# Patient Record
Sex: Male | Born: 1935 | Race: White | Hispanic: No | Smoking: Never smoker
Health system: Southern US, Community
[De-identification: ages and names within clinical notes are randomized; demographics above are authoritative.]

## PROBLEM LIST (undated history)

## (undated) DIAGNOSIS — D649 Anemia, unspecified: Secondary | ICD-10-CM

## (undated) DIAGNOSIS — T7840XA Allergy, unspecified, initial encounter: Secondary | ICD-10-CM

## (undated) DIAGNOSIS — Z87442 Personal history of urinary calculi: Secondary | ICD-10-CM

## (undated) DIAGNOSIS — H3530 Unspecified macular degeneration: Secondary | ICD-10-CM

## (undated) DIAGNOSIS — N419 Inflammatory disease of prostate, unspecified: Secondary | ICD-10-CM

## (undated) DIAGNOSIS — M1990 Unspecified osteoarthritis, unspecified site: Secondary | ICD-10-CM

## (undated) DIAGNOSIS — I493 Ventricular premature depolarization: Secondary | ICD-10-CM

## (undated) DIAGNOSIS — K5732 Diverticulitis of large intestine without perforation or abscess without bleeding: Secondary | ICD-10-CM

## (undated) DIAGNOSIS — I119 Hypertensive heart disease without heart failure: Secondary | ICD-10-CM

## (undated) DIAGNOSIS — Z8619 Personal history of other infectious and parasitic diseases: Secondary | ICD-10-CM

## (undated) DIAGNOSIS — I10 Essential (primary) hypertension: Secondary | ICD-10-CM

## (undated) DIAGNOSIS — C801 Malignant (primary) neoplasm, unspecified: Secondary | ICD-10-CM

## (undated) DIAGNOSIS — R011 Cardiac murmur, unspecified: Secondary | ICD-10-CM

## (undated) DIAGNOSIS — Z8719 Personal history of other diseases of the digestive system: Secondary | ICD-10-CM

## (undated) DIAGNOSIS — K56609 Unspecified intestinal obstruction, unspecified as to partial versus complete obstruction: Secondary | ICD-10-CM

## (undated) DIAGNOSIS — I951 Orthostatic hypotension: Secondary | ICD-10-CM

## (undated) DIAGNOSIS — N189 Chronic kidney disease, unspecified: Secondary | ICD-10-CM

## (undated) DIAGNOSIS — Z85828 Personal history of other malignant neoplasm of skin: Secondary | ICD-10-CM

## (undated) DIAGNOSIS — K5730 Diverticulosis of large intestine without perforation or abscess without bleeding: Secondary | ICD-10-CM

## (undated) DIAGNOSIS — B029 Zoster without complications: Secondary | ICD-10-CM

## (undated) DIAGNOSIS — R079 Chest pain, unspecified: Secondary | ICD-10-CM

## (undated) DIAGNOSIS — R1032 Left lower quadrant pain: Secondary | ICD-10-CM

## (undated) DIAGNOSIS — R002 Palpitations: Secondary | ICD-10-CM

## (undated) DIAGNOSIS — A0471 Enterocolitis due to Clostridium difficile, recurrent: Secondary | ICD-10-CM

## (undated) DIAGNOSIS — I351 Nonrheumatic aortic (valve) insufficiency: Secondary | ICD-10-CM

## (undated) DIAGNOSIS — H3560 Retinal hemorrhage, unspecified eye: Secondary | ICD-10-CM

## (undated) DIAGNOSIS — K219 Gastro-esophageal reflux disease without esophagitis: Secondary | ICD-10-CM

## (undated) DIAGNOSIS — F32A Depression, unspecified: Secondary | ICD-10-CM

## (undated) DIAGNOSIS — D126 Benign neoplasm of colon, unspecified: Secondary | ICD-10-CM

## (undated) DIAGNOSIS — I491 Atrial premature depolarization: Secondary | ICD-10-CM

## (undated) DIAGNOSIS — E785 Hyperlipidemia, unspecified: Secondary | ICD-10-CM

## (undated) DIAGNOSIS — R739 Hyperglycemia, unspecified: Secondary | ICD-10-CM

## (undated) DIAGNOSIS — R091 Pleurisy: Secondary | ICD-10-CM

## (undated) HISTORY — DX: Benign neoplasm of colon, unspecified: D12.6

## (undated) HISTORY — DX: Diverticulitis of large intestine without perforation or abscess without bleeding: K57.32

## (undated) HISTORY — DX: Depression, unspecified: F32.A

## (undated) HISTORY — DX: Zoster without complications: B02.9

## (undated) HISTORY — PX: OTHER SURGICAL HISTORY: SHX169

## (undated) HISTORY — DX: Hyperlipidemia, unspecified: E78.5

## (undated) HISTORY — DX: Personal history of other diseases of the digestive system: Z87.19

## (undated) HISTORY — DX: Allergy, unspecified, initial encounter: T78.40XA

## (undated) HISTORY — DX: Chronic kidney disease, unspecified: N18.9

## (undated) HISTORY — DX: Inflammatory disease of prostate, unspecified: N41.9

## (undated) HISTORY — PX: COLON SURGERY: SHX602

## (undated) HISTORY — DX: Enterocolitis due to Clostridium difficile, recurrent: A04.71

## (undated) HISTORY — DX: Pleurisy: R09.1

## (undated) HISTORY — DX: Atrial premature depolarization: I49.1

## (undated) HISTORY — DX: Palpitations: R00.2

## (undated) HISTORY — PX: LITHOTRIPSY: SUR834

## (undated) HISTORY — DX: Retinal hemorrhage, unspecified eye: H35.60

## (undated) HISTORY — PX: TONSILLECTOMY: SUR1361

## (undated) HISTORY — DX: Nonrheumatic aortic (valve) insufficiency: I35.1

## (undated) HISTORY — DX: Hyperglycemia, unspecified: R73.9

## (undated) HISTORY — DX: Essential (primary) hypertension: I10

## (undated) HISTORY — DX: Hypertensive heart disease without heart failure: I11.9

## (undated) HISTORY — DX: Unspecified intestinal obstruction, unspecified as to partial versus complete obstruction: K56.609

## (undated) HISTORY — PX: EYE SURGERY: SHX253

## (undated) HISTORY — DX: Ventricular premature depolarization: I49.3

## (undated) HISTORY — DX: Diverticulosis of large intestine without perforation or abscess without bleeding: K57.30

## (undated) HISTORY — DX: Unspecified osteoarthritis, unspecified site: M19.90

## (undated) HISTORY — PX: COLONOSCOPY: SHX174

## (undated) HISTORY — DX: Left lower quadrant pain: R10.32

## (undated) HISTORY — DX: Orthostatic hypotension: I95.1

## (undated) HISTORY — DX: Chest pain, unspecified: R07.9

---

## 1998-10-08 ENCOUNTER — Other Ambulatory Visit: Admission: RE | Admit: 1998-10-08 | Discharge: 1998-10-08 | Payer: Self-pay | Admitting: Gastroenterology

## 1998-10-08 ENCOUNTER — Encounter (INDEPENDENT_AMBULATORY_CARE_PROVIDER_SITE_OTHER): Payer: Self-pay | Admitting: Specialist

## 1999-03-21 DIAGNOSIS — D126 Benign neoplasm of colon, unspecified: Secondary | ICD-10-CM

## 1999-03-21 HISTORY — DX: Benign neoplasm of colon, unspecified: D12.6

## 1999-11-30 ENCOUNTER — Encounter (INDEPENDENT_AMBULATORY_CARE_PROVIDER_SITE_OTHER): Payer: Self-pay | Admitting: Specialist

## 1999-11-30 ENCOUNTER — Other Ambulatory Visit: Admission: RE | Admit: 1999-11-30 | Discharge: 1999-11-30 | Payer: Self-pay | Admitting: Gastroenterology

## 2001-03-29 ENCOUNTER — Emergency Department (HOSPITAL_COMMUNITY): Admission: EM | Admit: 2001-03-29 | Discharge: 2001-03-30 | Payer: Self-pay | Admitting: Emergency Medicine

## 2002-01-22 ENCOUNTER — Encounter (INDEPENDENT_AMBULATORY_CARE_PROVIDER_SITE_OTHER): Payer: Self-pay | Admitting: *Deleted

## 2002-01-22 ENCOUNTER — Ambulatory Visit (HOSPITAL_BASED_OUTPATIENT_CLINIC_OR_DEPARTMENT_OTHER): Admission: RE | Admit: 2002-01-22 | Discharge: 2002-01-22 | Payer: Self-pay | Admitting: *Deleted

## 2002-04-07 ENCOUNTER — Emergency Department (HOSPITAL_COMMUNITY): Admission: EM | Admit: 2002-04-07 | Discharge: 2002-04-07 | Payer: Self-pay | Admitting: Emergency Medicine

## 2003-03-21 DIAGNOSIS — K5730 Diverticulosis of large intestine without perforation or abscess without bleeding: Secondary | ICD-10-CM

## 2003-03-21 HISTORY — DX: Diverticulosis of large intestine without perforation or abscess without bleeding: K57.30

## 2005-05-08 ENCOUNTER — Ambulatory Visit: Payer: Self-pay | Admitting: Gastroenterology

## 2008-09-12 ENCOUNTER — Emergency Department (HOSPITAL_COMMUNITY): Admission: EM | Admit: 2008-09-12 | Discharge: 2008-09-12 | Payer: Self-pay | Admitting: Emergency Medicine

## 2008-09-13 ENCOUNTER — Emergency Department (HOSPITAL_COMMUNITY): Admission: EM | Admit: 2008-09-13 | Discharge: 2008-09-13 | Payer: Self-pay | Admitting: Emergency Medicine

## 2008-09-17 ENCOUNTER — Inpatient Hospital Stay (HOSPITAL_COMMUNITY): Admission: EM | Admit: 2008-09-17 | Discharge: 2008-09-18 | Payer: Self-pay | Admitting: Emergency Medicine

## 2008-09-18 ENCOUNTER — Encounter: Payer: Self-pay | Admitting: Urology

## 2010-02-07 ENCOUNTER — Emergency Department (HOSPITAL_COMMUNITY): Admission: EM | Admit: 2010-02-07 | Discharge: 2010-02-07 | Payer: Self-pay | Admitting: Emergency Medicine

## 2010-04-06 ENCOUNTER — Encounter: Payer: Self-pay | Admitting: Gastroenterology

## 2010-04-21 NOTE — Letter (Signed)
Summary: Colonoscopy Letter  [facility]  [address] [place], [place] [address]   Phone: [phone]  Fax: [phone]      April 06, 2010 MRN: [id number]   [facility] [address] [place], [place]  [address]   Dear Mr. NOVOSEL,   According to your medical record, it is time for you to schedule a Colonoscopy. The American Cancer Society recommends this procedure as a method to detect early colon cancer. Patients with a family history of colon cancer, or a personal history of colon polyps or inflammatory bowel disease are at increased risk.  This letter has been generated based on the recommendations made at the time of your procedure. If you feel that in your particular situation this may no longer apply, please contact our office.  Please call our office at [phone] to schedule this appointment or to update your records at your earliest convenience.  Thank you for cooperating with [place] to provide you with the very best care possible.   Sincerely,  Barbette Hair. Arlyce Dice, M.D.  [facility] [phone]

## 2010-05-31 LAB — BASIC METABOLIC PANEL
Calcium: 9 mg/dL (ref 8.4–10.5)
GFR calc Af Amer: >60 mL/min (ref >60)
GFR calc non Af Amer: >60 mL/min (ref >60)
Glucose, Bld: 91 mg/dL (ref 70–99)
Potassium: 3.9 mEq/L (ref 3.5–5.1)
Sodium: 138 mEq/L (ref 135–145)

## 2010-05-31 LAB — DIFFERENTIAL
Basophils Absolute: 0 10*3/uL (ref 0.0–0.1)
Basophils Relative: 1 % (ref 0–1)
Eosinophils Absolute: 0.1 10*3/uL (ref 0.0–0.7)
Monocytes Relative: 6 % (ref 3–12)
Neutro Abs: 5.5 10*3/uL (ref 1.7–7.7)
Neutrophils Relative %: 70 % (ref 43–77)

## 2010-05-31 LAB — CBC
HCT: 42 % (ref 39.0–52.0)
Hemoglobin: 14.5 g/dL (ref 13.0–17.0)
RBC: 4.76 MIL/uL (ref 4.22–5.81)

## 2010-06-26 LAB — BASIC METABOLIC PANEL
BUN: 17 mg/dL (ref 6–23)
CO2: 27 mEq/L (ref 19–32)
Calcium: 8.3 mg/dL — ABNORMAL LOW (ref 8.4–10.5)
Chloride: 102 mEq/L (ref 96–112)
Creatinine, Ser: 1.87 mg/dL — ABNORMAL HIGH (ref 0.4–1.5)
GFR calc Af Amer: 43 mL/min — ABNORMAL LOW (ref >60)
GFR calc non Af Amer: 36 mL/min — ABNORMAL LOW (ref >60)
Glucose, Bld: 114 mg/dL — ABNORMAL HIGH (ref 70–99)
Potassium: 3.8 mEq/L (ref 3.5–5.1)
Sodium: 137 mEq/L (ref 135–145)

## 2010-06-26 LAB — CULTURE, BLOOD (ROUTINE X 2)

## 2010-06-26 LAB — HEMOGLOBIN AND HEMATOCRIT, BLOOD
HCT: 35.9 % — ABNORMAL LOW (ref 39.0–52.0)
Hemoglobin: 12.6 g/dL — ABNORMAL LOW (ref 13.0–17.0)

## 2010-06-27 LAB — URINE MICROSCOPIC-ADD ON

## 2010-06-27 LAB — DIFFERENTIAL
Basophils Absolute: 0.3 10*3/uL — ABNORMAL HIGH (ref 0.0–0.1)
Eosinophils Absolute: 0.2 10*3/uL (ref 0.0–0.7)
Lymphocytes Relative: 7 % — ABNORMAL LOW (ref 12–46)
Lymphs Abs: 0.6 10*3/uL — ABNORMAL LOW (ref 0.7–4.0)
Lymphs Abs: 2.8 10*3/uL (ref 0.7–4.0)
Monocytes Relative: 8 % (ref 3–12)
Neutro Abs: 8.1 10*3/uL — ABNORMAL HIGH (ref 1.7–7.7)
Neutro Abs: 3.8 10*3/uL (ref 1.7–7.7)
Neutrophils Relative %: 51 % (ref 43–77)

## 2010-06-27 LAB — CBC
Hemoglobin: 14.3 g/dL (ref 13.0–17.0)
MCV: 91.2 fL (ref 78.0–100.0)
Platelets: 139 10*3/uL — ABNORMAL LOW (ref 150–400)
RBC: 5.11 MIL/uL (ref 4.22–5.81)
RDW: 13.4 % (ref 11.5–15.5)
WBC: 9.6 10*3/uL (ref 4.0–10.5)
WBC: 7.4 10*3/uL (ref 4.0–10.5)

## 2010-06-27 LAB — PROTIME-INR
INR: 1 (ref 0.00–1.49)
Prothrombin Time: 13.5 seconds (ref 11.6–15.2)

## 2010-06-27 LAB — URINALYSIS, ROUTINE W REFLEX MICROSCOPIC
Glucose, UA: NEGATIVE mg/dL
Leukocytes, UA: NEGATIVE
Nitrite: NEGATIVE
Protein, ur: NEGATIVE mg/dL
Specific Gravity, Urine: 1.024 (ref 1.005–1.030)
Specific Gravity, Urine: 1.021 (ref 1.005–1.030)
Urobilinogen, UA: 1 mg/dL (ref 0.0–1.0)

## 2010-06-27 LAB — POCT I-STAT, CHEM 8
Calcium, Ion: 1.09 mmol/L — ABNORMAL LOW (ref 1.12–1.32)
Chloride: 102 mEq/L (ref 96–112)
HCT: 41 % (ref 39.0–52.0)
Potassium: 3.9 mEq/L (ref 3.5–5.1)
Sodium: 140 mEq/L (ref 135–145)

## 2010-06-27 LAB — URINE CULTURE

## 2010-06-27 LAB — BASIC METABOLIC PANEL
Chloride: 100 mEq/L (ref 96–112)
Creatinine, Ser: 1.07 mg/dL (ref 0.4–1.5)
GFR calc Af Amer: >60 mL/min (ref >60)
Sodium: 142 mEq/L (ref 135–145)

## 2010-08-02 NOTE — Op Note (Signed)
NAMEALLIN, FRIX               ACCOUNT NO.:  [id number]   MEDICAL RECORD NO.:  [id number]          PATIENT TYPE:  INP   LOCATION:  1401                         FACILITY:  [facility]   PHYSICIAN:  Mark C. Vernie Ammons, M.D.  DATE OF BIRTH:  October 31, 1933   DATE OF PROCEDURE:  09/18/2008  DATE OF DISCHARGE:  09/18/2008                               OPERATIVE REPORT   PREOPERATIVE DIAGNOSIS:  Right ureteral calculus.   POSTOPERATIVE DIAGNOSIS:  Right ureteral calculus.   PROCEDURE:  1. Right ureteroscopy.  2. Right ureteral stone extraction.  3. Right double-J stent placement.   SURGEON:  Mark C. Vernie Ammons, M.D.   Threasa HeadsPeggye Pitt.   ANESTHESIA:  General.   BLOOD LOSS:  Less than 5 mL.   DRAINS/:  A 6-French, 24-cm Polaris stent with string placed in the  right ureter.   SPECIMENS:  Stone given to the patient.   COMPLICATIONS:  None.   INDICATIONS:  The patient is a 77 year old male who had a stone located  at the level of the L3 vertebral body for 2 weeks without significant  progression.  He was seen in my office and scheduled for ureteroscopy,  but the day that he was scheduled for that he developed acute renal  colic and required admission to the hospital for pain control.  He  remained in the hospital overnight and was discharged so that he could  come and have his stone removed.  During his hospitalization he did have  a temperature 102 however, his urine was completely clear of any white  cells on admission.  He did receive broad spectrum antibiotics and I  have discussed the procedure with him, as well as its risks,  complications and alternatives.  He understands and has elected to  proceed.   DESCRIPTION OF OPERATION:  After informed consent, the patient was  brought to the major OR, placed on the table, administered general  anesthesia then moved to the dorsal lithotomy position.  His genitalia  were sterilely prepped and draped.  An official time-out was then  performed.   A 22-French cystoscope was then passed with a 12 degrees lens per  urethra which was noted to be entirely normal down to the sphincter  which appears intact.  The prostatic urethra revealed elongation of the  prostatic urethra and hypertrophy of the lateral lobes, as well as a  large median lobe component.  There were no prostatic urethral lesions  identified.  Upon entering the bladder, it was fully and systematically  inspected and no tumor, stones or inflammatory lesions were identified.  The left orifice was identified and appeared normal.  The right orifice  appeared mounded up somewhat and slightly edematous.   The right ureteral orifice was identified and a 6-French open-ended  ureteral catheter was passed through the cystoscope and into the  orifice, but I could not get it passed what appeared to be an  obstructing stone in the intramural ureter.  I tried to pass the  Glidewire and was unable to get this to go passed the stone.   The cystoscope was removed  and the 6-French rigid ureteroscope was then  passed into the bladder and was placed at the right ureteral orifice  opening.  I then passed the guidewire through the ureteroscope and was  easily able to pass this on up into the area of the renal pelvis under  direct fluoroscopic control.   The ureteroscope was removed and the inner portion of the ureteral  access sheath was passed over the guidewire to dilate the intramural  ureter.  I then removed that and left the guidewire in place and next to  the guidewire I passed the 6-French rigid ureteroscope and was able to  identify the stone in the distal ureter.  It was grasped with a nitinol  basket and extracted.   The cystoscope was then back loaded over the guidewire and the stent was  then passed over the guidewire with the guidewire being removed with  good curl being noted in the renal pelvis.  The string was left affixed  to the stent and affixed to the  dorsum of the penis.  The patient  received a B and O suppository and was awakened and taken to the  recovery room in stable and satisfactory condition.  He tolerated the  procedure well and there were no intraoperative complications.   He will be given a prescription for Tylox #36 and Cipro 500 mg b.i.d.  #14.  He will return to my office on October 05, 2008, for stent removal  and written instructions were given upon discharge.      Mark C. Vernie Ammons, M.D.  Electronically Signed     MCO/MEDQ  D:  09/18/2008  T:  09/18/2008  Job:  [id number]

## 2010-08-05 NOTE — Op Note (Signed)
NAME:  Joel Bauer, Joel Bauer                         ACCOUNT NO.:  [id number]   MEDICAL RECORD NO.:  [id number]                   PATIENT TYPE:  AMB   LOCATION:  DSC                                  FACILITY:  [facility]   PHYSICIAN:  Lowell Bouton, M.D.      DATE OF BIRTH:  1933-11-01   DATE OF PROCEDURE:  01/22/2002  DATE OF DISCHARGE:                                 OPERATIVE REPORT   PREOPERATIVE DIAGNOSIS:  Dupuytren's contracture, right ring finger.   POSTOPERATIVE DIAGNOSIS:  Dupuytren's contracture, right ring finger.   OPERATION PERFORMED:  Release of Dupuytren's contracture, right ring finger.   SURGEON:  Lowell Bouton, M.D.   ANESTHESIA:  General.   OPERATIVE FINDINGS:  The patient had a midline longitudinal cord with a  large nodule over the volar aspect of the MP joint.  The cord extended out  to just beyond the PIP joint.  The contracture at the MP was about 45  degrees and at the PIP was approximately 15 degrees.  There were some  retrovascular cords out distally.   DESCRIPTION OF PROCEDURE:  Under general anesthesia with a tourniquet on the  right arm, the right hand was prepped and draped in the usual fashion.  After exsanguinating the limb, the tourniquet was inflated to 250 mmHg.  A  Brunner zigzag incision was made from DIP crease back to the proximal palm  in line with the ring finger.  The dissection was begun proximally and the  flaps were elevated from the cord sharply.  4-0 silk retention sutures were  inserted for retraction.  The midline cord was then transected at the  proximal palm and then grasped with an Allis clamp.  It was then traced  distally out to the MP joint.  The neurovascular bundles were identified  radially and ulnarly and protected.  The flaps were then elevated more  distally after dissecting the nerves completely away from the cord.  The  cord extended over toward the middle finger but did not involve the middle  finger.  It went to the web between the third and fourth digit.  After  completely dissecting the cord out, it was removed and then the  retrovascular cords were removed both radially and ulnarly at the PIP level.  The wound was then irrigated copiously with saline.  The finger appeared to  be straight.  The skin was closed over a vessel loop drain using 4-0  nylon sutures.  Sterile dressings were applied followed by a volar splint  with the fingers in full extension. The patient tolerated the procedure  well.  0.5% Marcaine local anesthesia was inserted for pain control  postoperatively.  He went to the recovery room awake and stable in good  condition.  Lowell Bouton, M.D.    EMM/MEDQ  D:  01/22/2002  T:  01/23/2002  Job:  [id number]   cc:   Elvina Sidle, M.D.  [address] [place]  [place] [address]  Fax: [phone]

## 2011-05-03 ENCOUNTER — Other Ambulatory Visit: Payer: Self-pay | Admitting: Dermatology

## 2011-12-04 ENCOUNTER — Encounter: Payer: Self-pay | Admitting: Gastroenterology

## 2012-06-24 ENCOUNTER — Telehealth: Payer: Self-pay | Admitting: Family Medicine

## 2012-06-24 MED ORDER — LOSARTAN POTASSIUM 100 MG PO TABS: 100.0000 mg | ORAL_TABLET | Freq: Every day | ORAL | Status: DC

## 2012-06-24 NOTE — Telephone Encounter (Signed)
rx refilled.

## 2012-09-10 ENCOUNTER — Ambulatory Visit (INDEPENDENT_AMBULATORY_CARE_PROVIDER_SITE_OTHER): Payer: Medicare Other | Admitting: Family Medicine

## 2012-09-10 ENCOUNTER — Encounter: Payer: Self-pay | Admitting: Family Medicine

## 2012-09-10 VITALS — BP 138/80 | HR 58 | Temp 97.3°F | Resp 16 | Ht 71.0 in | Wt 173.0 lb

## 2012-09-10 DIAGNOSIS — K5732 Diverticulitis of large intestine without perforation or abscess without bleeding: Secondary | ICD-10-CM

## 2012-09-10 DIAGNOSIS — R109 Unspecified abdominal pain: Secondary | ICD-10-CM

## 2012-09-10 DIAGNOSIS — K5792 Diverticulitis of intestine, part unspecified, without perforation or abscess without bleeding: Secondary | ICD-10-CM | POA: Insufficient documentation

## 2012-09-10 LAB — CBC W/MCH & 3 PART DIFF
Hemoglobin: 15.5 g/dL (ref 13.0–17.0)
Lymphs Abs: 1.5 10*3/uL (ref 0.7–4.0)
MCH: 31.5 pg (ref 26.0–34.0)
MCV: 89.2 fL (ref 78.0–100.0)
Neutrophils Relative %: 74 % (ref 43–77)
RBC: 4.92 MIL/uL (ref 4.22–5.81)
WBC mixed population %: 8 % (ref 3–18)

## 2012-09-10 LAB — URINALYSIS, ROUTINE W REFLEX MICROSCOPIC
Bilirubin Urine: NEGATIVE
Glucose, UA: NEGATIVE mg/dL
Ketones, ur: NEGATIVE mg/dL
Specific Gravity, Urine: >1.03 — ABNORMAL HIGH (ref 1.005–1.030)
Urobilinogen, UA: 0.2 mg/dL (ref 0.0–1.0)

## 2012-09-10 LAB — COMPREHENSIVE METABOLIC PANEL
AST: 20 U/L (ref 0–37)
Albumin: 4.2 g/dL (ref 3.5–5.2)
Alkaline Phosphatase: 72 U/L (ref 39–117)
BUN: 17 mg/dL (ref 6–23)
Potassium: 4.5 mEq/L (ref 3.5–5.3)
Sodium: 141 mEq/L (ref 135–145)

## 2012-09-10 MED ORDER — METRONIDAZOLE 500 MG PO TABS: 500.0000 mg | ORAL_TABLET | Freq: Three times a day (TID) | ORAL | Status: DC

## 2012-09-10 MED ORDER — CIPROFLOXACIN HCL 500 MG PO TABS: 500.0000 mg | ORAL_TABLET | Freq: Two times a day (BID) | ORAL | Status: DC

## 2012-09-10 NOTE — Assessment & Plan Note (Signed)
We'll treat for flare of diverticulitis based on worsening abdominal pain and patient's history and symptoms. Urinalysis was negative CBC was done in-house was unremarkable metabolic panel is still pending. I will start him on Cipro Flagyl and given handout. Liquid diet for next 2448 hours we'll see if he improves consider doing CT scan if worsens

## 2012-09-10 NOTE — Progress Notes (Signed)
  Subjective:    Patient ID: Joel Bauer, male    DOB: 07/01/1933, 79 y.o.   MRN: [id number]  HPI  Progressively worsening Abdominal pain for the past week, mostly in lower quadrants, no radiation, no relieving factors, no OTC medication tried.  denies dysuria, Nausea/vomiting, change in stools. Denies blood in stools.  History of diverticulitis years ago, states it feels similar.   Review of Systems  - per above  GEN- denies fatigue, fever, weight loss,weakness, recent illness HEENT- denies eye drainage, change in vision, nasal discharge, CVS- denies chest pain, palpitations RESP- denies SOB, cough, wheeze ABD- denies N/V, change in stools,+ abd pain GU- denies dysuria, hematuria, dribbling, incontinence        Objective:   Physical Exam  GEN- NAD, alert and oriented x3 HEENT- PERRL, EOMI, non injected sclera, pink conjunctiva, MMM, oropharynx clear CVS- RRR, no murmur RESP-CTAB ABD-NABS,soft,Mild TTP lower quandrants, TTP suprapubic region, no rebound, no guarding, no CVA tenderness EXT- No edema Pulses- Radial, DP- 2+       Assessment & Plan:

## 2012-09-10 NOTE — Patient Instructions (Addendum)
Drink fluids We will call with results of labs Antibiotics given Diverticulitis A diverticulum is a small pouch or sac on the colon. Diverticulosis is the presence of these diverticula on the colon. Diverticulitis is the irritation (inflammation) or infection of diverticula. CAUSES  The colon and its diverticula contain bacteria. If food particles block the tiny opening to a diverticulum, the bacteria inside can grow and cause an increase in pressure. This leads to infection and inflammation and is called diverticulitis. SYMPTOMS   Abdominal pain and tenderness. Usually, the pain is located on the left side of your abdomen. However, it could be located elsewhere.  Fever.  Bloating.  Feeling sick to your stomach (nausea).  Throwing up (vomiting).  Abnormal stools. DIAGNOSIS  Your caregiver will take a history and perform a physical exam. Since many things can cause abdominal pain, other tests may be necessary. Tests may include:  Blood tests.  Urine tests.  X-ray of the abdomen.  CT scan of the abdomen. Sometimes, surgery is needed to determine if diverticulitis or other conditions are causing your symptoms. TREATMENT  Most of the time, you can be treated without surgery. Treatment includes:  Resting the bowels by only having liquids for a few days. As you improve, you will need to eat a low-fiber diet.  Intravenous (IV) fluids if you are losing body fluids (dehydrated).  Antibiotic medicines that treat infections may be given.  Pain and nausea medicine, if needed.  Surgery if the inflamed diverticulum has burst. HOME CARE INSTRUCTIONS   Try a clear liquid diet (broth, tea, or water for as long as directed by your caregiver). You may then gradually begin a low-fiber diet as tolerated.  A low-fiber diet is a diet with less than 10 grams of fiber. Choose the foods below to reduce fiber in the diet:  White breads, cereals, rice, and pasta.  Cooked fruits and vegetables  or soft fresh fruits and vegetables without the skin.  Ground or well-cooked tender beef, ham, veal, lamb, pork, or poultry.  Eggs and seafood.  After your diverticulitis symptoms have improved, your caregiver may put you on a high-fiber diet. A high-fiber diet includes 14 grams of fiber for every 1000 calories consumed. For a standard 2000 calorie diet, you would need 28 grams of fiber. Follow these diet guidelines to help you increase the fiber in your diet. It is important to slowly increase the amount fiber in your diet to avoid gas, constipation, and bloating.  Choose whole-grain breads, cereals, pasta, and brown rice.  Choose fresh fruits and vegetables with the skin on. Do not overcook vegetables because the more vegetables are cooked, the more fiber is lost.  Choose more nuts, seeds, legumes, dried peas, beans, and lentils.  Look for food products that have greater than 3 grams of fiber per serving on the Nutrition Facts label.  Take all medicine as directed by your caregiver.  If your caregiver has given you a follow-up appointment, it is very important that you go. Not going could result in lasting (chronic) or permanent injury, pain, and disability. If there is any problem keeping the appointment, call to reschedule. SEEK MEDICAL CARE IF:   Your pain does not improve.  You have a hard time advancing your diet beyond clear liquids.  Your bowel movements do not return to normal. SEEK IMMEDIATE MEDICAL CARE IF:   Your pain becomes worse.  You have an oral temperature above 102 F (38.9 C), not controlled by medicine.  You have  repeated vomiting.  You have bloody or black, tarry stools.  Symptoms that brought you to your caregiver become worse or are not getting better. MAKE SURE YOU:   Understand these instructions.  Will watch your condition.  Will get help right away if you are not doing well or get worse. Document Released: 12/14/2004 Document Revised:  05/29/2011 Document Reviewed: 04/11/2010 [facility] Patient Information 2014 [place], [place].

## 2012-09-16 ENCOUNTER — Encounter: Payer: Self-pay | Admitting: Family Medicine

## 2012-09-16 ENCOUNTER — Ambulatory Visit (INDEPENDENT_AMBULATORY_CARE_PROVIDER_SITE_OTHER): Payer: Medicare Other | Admitting: Family Medicine

## 2012-09-16 VITALS — BP 128/70 | HR 60 | Temp 97.1°F | Resp 16 | Ht 71.0 in | Wt 168.0 lb

## 2012-09-16 DIAGNOSIS — K5792 Diverticulitis of intestine, part unspecified, without perforation or abscess without bleeding: Secondary | ICD-10-CM

## 2012-09-16 DIAGNOSIS — R1032 Left lower quadrant pain: Secondary | ICD-10-CM

## 2012-09-16 DIAGNOSIS — K5732 Diverticulitis of large intestine without perforation or abscess without bleeding: Secondary | ICD-10-CM

## 2012-09-16 NOTE — Patient Instructions (Addendum)
CT scan to be done on Wed  We will call with results

## 2012-09-16 NOTE — Assessment & Plan Note (Signed)
Obtain CT scan, no improvement with antibiotics and change in diet Evaluate for abscess, mass, inflammation etc

## 2012-09-16 NOTE — Progress Notes (Signed)
  Subjective:    Patient ID: Joel Bauer, male    DOB: 03-27-1933, 79 y.o.   MRN: [id number]  HPI  Pt here with continued abdominal pain. He was seen last week secondary to left lower quadrant pain similar to his diverticulitis flare he was started on Cipro and Flagyl he did a liquid diet for 72 hours and slowly added back regular foods however he saw no change with the pain. He had one day where he had no pain however it returned worse last night. Noticed his stools were darker than usual, no BRBPR. Denies vomiting, mild nausea  Review of Systems   GEN- denies fatigue, fever, weight loss,weakness, recent illness HEENT- denies eye drainage, change in vision, nasal discharge, CVS- denies chest pain, palpitations RESP- denies SOB, cough, wheeze ABD- denies N/V, change in stools+, abd pain       Objective:   Physical Exam  GEN- NAD, alert and oriented x3 CVS- RRR, no murmur RESP-CTAB ABD-NABS,soft,TTP LLQ, no rebound, no guarding, no mass felt, no CVA tenderness EXT- No edema Rectum- external skin tags, FOBT neg, prostate smooth, no nodules Pulses- Radial2+       Assessment & Plan:

## 2012-09-16 NOTE — Assessment & Plan Note (Signed)
Continue LLQ abd pain, labs unremarkable but pt not improving, FOBT neg, obtain CT abd/pelvis  Evaluate bowels, oral contrast, complete antibiotics

## 2012-09-18 ENCOUNTER — Ambulatory Visit
Admission: RE | Admit: 2012-09-18 | Discharge: 2012-09-18 | Disposition: A | Discharge status: Home / Self Care | Payer: Medicare Other | Source: Ambulatory Visit | Attending: Family Medicine | Admitting: Family Medicine

## 2012-09-18 DIAGNOSIS — R1032 Left lower quadrant pain: Secondary | ICD-10-CM

## 2012-09-18 MED ORDER — IOHEXOL 300 MG/ML  SOLN
100.0000 mL | Freq: Once | INTRAMUSCULAR | Status: AC | PRN
  Administered 2012-09-18: 100 mL via INTRAVENOUS

## 2012-10-03 ENCOUNTER — Encounter: Payer: Self-pay | Admitting: Nurse Practitioner

## 2012-10-03 ENCOUNTER — Emergency Department (HOSPITAL_COMMUNITY): Payer: PRIVATE HEALTH INSURANCE

## 2012-10-03 ENCOUNTER — Emergency Department (HOSPITAL_COMMUNITY)
Admission: EM | Admit: 2012-10-03 | Discharge: 2012-10-03 | Disposition: A | Discharge status: Home / Self Care | Payer: PRIVATE HEALTH INSURANCE | Attending: Emergency Medicine | Admitting: Emergency Medicine

## 2012-10-03 ENCOUNTER — Encounter (HOSPITAL_COMMUNITY): Payer: Self-pay | Admitting: Cardiology

## 2012-10-03 DIAGNOSIS — Z860100 Personal history of colon polyps, unspecified: Secondary | ICD-10-CM | POA: Insufficient documentation

## 2012-10-03 DIAGNOSIS — E785 Hyperlipidemia, unspecified: Secondary | ICD-10-CM | POA: Insufficient documentation

## 2012-10-03 DIAGNOSIS — K5792 Diverticulitis of intestine, part unspecified, without perforation or abscess without bleeding: Secondary | ICD-10-CM

## 2012-10-03 DIAGNOSIS — K5732 Diverticulitis of large intestine without perforation or abscess without bleeding: Secondary | ICD-10-CM | POA: Insufficient documentation

## 2012-10-03 DIAGNOSIS — I10 Essential (primary) hypertension: Secondary | ICD-10-CM | POA: Insufficient documentation

## 2012-10-03 DIAGNOSIS — Z8719 Personal history of other diseases of the digestive system: Secondary | ICD-10-CM | POA: Insufficient documentation

## 2012-10-03 DIAGNOSIS — Z79899 Other long term (current) drug therapy: Secondary | ICD-10-CM | POA: Insufficient documentation

## 2012-10-03 LAB — CBC WITH DIFFERENTIAL/PLATELET
Hemoglobin: 15.4 g/dL (ref 13.0–17.0)
Lymphs Abs: 0.8 10*3/uL (ref 0.7–4.0)
Monocytes Relative: 7 % (ref 3–12)
Neutro Abs: 7.9 10*3/uL — ABNORMAL HIGH (ref 1.7–7.7)
Neutrophils Relative %: 83 % — ABNORMAL HIGH (ref 43–77)
RBC: 4.88 MIL/uL (ref 4.22–5.81)

## 2012-10-03 LAB — COMPREHENSIVE METABOLIC PANEL
Alkaline Phosphatase: 74 U/L (ref 39–117)
BUN: 17 mg/dL (ref 6–23)
Chloride: 102 mEq/L (ref 96–112)
GFR calc Af Amer: >90 mL/min (ref >90)
Glucose, Bld: 95 mg/dL (ref 70–99)
Potassium: 3.9 mEq/L (ref 3.5–5.1)
Total Bilirubin: 0.6 mg/dL (ref 0.3–1.2)

## 2012-10-03 LAB — URINALYSIS, ROUTINE W REFLEX MICROSCOPIC
Bilirubin Urine: NEGATIVE
Ketones, ur: NEGATIVE mg/dL
Nitrite: NEGATIVE
Urobilinogen, UA: 0.2 mg/dL (ref 0.0–1.0)
pH: 7 (ref 5.0–8.0)

## 2012-10-03 LAB — URINE MICROSCOPIC-ADD ON

## 2012-10-03 LAB — LIPASE, BLOOD: Lipase: 26 U/L (ref 11–59)

## 2012-10-03 MED ORDER — OXYCODONE-ACETAMINOPHEN 5-325 MG PO TABS
1.0000 | ORAL_TABLET | Freq: Once | ORAL | Status: AC
  Administered 2012-10-03: 1 via ORAL
  Filled 2012-10-03: qty 1

## 2012-10-03 MED ORDER — SODIUM CHLORIDE 0.9 % IV BOLUS (SEPSIS)
1000.0000 mL | Freq: Once | INTRAVENOUS | Status: AC
  Administered 2012-10-03: 1000 mL via INTRAVENOUS

## 2012-10-03 MED ORDER — IBUPROFEN 200 MG PO TABS
600.0000 mg | ORAL_TABLET | Freq: Once | ORAL | Status: AC
  Administered 2012-10-03: 600 mg via ORAL
  Filled 2012-10-03: qty 1

## 2012-10-03 MED ORDER — IOHEXOL 300 MG/ML  SOLN
100.0000 mL | Freq: Once | INTRAMUSCULAR | Status: AC | PRN
  Administered 2012-10-03: 100 mL via INTRAVENOUS

## 2012-10-03 MED ORDER — AMOXICILLIN-POT CLAVULANATE 875-125 MG PO TABS: 1.0000 | ORAL_TABLET | Freq: Two times a day (BID) | ORAL | Status: DC

## 2012-10-03 MED ORDER — OXYCODONE-ACETAMINOPHEN 5-325 MG PO TABS: 1.0000 | ORAL_TABLET | Freq: Once | ORAL | Status: DC

## 2012-10-03 MED ORDER — IOHEXOL 300 MG/ML  SOLN
25.0000 mL | INTRAMUSCULAR | Status: AC
  Administered 2012-10-03: 25 mL via ORAL

## 2012-10-03 MED ORDER — METRONIDAZOLE 500 MG PO TABS: 500.0000 mg | ORAL_TABLET | Freq: Two times a day (BID) | ORAL | Status: DC

## 2012-10-03 NOTE — ED Notes (Signed)
Pt reports that he was dx with diverticulitis about 2 weeks ago and placed on a 2 week course of antibiotics. States that he is still experiencing llq pain and radiates into the center of his abd. Denies any urinary symptoms. States he called PCP and told to come here.

## 2012-10-03 NOTE — ED Notes (Signed)
Pt informed, waiting GI consult.

## 2012-10-03 NOTE — ED Notes (Signed)
Ct called to inform that pt is done with contrast 

## 2012-10-03 NOTE — ED Notes (Signed)
Lactic acid results shown to Dr. Yelverton 

## 2012-10-03 NOTE — ED Provider Notes (Signed)
History    CSN: [id number] Arrival date & time 10/03/12  4098  First MD Initiated Contact with Patient 10/03/12 [phone]     Chief Complaint  Patient presents with  . Abdominal Pain   (Consider location/radiation/quality/duration/timing/severity/associated sxs/prior Treatment) Patient is a 79 y.o. male presenting with abdominal pain. The history is provided by the patient.  Abdominal Pain Associated symptoms include abdominal pain. Associated symptoms comments: Joel Bauer is a 79 y.o. male h/o diverticulosis here with persistent abd pain, despite proper abx treatment x 2 weeks.  CT one month ago did not reveal diverticulitis.  Patient is currently denying N/V/D and states he will be getting GI follow up.  His pain was persistently a 6 last night without any pain medication.  He was advised to come to the ED by his PCP for further evaluation.  Patient also denies fever, chills, night sweats, GU complaints or any systemic symptoms.  He has been tolerating PO well.   Past Medical History  Diagnosis Date  . Colon polyp   . Diverticulitis   . Hypertension   . Hyperlipidemia    History reviewed. No pertinent past surgical history. History reviewed. No pertinent family history. History  Substance Use Topics  . Smoking status: Never Smoker   . Smokeless tobacco: Not on file  . Alcohol Use: Not on file    Review of Systems  Gastrointestinal: Positive for abdominal pain.  10 Systems reviewed and are negative for acute change except as noted in the HPI.   Allergies  Review of patient's allergies indicates no known allergies.  Home Medications   Current Outpatient Rx  Name  Route  Sig  Dispense  Refill  . ibuprofen (ADVIL,MOTRIN) 200 MG tablet   Oral   Take 400 mg by mouth every 6 (six) hours as needed for pain.         Marland Kitchen losartan (COZAAR) 100 MG tablet   Oral   Take 1 tablet (100 mg total) by mouth daily.   30 tablet   5    BP 149/78  Pulse 73  Temp(Src) 97.7 F  (36.5 C) (Oral)  Resp 16  SpO2 99% Physical Exam  Nursing note and vitals reviewed. Constitutional: He is oriented to person, place, and time. Vital signs are normal. He appears well-developed and well-nourished.  Non-toxic appearance. He does not appear ill. No distress.  HENT:  Head: Normocephalic and atraumatic.  Nose: Nose normal.  Mouth/Throat: Oropharynx is clear and moist. No oropharyngeal exudate.  Eyes: Conjunctivae and EOM are normal. Pupils are equal, round, and reactive to light. No scleral icterus.  Neck: Normal range of motion. Neck supple. No tracheal deviation, no edema, no erythema and normal range of motion present. No mass and no thyromegaly present.  Cardiovascular: Normal rate, regular rhythm, S1 normal, S2 normal, normal heart sounds, intact distal pulses and normal pulses.  Exam reveals no gallop and no friction rub.   No murmur heard. Pulses:      Radial pulses are 2+ on the right side, and 2+ on the left side.       Dorsalis pedis pulses are 2+ on the right side, and 2+ on the left side.  Pulmonary/Chest: Effort normal and breath sounds normal. No respiratory distress. He has no wheezes. He has no rhonchi. He has no rales.  Abdominal: Soft. Normal appearance and bowel sounds are normal. He exhibits no distension, no ascites and no mass. There is no hepatosplenomegaly. There is no tenderness. There is  no rebound, no guarding and no CVA tenderness.  Genitourinary: Penis normal. No penile tenderness.  No evidence of hernia  Musculoskeletal: Normal range of motion. He exhibits no edema and no tenderness.  Lymphadenopathy:    He has no cervical adenopathy.  Neurological: He is alert and oriented to person, place, and time. He has normal strength. No cranial nerve deficit or sensory deficit. GCS eye subscore is 4. GCS verbal subscore is 5. GCS motor subscore is 6.  Skin: Skin is warm, dry and intact. No petechiae and no rash noted. He is not diaphoretic. No erythema. No  pallor.  Psychiatric: He has a normal mood and affect. His behavior is normal. Judgment normal.    ED Course  Procedures (including critical care time) Labs Reviewed  CBC WITH DIFFERENTIAL - Abnormal; Notable for the following:    Platelets 143 (*)    Neutrophils Relative % 83 (*)    Neutro Abs 7.9 (*)    Lymphocytes Relative 9 (*)    All other components within normal limits  COMPREHENSIVE METABOLIC PANEL - Abnormal; Notable for the following:    GFR calc non Af Amer 85 (*)    All other components within normal limits  URINALYSIS, ROUTINE W REFLEX MICROSCOPIC - Abnormal; Notable for the following:    Hgb urine dipstick TRACE (*)    All other components within normal limits  LIPASE, BLOOD  URINE MICROSCOPIC-ADD ON  CG4 I-STAT (LACTIC ACID)   Ct Abdomen Pelvis W Contrast  10/03/2012   *RADIOLOGY REPORT*  Clinical Data: Abdominal pain.  CT ABDOMEN AND PELVIS WITH CONTRAST  Technique:  Multidetector CT imaging of the abdomen and pelvis was performed following the standard protocol during bolus administration of intravenous contrast.  Contrast: OMNIPAQUE IOHEXOL 300 MG/ML  SOLN  Comparison: 09/18/2012  Findings: No pleural effusion identified.  Dependent changes are noted in both lung bases.  Calcifications within the RCA and LAD coronary artery noted.  Stable enhancing structure within the inferior right hepatic lobe measuring 1.1 cm, image 39/series 2.  Presumed hemangioma is identified along the dome of the liver measuring approximately 1.7 cm, image 20/series 2.  The gallbladder is within normal limits. There is no biliary dilatation.  Normal appearance of the pancreas. The spleen is normal.  The adrenal glands are both within normal limits.  Stone is identified within the lower pole of the right kidney measuring 5 mm, image 46/series 2.  There is a small cyst arising from the upper pole of the right kidney which measures 1.1 cm, image 37/series 2.  The left kidney is normal.  The  urinary bladder appears normal.  There is prostate gland enlargement and this has mass effect upon the bladder base.  Normal caliber of the abdominal aorta.  There is calcified atherosclerotic disease.  No aneurysm.  No upper abdominal adenopathy identified.  There is no pelvic or inguinal adenopathy identified.  The stomach is normal.  The small bowel loops have a normal course and caliber.  The appendix is visualized and appears normal. Normal appearance of the proximal colon.  Extensive diverticulosis involves the sigmoid colon.  There is new wall thickening and inflammatory changes involving the sigmoid colon compatible with acute diverticulitis.  No evidence for bowel perforation or abscess formation.  No significant free fluid or abnormal fluid collections.  Review of the visualized osseous structures is significant for mild multilevel degenerative disc disease.  IMPRESSION:  1.  Exam is positive for acute diverticulitis involving the sigmoid colon.  2.  No complicating features identified.  No evidence for bowel obstruction or abscess formation. 3.  There are two enhancing lesions within the liver parenchyma which are favored to represent hemangiomas. 4.  The nonobstructing renal calculi.   Original Report Authenticated By: Signa Kell, M.D.   No diagnosis found.  Date: 10/03/2012  Rate: 69  Rhythm: normal sinus rhythm  QRS Axis: left  Intervals: normal  ST/T Wave abnormalities: normal  Conduction Disutrbances:none  Narrative Interpretation:   Old EKG Reviewed: none available    MDM  I can not find a CT confirming diverticulitis on this patient, only diverticulosis.  He otherwise appears very well in the room and tolerates PO without any problems.  We will check basic labs, treat his pain with NSAID and oxycodone.  Repeat Ct is warranted as his last one was a month ago to ensure there has not been any progression.  1040 - reeval: patient states his pain remains the same with acute flares  despite pain medication.  He is not requesting more meds at this time when offered.  Currently awaiting CT scan.  1250 - reeval: CT reveals diverticulitis.  Patient just finished 2 week course of cipro flagyl.  Will consult GI for further recs and possible quick outpt eval.  1530n - reeval: I spoke with Gunnar Fusi from GI.  States her attg is still in the OR.  We discussed the pt and agreed to DC home with augmentin/flagyl treatment and follow up this Monday or Tuesday. Patient is amendable with this plan.  Tomasita Crumble, MD 10/03/12 1534

## 2012-10-03 NOTE — ED Provider Notes (Signed)
Flow manager informed me that the patient was unable to fill his prescription, because the pharmacy did not have the resident's DEA number. I discussed this with Dr. Freida Busman, who tells me to write a new prescription and leg at the front desk for patient. A prescription was written for 10 tablets of Percocet 5-325,  Take one tab Q4 hours PRN for pain.  Roxy Horseman, PA-C 10/03/12 1826

## 2012-10-03 NOTE — ED Provider Notes (Signed)
Medical screening examination/treatment/procedure(s) were performed by non-physician practitioner and as supervising physician I was immediately available for consultation/collaboration.  Toy Baker, MD 10/03/12 [phone]

## 2012-10-04 NOTE — ED Provider Notes (Signed)
I saw and evaluated the patient, reviewed the resident's note and I agree with the findings and plan. Pt is well appearing. Normal VS and WBC. +LLQ tenderness. No peritoneal signs. Pt has been on oral abx. Case discussed with GI and will start on augmentin and pt will f/u up in their office early next week to assure resolution. Pt in agreement with plan. Return precautions given.   Loren Racer, MD 10/04/12 [phone]

## 2012-10-11 ENCOUNTER — Encounter: Payer: Self-pay | Admitting: Nurse Practitioner

## 2012-10-11 ENCOUNTER — Ambulatory Visit (INDEPENDENT_AMBULATORY_CARE_PROVIDER_SITE_OTHER): Payer: PRIVATE HEALTH INSURANCE | Admitting: Nurse Practitioner

## 2012-10-11 VITALS — BP 120/64 | HR 76 | Ht 71.0 in | Wt 164.0 lb

## 2012-10-11 DIAGNOSIS — K5732 Diverticulitis of large intestine without perforation or abscess without bleeding: Secondary | ICD-10-CM

## 2012-10-11 DIAGNOSIS — K5792 Diverticulitis of intestine, part unspecified, without perforation or abscess without bleeding: Secondary | ICD-10-CM

## 2012-10-11 MED ORDER — AMOXICILLIN-POT CLAVULANATE 875-125 MG PO TABS: 1.0000 | ORAL_TABLET | Freq: Two times a day (BID) | ORAL | Status: DC

## 2012-10-11 NOTE — Progress Notes (Signed)
HPI :   Patient is a 79 year old male known to Dr. Arlyce Dice. He has a history of adenomatous colon polyps 2001. Last colonoscopy 2005 showed only diverticulosis. Patient saw PCP 09/10/12 for a one week history of lower abdominal pain. Urinalysis and CBC were unremarkable. He was treated with Cipro and Flagyl. His abdominal pain persisted, a CT scan with contrast was done 09/18/12 and revealed diverticulosis without diverticulitis. Patient completed his antibiotics but because of worsening pain presented to the emergency department on 10/03/12. WBC was normal. CT scan of the abdomen and pelvis with contrast was repeated and surprisingly showed acute sigmoid diverticulitis. Patient was given a seven-day course of Augmentin. He is actually feeling better. Last night he had some lower abdominal pain but attributes that to gas no fever or chills. His weight is down a few pounds but he's been on a liquid diet because of recent diverticulitis. Bowel movements are okay, no blood in stool.   Past Medical History  Diagnosis Date  . Colon polyp   . Diverticulitis   . Hypertension   . Hyperlipidemia   . Arthritis   . Glaucoma   . Kidney stones   . Shingles   . Pleurisy   . Prostatitis    Family History  Problem Relation Age of Onset  . Colon cancer Father   . Colon polyps Father   . Breast cancer Mother   . Heart disease Mother   . Kidney disease Sister    History  Substance Use Topics  . Smoking status: Never Smoker   . Smokeless tobacco: Never Used  . Alcohol Use: No   Current Outpatient Prescriptions  Medication Sig Dispense Refill  . ibuprofen (ADVIL,MOTRIN) 200 MG tablet Take 400 mg by mouth every 6 (six) hours as needed for pain.      Marland Kitchen losartan (COZAAR) 100 MG tablet Take 1 tablet (100 mg total) by mouth daily.  30 tablet  5   No current facility-administered medications for this visit.   No Known Allergies  Review of Systems: Positive for hearing problems, vision changes. All other  systems reviewed and negative except where noted in HPI.    Ct Abdomen Pelvis W Contrast  10/03/2012   *RADIOLOGY REPORT*  Clinical Data: Abdominal pain.  CT ABDOMEN AND PELVIS WITH CONTRAST  Technique:  Multidetector CT imaging of the abdomen and pelvis was performed following the standard protocol during bolus administration of intravenous contrast.  Contrast: OMNIPAQUE IOHEXOL 300 MG/ML  SOLN  Comparison: 09/18/2012  Findings: No pleural effusion identified.  Dependent changes are noted in both lung bases.  Calcifications within the RCA and LAD coronary artery noted.  Stable enhancing structure within the inferior right hepatic lobe measuring 1.1 cm, image 39/series 2.  Presumed hemangioma is identified along the dome of the liver measuring approximately 1.7 cm, image 20/series 2.  The gallbladder is within normal limits. There is no biliary dilatation.  Normal appearance of the pancreas. The spleen is normal.  The adrenal glands are both within normal limits.  Stone is identified within the lower pole of the right kidney measuring 5 mm, image 46/series 2.  There is a small cyst arising from the upper pole of the right kidney which measures 1.1 cm, image 37/series 2.  The left kidney is normal.  The urinary bladder appears normal.  There is prostate gland enlargement and this has mass effect upon the bladder base.  Normal caliber of the abdominal aorta.  There is calcified atherosclerotic disease.  No aneurysm.  No upper abdominal adenopathy identified.  There is no pelvic or inguinal adenopathy identified.  The stomach is normal.  The small bowel loops have a normal course and caliber.  The appendix is visualized and appears normal. Normal appearance of the proximal colon.  Extensive diverticulosis involves the sigmoid colon.  There is new wall thickening and inflammatory changes involving the sigmoid colon compatible with acute diverticulitis.  No evidence for bowel perforation or abscess formation.   No significant free fluid or abnormal fluid collections.  Review of the visualized osseous structures is significant for mild multilevel degenerative disc disease.  IMPRESSION:  1.  Exam is positive for acute diverticulitis involving the sigmoid colon. 2.  No complicating features identified.  No evidence for bowel obstruction or abscess formation. 3.  There are two enhancing lesions within the liver parenchyma which are favored to represent hemangiomas. 4.  The nonobstructing renal calculi.   Original Report Authenticated By: Signa Kell, M.D.   Physical Exam: BP 120/64  Pulse 76  Ht 5\' 11"  (1.803 m)  Wt 164 lb (74.39 kg)  BMI 22.88 kg/m2 Constitutional: Pleasant,well-developed, white male in no acute distress. HEENT: Normocephalic and atraumatic. Conjunctivae are normal. No scleral icterus. Neck supple.  Cardiovascular: Normal rate, regular rhythm.  Pulmonary/chest: Effort normal and breath sounds normal. No wheezing, rales or rhonchi. Abdominal: Soft, nondistended, mild to moderate left lower quadrant tenderness. Bowel sounds active throughout. There are no masses palpable. No hepatomegaly. Extremities: no edema Lymphadenopathy: No cervical adenopathy noted. Neurological: Alert and oriented to person place and time. Skin: Skin is warm and dry. No rashes noted. Psychiatric: Normal mood and affect. Behavior is normal.   ASSESSMENT AND PLAN: 61. 78 year old male with documented uncomplicated sigmoid diverticulitis on CT scan. He is just finished a week of Augmentin. Though abdominal pain has resolved, patient is still tender in his left lower quadrant. Will extend Augmentin treatment for another 10 days. Patient will return for followup in a couple of weeks. He knows to call in the interim for recurrent abdominal pain and/or fever. For now he should continue a low fiber diet .to clinic  2. history of adenomatous colon polyps in 2001. Last colonoscopy 2005 negative for polyps. Patient prefers  not to pursue surveillance colonoscopy see #3.   3. family history of colon cancer in father (in his sixties). Again, patient is not interested in pursuing another colonoscopy. He has had friends who have suffered complications from a colonoscopy. He asked about options /alternatives. We did talk about virtual colonoscopies and he understands that no polypectomies or biopsies could be done during that procedure. Patient will think about his options and discuss further at followup visit. Colonoscopy is not an option right now anyway given diverticulitis.

## 2012-10-11 NOTE — Patient Instructions (Addendum)
A followup appointment with Willette Cluster, NP in 2 weeks.  Schedule is not out that far, please call next week to schedule.   Call us in the interim if you have recurrent abdominal pain and/or fevers. Low fiber diet until next followup appointment. Augmentin 875 mg twice daily for 10 days will be sent to your pharmacy. CC:  Lynnea Ferrier MD  Low-Fiber Diet Fiber is found in fruits, vegetables, and grains. A low-fiber diet restricts fibrous foods that are not digested in the small intestine. A diet containing about 10 grams of fiber is considered low fiber.  PURPOSE  To prevent blockage of a partially obstructed or narrowed gastrointestinal tract.  To reduce fecal weight and volume.  To slow the movement of feces. WHEN IS THIS DIET USED?  It may be used during the acute phase of Crohn disease, ulcerative colitis, regional enteritis, or diverticulitis.  It may be used if your intestinal or esophageal tubes are narrowing (stenosis).  It may be used as a transitional diet following surgery, injury (trauma), or illness. CHOOSING FOODS Check labels, especially on foods from the starch list. Often times, dietary fiber content is listed on the nutrition facts panel. Please ask your Registered Dietitian if you have questions about specific foods that are related to your condition, especially if the food is not listed on this handout. Breads and Starches  Allowed: White, [place], and pita breads, plain rolls, buns, or sweet rolls, doughnuts, waffles, pancakes, bagels. Plain muffins, biscuits, matzoth. Soda, saltine, graham crackers. Pretzels, rusks, melba toast, zwieback. Cooked cereals: cornmeal, farina, or cream cereals. Dry cereals: refined corn, wheat, rice, and oat cereals (check label). Potatoes prepared any way without skins, refined macaroni, spaghetti, noodles, refined rice.  Avoid: Whole-wheat bread, rolls, and crackers. Multigrains, rye, bran seeds, nuts, or coconut. Cereals containing  whole grains, multigrains, bran, coconut, nuts, raisins. Cooked or dry oatmeal. Coarse wheat cereals, granola. Cereals advertised as "high fiber." Potato skins. Whole-grain pasta, wild or brown rice. Popcorn. Vegetables  Allowed: Strained tomato and vegetable juices. Fresh lettuce, cucumber, spinach. Well-cooked or canned: asparagus, bean sprouts, broccoli, cut green beans, cauliflower, pumpkin, beets, mushrooms, olives, yellow squash, tomato, tomato sauce, zucchini, turnips.Keep servings limited to  cup.  Avoid: Fresh, cooked, or canned: artichokes, baked beans, beet greens, Brussels sprouts, corn, kale, legumes, peas, sweet potatoes. Avoid large servings of any vegetables. Fruit  Allowed: All fruit juices except prune juice. Cooked or canned fruits without skin and seeds: apricots, applesauce, cantaloupe, cherries, grapefruit, grapes, kiwi, mandarin oranges, peaches, pears, fruit cocktail, pineapple, plums, watermelon. Fresh without skin: banana, grapes, cantaloupe, avocado, cherries, pineapple, kiwi, nectarines, peaches, blueberries. Keep servings limited to  cup or 1 piece.  Avoid: Fresh: apples with or without skin, apricots, mangoes, pears, raspberries, strawberries. Prune juice and juices with pulp, stewed or dried prunes. Dried fruits, raisins, dates. Avoid large servings of all fresh fruits. Meat and Protein Substitutes  Allowed: Ground or well-cooked tender beef, ham, veal, lamb, pork, poultry. Eggs, plain cheese. Fish, oysters, shrimp, lobster, other seafood. Liver, organ meats. Smooth nut butters.  Avoid: Tough, fibrous meats with gristle. Chunky nut butter.Cheese with seeds, nuts, or other foods not allowed. Nuts, seeds, legumes, dried peas, beans, lentils. Dairy  Allowed: All milk products except those not allowed.  Avoid: Yogurt or cheese that contains nuts, seeds, or added fruit. Soups and Combination Foods  Allowed: Bouillon, broth, or cream soups made from allowed foods.  Any strained soup. Casseroles or mixed dishes made with allowed foods.  Avoid: Soups made from vegetables that are not allowed or that contain other foods not allowed. Desserts and Sweets  Allowed:Plain cakes and cookies, pie made with allowed fruit, pudding, custard, cream pie. Gelatin, fruit, ice, sherbet, frozen ice pops. Ice cream, ice milk without nuts. Plain hard candy, honey, jelly, molasses, syrup, sugar, chocolate syrup, gumdrops, marshmallows.  Avoid: Desserts, cookies, or candies that contain nuts, peanut butter, dried fruits. Jams, preserves with seeds, marmalade. Fats and Oils  Allowed:Margarine, butter, cream, mayonnaise, salad oils, plain salad dressings made from allowed foods.  Avoid: Seeds, nuts, olives. Beverages  Allowed: All, except those listed to avoid.  Avoid: Fruit juices with high pulp, prune juice. Condiments  Allowed:Ketchup, mustard, horseradish, vinegar, cream sauce, cheese sauce, cocoa powder. Spices in moderation: allspice, basil, bay leaves, celery powder or leaves, cinnamon, cumin powder, curry powder, ginger, mace, marjoram, onion or garlic powder, oregano, paprika, parsley flakes, ground pepper, rosemary, sage, savory, tarragon, thyme, turmeric.  Avoid: Coconut, pickles. SAMPLE MENU Breakfast   cup orange juice.  1 boiled egg.  1 slice white toast.  Margarine.   cup cornflakes.  1 cup milk.  Beverage. Lunch   cup chicken noodle soup.  2 to 3 oz sliced roast beef.  2 slices white bread.  Mayonnaise.   cup tomato juice.  1 small banana.  Beverage. Dinner  3 oz baked chicken.   cup scalloped potatoes.   cup cooked beets.  White dinner roll.  Margarine.   cup canned peaches.  Beverage. Document Released: 08/26/2001 Document Revised: 05/29/2011 Document Reviewed: 03/23/2011 [facility] Patient Information 2014 [place], [place].

## 2012-10-14 NOTE — Progress Notes (Signed)
Reviewed and agree with management. Joydan Gretzinger D. Evana Runnels, M.D., FACG  

## 2012-10-16 ENCOUNTER — Telehealth: Payer: Self-pay | Admitting: Gastroenterology

## 2012-10-16 ENCOUNTER — Encounter: Payer: Self-pay | Admitting: Gastroenterology

## 2012-10-16 ENCOUNTER — Ambulatory Visit (INDEPENDENT_AMBULATORY_CARE_PROVIDER_SITE_OTHER): Payer: PRIVATE HEALTH INSURANCE | Admitting: Gastroenterology

## 2012-10-16 VITALS — BP 140/68 | HR 72 | Ht 71.0 in | Wt 162.2 lb

## 2012-10-16 DIAGNOSIS — R1032 Left lower quadrant pain: Secondary | ICD-10-CM

## 2012-10-16 DIAGNOSIS — R109 Unspecified abdominal pain: Secondary | ICD-10-CM

## 2012-10-16 DIAGNOSIS — R197 Diarrhea, unspecified: Secondary | ICD-10-CM

## 2012-10-16 MED ORDER — HYOSCYAMINE SULFATE ER 0.375 MG PO TBCR: EXTENDED_RELEASE_TABLET | ORAL | Status: DC

## 2012-10-16 NOTE — Patient Instructions (Addendum)
Go to the basment today for lab study Use Flora Q For 14 days Follow up in one month

## 2012-10-16 NOTE — Assessment & Plan Note (Signed)
New onset diarrhea could be do to resolving diverticulitis. Pseudomembranous colitis should be ruled out versus antibiotic associated diarrhea not related to infection.  Recommendations #1 check stool for C. Difficile toxin #2 fluora q. one tab daily for 2 weeks

## 2012-10-16 NOTE — Telephone Encounter (Signed)
Pt states he has been being treated for diverticulitis for several weeks. States he is currently taking Augmentin but is continuing to have left sided abdominal pain and diarrhea. Pt scheduled to see Dr. Arlyce Dice today at 11am.

## 2012-10-16 NOTE — Progress Notes (Signed)
History of Present Illness:  Joel Bauer has returned for followup of acute diverticulitis.  Although somewhat improved, he continues to have discomfort in the left lower quadrant. Over the past day he developed severe diarrhea and has had 7-8 watery bowel movements. He denies bleeding or fever. He is completing a course of Augmentin. Last CT demonstrated changes consistent with acute sigmoid diverticulitis.    Review of Systems: Pertinent positive and negative review of systems were noted in the above HPI section. All other review of systems were otherwise negative.    Current Medications, Allergies, Past Medical History, Past Surgical History, Family History and Social History were reviewed in [facility] electronic medical record  Vital signs were reviewed in today's medical record. Physical Exam: General: he is a thin male in no acute distress  Abdominal exam there is very mild tenderness to palpation in the left or quadrant without guarding or rebound. Abdomen is soft

## 2012-10-16 NOTE — Assessment & Plan Note (Signed)
Left lower quadrant pain at this point is probably due to residual inflammation.Marland Kitchen Absence of fever and point tenderness or rebound mitigates against a diverticular abscess.  Medications #1 complete course of Augmentin #2 hyomax as needed

## 2012-10-17 ENCOUNTER — Other Ambulatory Visit: Payer: PRIVATE HEALTH INSURANCE

## 2012-10-17 DIAGNOSIS — R109 Unspecified abdominal pain: Secondary | ICD-10-CM

## 2012-10-18 ENCOUNTER — Other Ambulatory Visit: Payer: Self-pay | Admitting: Gastroenterology

## 2012-10-18 MED ORDER — METRONIDAZOLE 250 MG PO TABS: 250.0000 mg | ORAL_TABLET | Freq: Three times a day (TID) | ORAL | Status: DC

## 2012-10-18 NOTE — Progress Notes (Signed)
Quick Note:  He is C. dificile toxin positive. Begin flagyl 250mg  tid x 10 days ______

## 2012-11-22 ENCOUNTER — Ambulatory Visit: Payer: PRIVATE HEALTH INSURANCE | Admitting: Gastroenterology

## 2012-12-30 ENCOUNTER — Other Ambulatory Visit: Payer: Self-pay | Admitting: Family Medicine

## 2012-12-31 ENCOUNTER — Ambulatory Visit (INDEPENDENT_AMBULATORY_CARE_PROVIDER_SITE_OTHER): Payer: Medicare Other | Admitting: Family Medicine

## 2012-12-31 ENCOUNTER — Encounter: Payer: Self-pay | Admitting: Family Medicine

## 2012-12-31 VITALS — BP 140/70 | HR 80 | Temp 97.5°F | Resp 14 | Ht 71.0 in | Wt 171.0 lb

## 2012-12-31 DIAGNOSIS — H409 Unspecified glaucoma: Secondary | ICD-10-CM | POA: Insufficient documentation

## 2012-12-31 DIAGNOSIS — I10 Essential (primary) hypertension: Secondary | ICD-10-CM

## 2012-12-31 LAB — CBC WITH DIFFERENTIAL/PLATELET
Basophils Relative: 1 % (ref 0–1)
Eosinophils Absolute: 0.2 10*3/uL (ref 0.0–0.7)
HCT: 41.9 % (ref 39.0–52.0)
Hemoglobin: 14.3 g/dL (ref 13.0–17.0)
Lymphocytes Relative: 32 % (ref 12–46)
Lymphs Abs: 1.6 10*3/uL (ref 0.7–4.0)
MCH: 30.9 pg (ref 26.0–34.0)
MCV: 90.5 fL (ref 78.0–100.0)
Monocytes Absolute: 0.3 10*3/uL (ref 0.1–1.0)
Monocytes Relative: 7 % (ref 3–12)
Neutrophils Relative %: 56 % (ref 43–77)
Platelets: 150 10*3/uL (ref 150–400)
RBC: 4.63 MIL/uL (ref 4.22–5.81)
WBC: 4.9 10*3/uL (ref 4.0–10.5)

## 2012-12-31 NOTE — Progress Notes (Signed)
  Subjective:    Patient ID: Joel Bauer, male    DOB: December 13, 1933, 79 y.o.   MRN: [id number]  HPI  Patient was recently seen by his ophthalmologist and was diagnosed with a retinal bleed. His ophthalmologist referred him here to have his calcium levels checked as was his creatinine levels as the eye doctor has seen these associated with such a bleed. His most recent CMP was checked in November of 2013 was normal. The patient is currently taking losartan 100 mg by mouth daily for hypertension. He denies any chest pain, shortness of breath, dyspnea on exertion. He is not taking any aspirin. He is overdue for a CMP and a CBC. Past Medical History  Diagnosis Date  . Colon polyp   . Diverticulitis   . Hypertension   . Hyperlipidemia   . Arthritis   . Kidney stones   . Shingles   . Pleurisy   . Prostatitis   . Glaucoma     questionable   Current Outpatient Prescriptions on File Prior to Visit  Medication Sig Dispense Refill  . ibuprofen (ADVIL,MOTRIN) 200 MG tablet Take 400 mg by mouth every 6 (six) hours as needed for pain.      Marland Kitchen losartan (COZAAR) 100 MG tablet TAKE 1 TABLET (100 MG TOTAL) BY MOUTH DAILY.  30 tablet  5   No current facility-administered medications on file prior to visit.   No Known Allergies History   Social History  . Marital Status: Married    Spouse Name: N/A    Number of Children: 1  . Years of Education: N/A   Occupational History  . retired    Social History Main Topics  . Smoking status: Never Smoker   . Smokeless tobacco: Never Used  . Alcohol Use: No  . Drug Use: No  . Sexual Activity: Not on file   Other Topics Concern  . Not on file   Social History Narrative  . No narrative on file     Review of Systems  All other systems reviewed and are negative.       Objective:   Physical Exam  Vitals reviewed. Neck: No thyromegaly present.  Cardiovascular: Normal rate and regular rhythm.   No murmur heard. Pulmonary/Chest: Effort  normal and breath sounds normal. No respiratory distress. He has no wheezes. He has no rales.  Abdominal: Soft. Bowel sounds are normal.          Assessment & Plan:  1. HTN (hypertension) Blood pressure is borderline. I will check a CBC and a CMP as requested by the ophthalmologist. I am not sure the condition ophthalmologist is concerned about that the calcium level is elevated I will check an intact PTH next. - COMPLETE METABOLIC PANEL WITH GFR - CBC with Differential

## 2013-01-01 LAB — COMPLETE METABOLIC PANEL WITH GFR
AST: 20 U/L (ref 0–37)
Albumin: 3.9 g/dL (ref 3.5–5.2)
BUN: 20 mg/dL (ref 6–23)
Calcium: 8.8 mg/dL (ref 8.4–10.5)
Chloride: 105 mEq/L (ref 96–112)
Potassium: 3.7 mEq/L (ref 3.5–5.3)
Sodium: 139 mEq/L (ref 135–145)
Total Protein: 5.9 g/dL — ABNORMAL LOW (ref 6.0–8.3)

## 2013-04-11 ENCOUNTER — Ambulatory Visit (INDEPENDENT_AMBULATORY_CARE_PROVIDER_SITE_OTHER): Payer: Medicare Other | Admitting: Family Medicine

## 2013-04-11 ENCOUNTER — Encounter: Payer: Self-pay | Admitting: Family Medicine

## 2013-04-11 VITALS — BP 110/70 | HR 70 | Temp 97.8°F | Resp 18 | Ht 71.0 in | Wt 177.0 lb

## 2013-04-11 DIAGNOSIS — R109 Unspecified abdominal pain: Secondary | ICD-10-CM

## 2013-04-11 DIAGNOSIS — R197 Diarrhea, unspecified: Secondary | ICD-10-CM

## 2013-04-11 LAB — CBC W/MCH & 3 PART DIFF
HCT: 42.7 % (ref 39.0–52.0)
Hemoglobin: 14.2 g/dL (ref 13.0–17.0)
Lymphocytes Relative: 24 % (ref 12–46)
Lymphs Abs: 1.3 10*3/uL (ref 0.7–4.0)
MCH: 30.8 pg (ref 26.0–34.0)
MCHC: 33.3 g/dL (ref 30.0–36.0)
MCV: 92.6 fL (ref 78.0–100.0)
NEUTROS PCT: 65 % (ref 43–77)
Neutro Abs: 3.4 10*3/uL (ref 1.7–7.7)
Platelets: 137 10*3/uL — ABNORMAL LOW (ref 150–400)
RBC: 4.61 MIL/uL (ref 4.22–5.81)
RDW: 16 % — AB (ref 11.5–15.5)
WBC mixed population %: 11 % (ref 3–18)
WBC mixed population: 0.6 10*3/uL (ref 0.1–1.8)
WBC: 5.3 10*3/uL (ref 4.0–10.5)

## 2013-04-11 MED ORDER — METRONIDAZOLE 250 MG PO TABS: 250.0000 mg | ORAL_TABLET | Freq: Three times a day (TID) | ORAL | Status: DC

## 2013-04-11 NOTE — Patient Instructions (Signed)
Start the flagyl Bring the stool studies back on Monday We will call with lab results Call if not improved

## 2013-04-12 LAB — BASIC METABOLIC PANEL
BUN: 20 mg/dL (ref 6–23)
CHLORIDE: 103 meq/L (ref 96–112)
CO2: 28 meq/L (ref 19–32)
Calcium: 9 mg/dL (ref 8.4–10.5)
Creat: 0.94 mg/dL (ref 0.50–1.35)
GLUCOSE: 88 mg/dL (ref 70–99)
POTASSIUM: 3.9 meq/L (ref 3.5–5.3)
SODIUM: 142 meq/L (ref 135–145)

## 2013-04-13 DIAGNOSIS — R1032 Left lower quadrant pain: Secondary | ICD-10-CM | POA: Insufficient documentation

## 2013-04-13 NOTE — Progress Notes (Signed)
   Subjective:    Patient ID: Joel Bauer, male    DOB: 1933/10/23, 80 y.o.   MRN: [id number]  HPI Patient presents with recurrent abdominal pain. He has pain mostly in the right lower quadrant and towards the middle. This is the same pain that he had when he had diverticulitis about 6 months ago. He did have initial CT scan which did not show any inflammation but a second scan about 2 weeks later showed sigmoid diverticulitis. He was treated with Cipro Flagyl and then switched to Augmentin by gastroenterology he subsequently had positive C. difficile and he began having more diarrhea stools and was treated with Flagyl again. He states over the past couple weeks since his abdominal pain started he has had increased diarrhea multiple times a day similar to previous. He has not had any fever. His pain actually comes and goes and he still able to eat   Review of Systems - per above  GEN- denies fatigue, fever, weight loss,weakness, recent illness HEENT- denies eye drainage, change in vision, nasal discharge, CVS- denies chest pain, palpitations RESP- denies SOB, cough, wheeze ABD- denies N/V,+ change in stools,+ abd pain GU- denies dysuria, hematuria, dribbling, incontinence       Objective:   Physical Exam  GEN- NAD, alert and oriented x3, non toxic appearing CVS- RRR, no murmur RESP-CTAB ABD-NABS,soft,TTP RLQ and midline beneath umbilicus,  no rebound, no guarding, no mass felt, no CVA tenderness  EXT- No edema        Assessment & Plan:

## 2013-04-13 NOTE — Assessment & Plan Note (Signed)
I will go ahead and start the Flagyl he will return on Monday with stool samples and we'll check for C. difficile as well as cultures.

## 2013-04-13 NOTE — Assessment & Plan Note (Signed)
He has recurrence of the same abdominal pain he had when he had the diverticulitis flare. Based on exam today we do not need any imaging his CBC with differential was also unremarkable. I will start with medications per above.

## 2013-04-14 ENCOUNTER — Other Ambulatory Visit: Payer: Medicare Other

## 2013-04-14 DIAGNOSIS — R197 Diarrhea, unspecified: Secondary | ICD-10-CM

## 2013-04-15 ENCOUNTER — Ambulatory Visit (INDEPENDENT_AMBULATORY_CARE_PROVIDER_SITE_OTHER): Payer: Medicare Other | Admitting: Family Medicine

## 2013-04-15 ENCOUNTER — Encounter: Payer: Self-pay | Admitting: Family Medicine

## 2013-04-15 VITALS — BP 140/80 | HR 82 | Temp 97.5°F | Resp 18 | Ht 71.0 in | Wt 174.0 lb

## 2013-04-15 DIAGNOSIS — R109 Unspecified abdominal pain: Secondary | ICD-10-CM

## 2013-04-15 DIAGNOSIS — R197 Diarrhea, unspecified: Secondary | ICD-10-CM

## 2013-04-15 LAB — URINALYSIS, ROUTINE W REFLEX MICROSCOPIC
BILIRUBIN URINE: NEGATIVE
Glucose, UA: NEGATIVE mg/dL
Hgb urine dipstick: NEGATIVE
Ketones, ur: NEGATIVE mg/dL
LEUKOCYTES UA: NEGATIVE
Nitrite: NEGATIVE
Protein, ur: NEGATIVE mg/dL
SPECIFIC GRAVITY, URINE: 1.01 (ref 1.005–1.030)
UROBILINOGEN UA: 0.2 mg/dL (ref 0.0–1.0)
pH: 6.5 (ref 5.0–8.0)

## 2013-04-15 LAB — CLOSTRIDIUM DIFFICILE BY PCR: Toxigenic C. Difficile by PCR: NOT DETECTED

## 2013-04-15 MED ORDER — HYOSCYAMINE SULFATE ER 0.375 MG PO TB12: 0.3750 mg | ORAL_TABLET | Freq: Two times a day (BID) | ORAL | Status: DC

## 2013-04-15 NOTE — Progress Notes (Signed)
   Subjective:    Patient ID: Joel Bauer, male    DOB: 02-29-1934, 80 y.o.   MRN: [id number]  HPI  Patient here to followup abdominal pain. He was seen on Friday and was started on Flagyl secondary to increased watery stools and history of diverticulitis as well as C. difficile colitis. He states that he did well with abdominal pain through the weekend but then Monday began to have constant abdominal pain in the lower quadrants mostly in the suprapubic region. He denies any dysuria. He still has 2-3 weeks bowel movements a day and has cramping with them. He denies any fever nausea vomiting blood in the stool. His C. difficile was repeated although he had been on the antibiotics for about 4 days before the sample cane through this was negative   Review of Systems  GEN- denies fatigue, fever, weight loss,weakness, recent illness HEENT- denies eye drainage, change in vision, nasal discharge, CVS- denies chest pain, palpitations RESP- denies SOB, cough, wheeze ABD- denies N/V, +change in stools, +abd pain GU- denies dysuria, hematuria, dribbling, incontinence       Objective:   Physical Exam  GEN- NAD, alert and oriented x3, well appearing HEENT- MMM, oropharynx clear, non icteric ABD-NABS,soft,mild TTP midline beneath umbilicus, mild suprapubic tenderness, no rebound, no guarding, no mass felt, EXT- No edema       Assessment & Plan:

## 2013-04-15 NOTE — Assessment & Plan Note (Signed)
His at this point is waxing and waning although not severe. His abdominal exam is actually fairly benign. I did repeat a urinalysis this was completely normal he had a CBC and metabolic panel which was normal. I'm on the fence as his C. difficile was done 4 days and antibiotics as if she still has some loose stools or have him go ahead and complete the course. I'll also add on Hycosin mean which he has taken before. I've schedule followup with gastroenterology for Thursday to see if maybe he needs colonoscopy with his continued abdominal pain and bowel changes. I do not see much benefit from a CT scan today and patient agrees to

## 2013-04-15 NOTE — Patient Instructions (Addendum)
Continue antibiotics Start the lesvin F/U [name] Physician Assistant --  Thursday 04/17/13  9:45am

## 2013-04-15 NOTE — Assessment & Plan Note (Signed)
Continue meds. 

## 2013-04-17 ENCOUNTER — Ambulatory Visit (INDEPENDENT_AMBULATORY_CARE_PROVIDER_SITE_OTHER): Payer: PRIVATE HEALTH INSURANCE | Admitting: Physician Assistant

## 2013-04-17 ENCOUNTER — Ambulatory Visit: Payer: PRIVATE HEALTH INSURANCE | Admitting: Physician Assistant

## 2013-04-17 ENCOUNTER — Encounter: Payer: Self-pay | Admitting: Physician Assistant

## 2013-04-17 VITALS — BP 140/74 | HR 60 | Ht 71.0 in | Wt 176.2 lb

## 2013-04-17 DIAGNOSIS — Z8719 Personal history of other diseases of the digestive system: Secondary | ICD-10-CM

## 2013-04-17 DIAGNOSIS — Z8619 Personal history of other infectious and parasitic diseases: Secondary | ICD-10-CM

## 2013-04-17 DIAGNOSIS — K5732 Diverticulitis of large intestine without perforation or abscess without bleeding: Secondary | ICD-10-CM

## 2013-04-17 DIAGNOSIS — R1032 Left lower quadrant pain: Secondary | ICD-10-CM

## 2013-04-17 MED ORDER — CIPROFLOXACIN HCL 500 MG PO TABS: 500.0000 mg | ORAL_TABLET | Freq: Two times a day (BID) | ORAL | Status: AC

## 2013-04-17 MED ORDER — METRONIDAZOLE 250 MG PO TABS: 250.0000 mg | ORAL_TABLET | Freq: Two times a day (BID) | ORAL | Status: DC

## 2013-04-17 MED ORDER — SACCHAROMYCES BOULARDII 250 MG PO CAPS: 250.0000 mg | ORAL_CAPSULE | Freq: Two times a day (BID) | ORAL | Status: DC

## 2013-04-17 NOTE — Patient Instructions (Signed)
We sent prescriptions to [facility]. 1. Cipro 500 mg 2. Flagyl ( Metronidazole ) 250 mg sent 4 more days worth to make sure you have enough.  Call if your symptoms worsen at any point.

## 2013-04-17 NOTE — Progress Notes (Signed)
Reviewed and agree with management. Robert D. Kaplan, M.D., FACG  

## 2013-04-17 NOTE — Progress Notes (Signed)
Subjective:    Patient ID: Joel Bauer, male    DOB: 1933-08-15, 80 y.o.   MRN: [id number]  HPI  Joel Bauer is a 80 year old white male known to [name]. He has prior history of diverticulitis which was last treated in July of 2014. Initially he was treated with Cipro and Flagyl and then switched to Augmentin. After that he developed diarrhea and was C. differential positive and resolved with a course of Flagyl. Last colonoscopy was done in 2005 showed left-sided diverticulosis. Patient comes back in today stating that he's had some vague recurrence of lower Donald pain over the past 4-5 weeks which had worsened over the past one week. He describes it as a crampy discomfort that was coming and going. He is now had some more persistent soreness. Within the past week he had some alteration of his bowel habits with softer stools but no diarrhea and some urgency. He was seen by primary care on 04/11/2013 and stool for C. difficile by PCR was repeated and was negative on 04/14/2013. He also had a CBC done which was unremarkable. He was started on a course of Flagyl 250 mg 3 times daily empirically and referred back here. He had also been given some sublingual Levsin. Patient feels that the Levsin has been helping. He says he had a couple of days of more persistent lower abdominal  Discomfort- no fever ,or sweats and is currently having soft stools. He continues to have some mild discomfort in the mid lower abdomen and suprapubic area. He denies any urinary  urgency or frequency.    Review of Systems  Constitutional: Negative.   HENT: Negative.   Eyes: Negative.   Respiratory: Negative.   Cardiovascular: Negative.   Gastrointestinal: Positive for abdominal pain and diarrhea.  Endocrine: Negative.   Genitourinary: Negative.   Musculoskeletal: Negative.   Allergic/Immunologic: Negative.   Neurological: Negative.   Hematological: Negative.   Psychiatric/Behavioral: Negative.    Outpatient  Prescriptions Prior to Visit  Medication Sig Dispense Refill  . hyoscyamine (LEVBID) 0.375 MG 12 hr tablet Take 1 tablet (0.375 mg total) by mouth 2 (two) times daily.  30 tablet  1  . ibuprofen (ADVIL,MOTRIN) 200 MG tablet Take 400 mg by mouth every 6 (six) hours as needed for pain.      Marland Kitchen losartan (COZAAR) 100 MG tablet TAKE 1 TABLET (100 MG TOTAL) BY MOUTH DAILY.  30 tablet  5  . metroNIDAZOLE (FLAGYL) 250 MG tablet Take 1 tablet (250 mg total) by mouth 3 (three) times daily.  30 tablet  0   No facility-administered medications prior to visit.   No Known Allergies Patient Active Problem List   Diagnosis Date Noted  . Hx of pseudomembranous enterocolitis 04/17/2013  . Abdominal pain, unspecified site 04/13/2013  . Glaucoma   . Diarrhea 10/16/2012  . LLQ abdominal pain 09/16/2012  . Diverticulitis 09/10/2012   History  Substance Use Topics  . Smoking status: Never Smoker   . Smokeless tobacco: Never Used  . Alcohol Use: No   family history includes Breast cancer in his mother; Colon cancer in his father; Colon polyps in his father; Heart disease in his mother; Kidney disease in his sister.     Objective:   Physical Exam  well-developed older white male in no acute distress, pleasant blood pressure 140/74 pulse 60 height 5 foot 11 weight 176. HEENT; nontraumatic normocephalic EOMI PERRLA sclera anicteric, Supple ;no JVD, Cardiovascular; regular rate and rhythm with S1-S2 no murmur or  gallop, Pulmonary; clear bilaterally, Abdomen; soft is mildly tender in left lower quadrant and suprapubic area there is no guarding or rebound no palpable mass or hepatosplenomegaly bowel sounds are present, Rectal ;exam not done, Ext; no clubbing cyanosis or edema skin warm and dry, Psych ;mood and affect appropriate        Assessment & Plan:   #43  80 year old male with history of recurrent diverticulitis and one episode of C. difficile colitis following antibiotic summer 2014 now with 3-4 week  history of recurrent lower abdominal discomfort. I suspect he does have a smoldering diverticulitis. Recent C. difficile PCR negative  Plan; continue Flagyl but switched to 250 mg by mouth twice daily x10 more days Add Cipro 500 mg by mouth twice daily with food x10 days Add Florastor  twice daily x2-3 weeks Patient is asked to call back if his symptoms worsen at any point or if his symptoms fail to resolve on the above antibiotic regimen at which point he would likely need repeat CT.

## 2013-04-18 LAB — STOOL CULTURE

## 2013-04-23 ENCOUNTER — Telehealth: Payer: Self-pay | Admitting: Physician Assistant

## 2013-04-24 ENCOUNTER — Other Ambulatory Visit: Payer: Self-pay | Admitting: *Deleted

## 2013-04-24 NOTE — Telephone Encounter (Signed)
I called and asked a pharmacist if the patient got his Flagyl, did they get it in?  She said they did get a shipment and they filled it on Wed 04-23-2013. The patient picked it up.

## 2013-05-09 ENCOUNTER — Encounter: Payer: Self-pay | Admitting: *Deleted

## 2013-05-13 ENCOUNTER — Ambulatory Visit: Payer: Medicare Other | Admitting: Nurse Practitioner

## 2013-05-14 ENCOUNTER — Ambulatory Visit (INDEPENDENT_AMBULATORY_CARE_PROVIDER_SITE_OTHER): Payer: PRIVATE HEALTH INSURANCE | Admitting: Nurse Practitioner

## 2013-05-14 ENCOUNTER — Other Ambulatory Visit: Payer: Medicare Other

## 2013-05-14 ENCOUNTER — Encounter: Payer: Self-pay | Admitting: Nurse Practitioner

## 2013-05-14 VITALS — BP 132/62 | HR 80 | Ht 71.0 in | Wt 174.4 lb

## 2013-05-14 DIAGNOSIS — R197 Diarrhea, unspecified: Secondary | ICD-10-CM

## 2013-05-14 DIAGNOSIS — R109 Unspecified abdominal pain: Secondary | ICD-10-CM

## 2013-05-14 DIAGNOSIS — K5732 Diverticulitis of large intestine without perforation or abscess without bleeding: Secondary | ICD-10-CM

## 2013-05-14 DIAGNOSIS — K5792 Diverticulitis of intestine, part unspecified, without perforation or abscess without bleeding: Secondary | ICD-10-CM

## 2013-05-14 MED ORDER — HYOSCYAMINE SULFATE 0.125 MG SL SUBL: 0.1250 mg | SUBLINGUAL_TABLET | SUBLINGUAL | Status: DC | PRN

## 2013-05-14 NOTE — Progress Notes (Signed)
     History of Present Illness:  Patient is a 80 year old male known to [name]. He was treated for divertculitis in July of 2014 . Following that patient developed c-diff .   In January of this year patient developed recurrent lower abdominal pain which was associated with minor bowel changes.  C-diff was negative, PCP treated him empirically for diverticulitis with flagyl. We saw the patient late January for ongong symptoms,  felt he could have smoldering diverticulitis and added Cipro to regimen. Patient comes back in today with a vague recurrence of lower abdominal pain, worse over the last week. He completed antibiotics 7 to 8 days ago.  No urinary symptoms.  No fevers. Appeitte okay, no fevers.   Current Medications, Allergies, Past Medical History, Past Surgical History, Family History and Social History were reviewed in [employer] record.  Physical Exam: General: Pleasant, well developed , white male in no acute distress Head: Normocephalic and atraumatic Eyes:  sclerae anicteric, conjunctiva pink  Ears: Normal auditory acuity Lungs: Clear throughout to auscultation Heart: Regular rate and rhythm Abdomen: Soft, non distended, mild to moderate mid lower tenderness. No masses, no hepatomegaly. Normal bowel sounds Musculoskeletal: Symmetrical with no gross deformities  Extremities: No edema  Neurological: Alert oriented x 4, grossly nonfocal Psychological:  Alert and cooperative. Normal mood and affect  Assessment and Recommendations:  23. 80 year old male with documented history of sigmoid diverticulitis in July 2014. In January he was treated empirically with Flagyl for recurrent symptoms.  Symptoms partially resolved, we extended course of Flagyl and added in Cipro which he completed 7-8 days ago. Now with recurrent mid lower abdominal pain reminiscent of diverticulitis. He is having loose stools as well. Will check stool for C-diff, if negative will treat  with a course of Augmentin. Trial of Levsin SL for discomfort (he doesn't definitively have glaucoma). Patient may need surgical evaluation depending on clinical course. Will call with C-diff results and further recommendations. Return to clinic for reassessment in 3-4 weeks. In the interim patient will call us, or go to ED for worsening pain and /or fevers.   2. History of adenomatous colon polyps 2001. He is overdue for surveillance though did get recall letter in 2012. We cannot proceed with colonoscopy until resolution of #1.

## 2013-05-14 NOTE — Patient Instructions (Signed)
Please go to the basement level to our lab for a CDIFF stool study. We sent a prescription to [facility].    Call our number, [phone] and ask for the MD on call.  Ask about stool results.  May need treated for either Cdiff or DIverticulitis.  Depending on results of CDIFF by PCR test.

## 2013-05-15 ENCOUNTER — Encounter: Payer: Self-pay | Admitting: Nurse Practitioner

## 2013-05-16 ENCOUNTER — Telehealth: Payer: Self-pay

## 2013-05-16 ENCOUNTER — Telehealth: Payer: Self-pay | Admitting: Nurse Practitioner

## 2013-05-16 LAB — CLOSTRIDIUM DIFFICILE BY PCR: Toxigenic C. Difficile by PCR: DETECTED — CR

## 2013-05-16 MED ORDER — METRONIDAZOLE 500 MG PO TABS: 500.0000 mg | ORAL_TABLET | Freq: Three times a day (TID) | ORAL | Status: DC

## 2013-05-16 NOTE — Telephone Encounter (Signed)
Left message for patient to call me back regarding his recent stool test results.  We got a verbal call from [facility] lab with his C-diff results.  He had positive C-Diff.  Tye Savoy NP informed and told me to rx Flagyl 500mg  TID x 14 days, pt needs to purchase Florastor and take as directed.  He needs follow up appointment in a month.  We will tell him when he calls about C-diff precautions :  Hand washing, use his own bathroom, use clorox wipes.

## 2013-05-16 NOTE — Telephone Encounter (Signed)
Line busy - will try again later

## 2013-05-16 NOTE — Telephone Encounter (Signed)
Spoke with patient and informed him of his  C-diff results /plans as directed per Tye Savoy NP.  Mailed patient C-Diff handout, confirmed address.  Schedule not out yet for next month for Tye Savoy NP, patient aware and will call back and set up appointment.  Flagyl rx'ed and patient said he is taking a [facility] probiotic.

## 2013-05-16 NOTE — Telephone Encounter (Signed)
Notified patient that results are not back yet.

## 2013-05-19 NOTE — Progress Notes (Signed)
If no clinical improvement within for 5 days of antibiotic therapy would repeat CT scan

## 2013-05-29 ENCOUNTER — Encounter: Payer: Self-pay | Admitting: *Deleted

## 2013-06-04 ENCOUNTER — Telehealth: Payer: Self-pay | Admitting: Nurse Practitioner

## 2013-06-04 NOTE — Telephone Encounter (Signed)
Patient completed 14 days of Flagyl for C.diff on Friday. His symptoms of pain and diarrhea have returned. States he has been to bathroom with diarrhea 9 times since 4 AM. Please, advise.

## 2013-06-05 MED ORDER — METRONIDAZOLE 500 MG PO TABS: 500.0000 mg | ORAL_TABLET | Freq: Three times a day (TID) | ORAL | Status: DC

## 2013-06-05 MED ORDER — SACCHAROMYCES BOULARDII 250 MG PO CAPS: 250.0000 mg | ORAL_CAPSULE | Freq: Two times a day (BID) | ORAL | Status: DC

## 2013-06-05 NOTE — Telephone Encounter (Signed)
Per Tye Savoy, NP ,  Restart Flagyl x 7 days, Florastor 1 po BID x 7 days, OV with Tye Savoy, NP after completes Flagyl. Rx sent. Left a message for patient to call me.

## 2013-06-06 ENCOUNTER — Ambulatory Visit (INDEPENDENT_AMBULATORY_CARE_PROVIDER_SITE_OTHER): Payer: Medicare Other | Admitting: Nurse Practitioner

## 2013-06-06 ENCOUNTER — Telehealth: Payer: Self-pay | Admitting: *Deleted

## 2013-06-06 VITALS — Wt 173.4 lb

## 2013-06-06 DIAGNOSIS — A0472 Enterocolitis due to Clostridium difficile, not specified as recurrent: Secondary | ICD-10-CM

## 2013-06-06 NOTE — Progress Notes (Signed)
     Did not see patient today. This appointment was made as a followup to his previous appointment with me. We tried to reach him yesterday to give him a future appointment. Recently treated patient for C. difficile, he has been having recurrent diarrhea and will restart Flagyl today. I will to see him after completion of Flagyl. Patient looked fine. We returned his copayment. I will see him in 10-14 days in the future

## 2013-06-06 NOTE — Telephone Encounter (Signed)
Spoke with patient and his pharmacy will not have the Flagyl until 4:00 PM. He will call me if they do not have it at that time. He understands to take the [facility] and he will schedule with Tye Savoy, NP when he completes the Flagyl.

## 2013-06-06 NOTE — Telephone Encounter (Signed)
Per Tye Savoy, NP call later today and be sure patient got Flagyl, Starts Florastor BID and gets OV scheduled after completing antibiotic.

## 2013-06-10 ENCOUNTER — Telehealth: Payer: Self-pay | Admitting: Nurse Practitioner

## 2013-06-10 NOTE — Telephone Encounter (Signed)
Spoke with patient and he will finish his Flagyl on Thursday. He was told to have f/u OV several days later with Tye Savoy, NP. He has been told she will be at the hospital next week.  Scheduled patient with Nicoletta Ba, PA on 06/16/13 at 11:00 AM. Tye Savoy, NP aware.

## 2013-06-16 ENCOUNTER — Ambulatory Visit (INDEPENDENT_AMBULATORY_CARE_PROVIDER_SITE_OTHER): Payer: PRIVATE HEALTH INSURANCE | Admitting: Physician Assistant

## 2013-06-16 ENCOUNTER — Encounter: Payer: Self-pay | Admitting: Physician Assistant

## 2013-06-16 ENCOUNTER — Other Ambulatory Visit: Payer: Medicare Other

## 2013-06-16 VITALS — BP 140/70 | HR 72 | Ht 71.0 in | Wt 173.0 lb

## 2013-06-16 DIAGNOSIS — Z8719 Personal history of other diseases of the digestive system: Secondary | ICD-10-CM

## 2013-06-16 DIAGNOSIS — R197 Diarrhea, unspecified: Secondary | ICD-10-CM

## 2013-06-16 DIAGNOSIS — A0472 Enterocolitis due to Clostridium difficile, not specified as recurrent: Secondary | ICD-10-CM

## 2013-06-16 DIAGNOSIS — Z8619 Personal history of other infectious and parasitic diseases: Secondary | ICD-10-CM

## 2013-06-16 NOTE — Patient Instructions (Signed)
Please go to the basement level to our lab for the stool test.  Take the Florastor twice daily.

## 2013-06-16 NOTE — Progress Notes (Signed)
Subjective:    Patient ID: Joel Bauer, male    DOB: Dec 31, 1933, 80 y.o.   MRN: [id number]  HPI  Joel Bauer is a pleasant 80 year old white male known to [name] With History of Diverticular Disease and Colon Polyps. He has had 2 episodes of diverticulitis in the past year. He was to you with a course of antibiotics summer of 2014 and then developed C. difficile colitis thereafter. This was treated with metronidazole. Patient did well until January 2015 when he developed recurrent left lower quadrant pain consistent with diverticulitis. He was initially started on metronidazole by primary care and then sent here. Due to persistence of pain Cipro was added. He results of diverticulitis and then in late February developed abrupt onset of recurrent diarrhea and was C. difficile positive on 05/16/2013. He was seen by polyp done third and started on a 14 day course of metronidazole 503 times daily and Florastor. When he completed that course he felt better but did not feel that his symptoms had completely resolved and was called in another 7 days of Flagyl.  Patient states that  At his worst she was having 20 bowel movements per day and now over the past week he is having 3-4 bowel movements per day. He finished the metronidazole on 06/12/2013. He says he still has some mild abdominal cramping and urgency and stools are semi-formed. He has not had any fever chills nausea or vomiting.    Review of Systems  Constitutional: Negative.   HENT: Negative.   Eyes: Negative.   Respiratory: Negative.   Cardiovascular: Negative.   Gastrointestinal: Positive for abdominal pain and diarrhea.  Endocrine: Negative.   Genitourinary: Negative.   Musculoskeletal: Negative.   Allergic/Immunologic: Negative.   Neurological: Negative.   Hematological: Negative.   Psychiatric/Behavioral: Negative.    Outpatient Prescriptions Prior to Visit  Medication Sig Dispense Refill  . bismuth subsalicylate (PEPTO  BISMOL) 262 MG chewable tablet Chew 524 mg by mouth as needed.      . hyoscyamine (LEVSIN/SL) 0.125 MG SL tablet Place 1 tablet (0.125 mg total) under the tongue every 4 (four) hours as needed.  30 tablet  0  . ibuprofen (ADVIL,MOTRIN) 200 MG tablet Take 400 mg by mouth every 6 (six) hours as needed for pain.      Marland Kitchen losartan (COZAAR) 100 MG tablet TAKE 1 TABLET (100 MG TOTAL) BY MOUTH DAILY.  30 tablet  5  . Probiotic Product (ALIGN PO) Take 1 capsule by mouth daily.      Marland Kitchen saccharomyces boulardii (FLORASTOR) 250 MG capsule Take 1 capsule (250 mg total) by mouth 2 (two) times daily.  14 capsule  0   No facility-administered medications prior to visit.   No Known Allergies Patient Active Problem List   Diagnosis Date Noted  . Hx of pseudomembranous enterocolitis 04/17/2013  . Abdominal pain, unspecified site 04/13/2013  . Glaucoma   . LLQ abdominal pain 09/16/2012  . Diverticulitis 09/10/2012   History  Substance Use Topics  . Smoking status: Never Smoker   . Smokeless tobacco: Never Used  . Alcohol Use: No   family history includes Breast cancer in his mother; Colon cancer in his father; Colon polyps in his father; Heart disease in his mother; Kidney disease in his sister.     Objective:   Physical Exam  well-developed elderly white male in no acute distress, pleasant blood pressure 140/70 pulse 72 height 5 foot 11 weight 173. HEENT; nontraumatic normocephalic EOMI PERRLA sclera anicteric,  Supple; no JVD, Cardiovascular; regular rate and rhythm with S1-S2 no murmur or gallop, Pulmonary; clear bilaterally, Abdomen; soft basically nontender there is no palpable mass or hepatosplenomegaly bowel sounds are present, Rectal; exam not done, Extremities ;no clubbing cyanosis or edema skin warm and dry, Psych; mood and affect appropriate        Assessment & Plan:  #13  80 year old male with history of recurrent C. difficile colitis. He is improved but perhaps not completely resolved after 21  days of metronidazole. He is currently having 3-4 bowel movements per day and some mild abdominal cramping Rule out persistent C. differential refractory to metronidazole #2 diverticular disease with history of recurrent diverticulitis #3 history of colon polyps last colonoscopy 2012 #4 family history of colon cancer in patient's father  Plan; repeat stool for C. difficile by PCR today Continue Florastor twice daily If C. difficile is positive he will require vancomycin and would lean towards a long tapered course to assure clearance.

## 2013-06-17 ENCOUNTER — Telehealth: Payer: Self-pay | Admitting: Physician Assistant

## 2013-06-17 LAB — CLOSTRIDIUM DIFFICILE BY PCR: Toxigenic C. Difficile by PCR: NOT DETECTED

## 2013-06-17 NOTE — Progress Notes (Signed)
Reviewed and agree with management. Mckinsley Koelzer D. Kaniah Rizzolo, M.D., FACG  

## 2013-06-17 NOTE — Telephone Encounter (Signed)
Left a message for patient to call me. 

## 2013-06-17 NOTE — Telephone Encounter (Signed)
Spoke with patient and told him his C. Diff results were negative. He states he is not having diarrhea but "trot's to bathroom a lot."

## 2013-06-17 NOTE — Telephone Encounter (Signed)
I would  just monitor for now- continue probiotic, it can take along time for bowel to return to normal after cdiff. He should call back if any significant change in stools

## 2013-06-18 ENCOUNTER — Telehealth: Payer: Self-pay | Admitting: *Deleted

## 2013-06-18 ENCOUNTER — Other Ambulatory Visit: Payer: Self-pay | Admitting: *Deleted

## 2013-06-18 MED ORDER — AMBULATORY NON FORMULARY MEDICATION: 250.0000 mg | Freq: Four times a day (QID) | Status: DC

## 2013-06-18 NOTE — Telephone Encounter (Signed)
Spoke with patient and he states since 2 AM, he has been to the bathroom 4 times. One stool was watery. Also reports abdominal pain has started back. Please, advise.

## 2013-06-18 NOTE — Telephone Encounter (Signed)
Vancomycin rx called to [facility]. They will call patient when it is ready. Spoke with patient and gave him Nicoletta Ba, [place] recommendations. He will call back when completes Vancomycin.

## 2013-06-18 NOTE — Telephone Encounter (Signed)
He is puzzling- I know he is frustrated because he has had cdiff more than once- next step would be a course of Vancomycin for suspected cdiff... Can go ahead an call in 250 mg liquid or capsule  4  X daily x 14 days ,continue florastor- ask him to call when he completes  the vancomycin with a progress report

## 2013-06-18 NOTE — Telephone Encounter (Signed)
Called [employer], did a Prior authorization for Vancocin HCL 250 mg # 56, Dose- take 1 cap 4 times daily x 14 days. Approved with # [id number]. Copay for patient is $10.00. [name] will call the patient to advise his prescription is ready for pick up and will let him know the copay amount.

## 2013-07-03 ENCOUNTER — Other Ambulatory Visit: Payer: Self-pay | Admitting: Family Medicine

## 2013-10-17 ENCOUNTER — Telehealth: Payer: Self-pay | Admitting: Gastroenterology

## 2013-10-17 ENCOUNTER — Encounter (HOSPITAL_COMMUNITY)
Admission: RE | Admit: 2013-10-17 | Discharge: 2013-10-17 | Disposition: A | Discharge status: Home / Self Care | Payer: Medicare Other | Source: Ambulatory Visit | Attending: Urology | Admitting: Urology

## 2013-10-17 ENCOUNTER — Encounter (HOSPITAL_COMMUNITY): Payer: Self-pay

## 2013-10-17 ENCOUNTER — Encounter (HOSPITAL_COMMUNITY): Payer: Self-pay | Admitting: Anesthesiology

## 2013-10-17 ENCOUNTER — Other Ambulatory Visit: Payer: Self-pay | Admitting: Urology

## 2013-10-17 ENCOUNTER — Ambulatory Visit (HOSPITAL_COMMUNITY)
Admission: RE | Admit: 2013-10-17 | Discharge: 2013-10-17 | Disposition: A | Discharge status: Home / Self Care | Payer: Medicare Other | Source: Ambulatory Visit | Attending: Anesthesiology | Admitting: Anesthesiology

## 2013-10-17 ENCOUNTER — Encounter (HOSPITAL_COMMUNITY): Payer: Self-pay | Admitting: Pharmacy Technician

## 2013-10-17 DIAGNOSIS — M129 Arthropathy, unspecified: Secondary | ICD-10-CM | POA: Diagnosis not present

## 2013-10-17 DIAGNOSIS — N400 Enlarged prostate without lower urinary tract symptoms: Secondary | ICD-10-CM | POA: Diagnosis not present

## 2013-10-17 DIAGNOSIS — R011 Cardiac murmur, unspecified: Secondary | ICD-10-CM | POA: Diagnosis not present

## 2013-10-17 DIAGNOSIS — Z85828 Personal history of other malignant neoplasm of skin: Secondary | ICD-10-CM | POA: Diagnosis not present

## 2013-10-17 DIAGNOSIS — K219 Gastro-esophageal reflux disease without esophagitis: Secondary | ICD-10-CM | POA: Diagnosis not present

## 2013-10-17 DIAGNOSIS — Z8719 Personal history of other diseases of the digestive system: Secondary | ICD-10-CM | POA: Diagnosis not present

## 2013-10-17 DIAGNOSIS — I10 Essential (primary) hypertension: Secondary | ICD-10-CM | POA: Diagnosis not present

## 2013-10-17 DIAGNOSIS — Z86010 Personal history of colonic polyps: Secondary | ICD-10-CM | POA: Diagnosis not present

## 2013-10-17 DIAGNOSIS — E785 Hyperlipidemia, unspecified: Secondary | ICD-10-CM | POA: Diagnosis not present

## 2013-10-17 DIAGNOSIS — N201 Calculus of ureter: Secondary | ICD-10-CM | POA: Diagnosis present

## 2013-10-17 DIAGNOSIS — N1330 Unspecified hydronephrosis: Secondary | ICD-10-CM | POA: Diagnosis not present

## 2013-10-17 HISTORY — DX: Personal history of other infectious and parasitic diseases: Z86.19

## 2013-10-17 HISTORY — DX: Personal history of other malignant neoplasm of skin: Z85.828

## 2013-10-17 HISTORY — DX: Personal history of urinary calculi: Z87.442

## 2013-10-17 HISTORY — DX: Gastro-esophageal reflux disease without esophagitis: K21.9

## 2013-10-17 HISTORY — DX: Cardiac murmur, unspecified: R01.1

## 2013-10-17 LAB — CBC
HCT: 43 % (ref 39.0–52.0)
Hemoglobin: 14.8 g/dL (ref 13.0–17.0)
MCH: 30.6 pg (ref 26.0–34.0)
MCHC: 34.4 g/dL (ref 30.0–36.0)
MCV: 88.8 fL (ref 78.0–100.0)
PLATELETS: 138 10*3/uL — AB (ref 150–400)
RBC: 4.84 MIL/uL (ref 4.22–5.81)
RDW: 13.7 % (ref 11.5–15.5)
WBC: 7.2 10*3/uL (ref 4.0–10.5)

## 2013-10-17 LAB — BASIC METABOLIC PANEL
ANION GAP: 11 (ref 5–15)
BUN: 20 mg/dL (ref 6–23)
CALCIUM: 9.3 mg/dL (ref 8.4–10.5)
CO2: 26 meq/L (ref 19–32)
Chloride: 104 mEq/L (ref 96–112)
Creatinine, Ser: 1 mg/dL (ref 0.50–1.35)
GFR calc non Af Amer: 70 mL/min — ABNORMAL LOW (ref >90)
GFR, EST AFRICAN AMERICAN: 82 mL/min — AB (ref >90)
Glucose, Bld: 91 mg/dL (ref 70–99)
Potassium: 4.2 mEq/L (ref 3.7–5.3)
SODIUM: 141 meq/L (ref 137–147)

## 2013-10-17 NOTE — Patient Instructions (Addendum)
Joel Bauer  10/17/2013                           YOUR PROCEDURE IS SCHEDULED ON: 10/18/13 at 7:30 am               [place] WAIT IN MAIN LOBBY - NURSE WILL              GET YOU AT 5:30 AM                             CALL THIS NUMBER IF ANY PROBLEMS THE DAY OF SURGERY :               [phone]                                REMEMBER:   Do not eat food or drink liquids AFTER MIDNIGHT                  Take these medicines the morning of surgery with               A SIPS OF WATER :   OXYCODONE IF NEEDED FOR PAIN      Do not wear jewelry, make-up   Do not wear lotions, powders, or perfumes.   Do not shave legs or underarms 12 hrs. before surgery (men may shave face)  Do not bring valuables to the hospital.  Contacts, dentures or bridgework may not be worn into surgery.  Leave suitcase in the car. After surgery it may be brought to your room.  For patients admitted to the hospital more than one night, checkout time is            11:00 AM                                                       The day of discharge.   Patients discharged the day of surgery will not be allowed to drive home.            If going home same day of surgery, must have someone stay with you              FIRST 24 hrs at home and arrange for some one to drive you              home from hospital.   ________________________________________________________________________                                                                        [place]  Before surgery, you can play an important role.  Because skin is not sterile, your skin needs to be as free of germs as possible.  You can reduce the number of germs on your skin by washing with CHG (chlorahexidine gluconate) soap before surgery.  CHG is an  antiseptic cleaner which kills germs and bonds with the skin to continue killing germs even after washing. Please DO NOT use  if you have an allergy to CHG or antibacterial soaps.  If your skin becomes reddened/irritated stop using the CHG and inform your nurse when you arrive at Short Stay. Do not shave (including legs and underarms) for at least 48 hours prior to the first CHG shower.  You may shave your face. Please follow these instructions carefully:   1.  Shower with CHG Soap the night before surgery and the  morning of Surgery.   2.  If you choose to wash your hair, wash your hair first as usual with your  normal  Shampoo.   3.  After you shampoo, rinse your hair and body thoroughly to remove the  shampoo.                                         4.  Use CHG as you would any other liquid soap.  You can apply chg directly  to the skin and wash . Gently wash with scrungie or clean wascloth    5.  Apply the CHG Soap to your body ONLY FROM THE NECK DOWN.   Do not use on open                           Wound or open sores. Avoid contact with eyes, ears mouth and genitals (private parts).                        Genitals (private parts) with your normal soap.              6.  Wash thoroughly, paying special attention to the area where your surgery  will be performed.   7.  Thoroughly rinse your body with warm water from the neck down.   8.  DO NOT shower/wash with your normal soap after using and rinsing off  the CHG Soap .                9.  Pat yourself dry with a clean towel.             10.  Wear clean pajamas.             11.  Place clean sheets on your bed the night of your first shower and do not  sleep with pets.  Day of Surgery : Do not apply any lotions/deodorants the morning of surgery.  Please wear clean clothes to the hospital/surgery center.  FAILURE TO FOLLOW THESE INSTRUCTIONS MAY RESULT IN THE CANCELLATION OF YOUR SURGERY    PATIENT SIGNATURE_________________________________  ______________________________________________________________________

## 2013-10-17 NOTE — Telephone Encounter (Signed)
Pt states he is having diarrhea and problems like he was having when he had cdiff. Pt would like to be seen sooner than 1st available. Pt scheduled to see Tye Savoy NP 10/20/13@10 :30am. Pt aware of appt.

## 2013-10-17 NOTE — Anesthesia Preprocedure Evaluation (Signed)
Anesthesia Evaluation  Patient identified by MRN, date of birth, ID band Patient awake    Reviewed: Allergy & Precautions, H&P , NPO status , Patient's Chart, lab work & pertinent test results  Airway Mallampati: II TM Distance: >3 FB Neck ROM: Full    Dental no notable dental hx.    Pulmonary neg pulmonary ROS,  breath sounds clear to auscultation  Pulmonary exam normal       Cardiovascular hypertension, Pt. on medications + Valvular Problems/Murmurs Rhythm:Regular Rate:Normal     Neuro/Psych negative neurological ROS  negative psych ROS   GI/Hepatic Neg liver ROS, GERD-  ,  Endo/Other  negative endocrine ROS  Renal/GU negative Renal ROS  negative genitourinary   Musculoskeletal negative musculoskeletal ROS (+)   Abdominal   Peds negative pediatric ROS (+)  Hematology negative hematology ROS (+)   Anesthesia Other Findings   Reproductive/Obstetrics negative OB ROS                           Anesthesia Physical Anesthesia Plan  ASA: II  Anesthesia Plan: General   Post-op Pain Management:    Induction: Intravenous  Airway Management Planned: LMA  Additional Equipment:   Intra-op Plan:   Post-operative Plan: Extubation in OR  Informed Consent: I have reviewed the patients History and Physical, chart, labs and discussed the procedure including the risks, benefits and alternatives for the proposed anesthesia with the patient or authorized representative who has indicated his/her understanding and acceptance.   Dental advisory given  Plan Discussed with: CRNA  Anesthesia Plan Comments:         Anesthesia Quick Evaluation

## 2013-10-18 ENCOUNTER — Encounter (HOSPITAL_COMMUNITY)
Admission: RE | Disposition: A | Discharge status: Home / Self Care | Payer: Self-pay | Source: Ambulatory Visit | Attending: Urology

## 2013-10-18 ENCOUNTER — Encounter (HOSPITAL_COMMUNITY): Payer: Self-pay | Admitting: *Deleted

## 2013-10-18 ENCOUNTER — Ambulatory Visit (HOSPITAL_COMMUNITY): Payer: Medicare Other | Admitting: Anesthesiology

## 2013-10-18 ENCOUNTER — Ambulatory Visit (HOSPITAL_COMMUNITY)
Admission: RE | Admit: 2013-10-18 | Discharge: 2013-10-18 | Disposition: A | Discharge status: Home / Self Care | Payer: Medicare Other | Source: Ambulatory Visit | Attending: Urology | Admitting: Urology

## 2013-10-18 ENCOUNTER — Encounter (HOSPITAL_COMMUNITY): Payer: Medicare Other | Admitting: Anesthesiology

## 2013-10-18 DIAGNOSIS — M129 Arthropathy, unspecified: Secondary | ICD-10-CM | POA: Insufficient documentation

## 2013-10-18 DIAGNOSIS — I10 Essential (primary) hypertension: Secondary | ICD-10-CM | POA: Diagnosis not present

## 2013-10-18 DIAGNOSIS — Z860100 Personal history of colon polyps, unspecified: Secondary | ICD-10-CM | POA: Insufficient documentation

## 2013-10-18 DIAGNOSIS — N201 Calculus of ureter: Secondary | ICD-10-CM | POA: Diagnosis not present

## 2013-10-18 DIAGNOSIS — E785 Hyperlipidemia, unspecified: Secondary | ICD-10-CM | POA: Insufficient documentation

## 2013-10-18 DIAGNOSIS — Z85828 Personal history of other malignant neoplasm of skin: Secondary | ICD-10-CM | POA: Insufficient documentation

## 2013-10-18 DIAGNOSIS — N400 Enlarged prostate without lower urinary tract symptoms: Secondary | ICD-10-CM | POA: Diagnosis not present

## 2013-10-18 DIAGNOSIS — N1330 Unspecified hydronephrosis: Secondary | ICD-10-CM | POA: Insufficient documentation

## 2013-10-18 DIAGNOSIS — K219 Gastro-esophageal reflux disease without esophagitis: Secondary | ICD-10-CM | POA: Insufficient documentation

## 2013-10-18 DIAGNOSIS — R011 Cardiac murmur, unspecified: Secondary | ICD-10-CM | POA: Insufficient documentation

## 2013-10-18 DIAGNOSIS — Z8719 Personal history of other diseases of the digestive system: Secondary | ICD-10-CM | POA: Insufficient documentation

## 2013-10-18 HISTORY — PX: CYSTOSCOPY WITH RETROGRADE PYELOGRAM, URETEROSCOPY AND STENT PLACEMENT: SHX5789

## 2013-10-18 SURGERY — CYSTOURETEROSCOPY, WITH RETROGRADE PYELOGRAM AND STENT INSERTION
Anesthesia: General | Site: Ureter | Laterality: Right

## 2013-10-18 MED ORDER — EPHEDRINE SULFATE 50 MG/ML IJ SOLN
INTRAMUSCULAR | Status: DC | PRN
  Administered 2013-10-18: 5 mg via INTRAVENOUS

## 2013-10-18 MED ORDER — KETOROLAC TROMETHAMINE 30 MG/ML IJ SOLN
INTRAMUSCULAR | Status: AC
  Filled 2013-10-18: qty 1

## 2013-10-18 MED ORDER — PROPOFOL 10 MG/ML IV BOLUS
INTRAVENOUS | Status: AC
  Filled 2013-10-18: qty 20

## 2013-10-18 MED ORDER — ONDANSETRON HCL 4 MG/2ML IJ SOLN
INTRAMUSCULAR | Status: DC | PRN
  Administered 2013-10-18: 4 mg via INTRAVENOUS

## 2013-10-18 MED ORDER — TAMSULOSIN HCL 0.4 MG PO CAPS: 0.4000 mg | ORAL_CAPSULE | Freq: Every day | ORAL | Status: DC

## 2013-10-18 MED ORDER — FENTANYL CITRATE 0.05 MG/ML IJ SOLN
INTRAMUSCULAR | Status: AC
  Filled 2013-10-18: qty 2

## 2013-10-18 MED ORDER — METOCLOPRAMIDE HCL 5 MG/ML IJ SOLN
INTRAMUSCULAR | Status: DC | PRN
  Administered 2013-10-18: 10 mg via INTRAVENOUS

## 2013-10-18 MED ORDER — CEPHALEXIN 500 MG PO CAPS: 500.0000 mg | ORAL_CAPSULE | Freq: Two times a day (BID) | ORAL | Status: DC

## 2013-10-18 MED ORDER — OXYCODONE-ACETAMINOPHEN 5-325 MG PO TABS
1.0000 | ORAL_TABLET | Freq: Once | ORAL | Status: AC | PRN
  Administered 2013-10-18: 2 via ORAL

## 2013-10-18 MED ORDER — FENTANYL CITRATE 0.05 MG/ML IJ SOLN: 25.0000 ug | INTRAMUSCULAR | Status: DC | PRN

## 2013-10-18 MED ORDER — LACTATED RINGERS IV SOLN: INTRAVENOUS | Status: DC

## 2013-10-18 MED ORDER — PROPOFOL 10 MG/ML IV BOLUS
INTRAVENOUS | Status: DC | PRN
  Administered 2013-10-18: 150 mg via INTRAVENOUS

## 2013-10-18 MED ORDER — LACTATED RINGERS IV SOLN
INTRAVENOUS | Status: DC | PRN
  Administered 2013-10-18: 07:00:00 via INTRAVENOUS

## 2013-10-18 MED ORDER — METOCLOPRAMIDE HCL 5 MG/ML IJ SOLN
INTRAMUSCULAR | Status: AC
  Filled 2013-10-18: qty 2

## 2013-10-18 MED ORDER — CEFAZOLIN SODIUM-DEXTROSE 2-3 GM-% IV SOLR
2.0000 g | INTRAVENOUS | Status: AC
  Administered 2013-10-18: 2 g via INTRAVENOUS

## 2013-10-18 MED ORDER — IOHEXOL 300 MG/ML  SOLN
INTRAMUSCULAR | Status: DC | PRN
  Administered 2013-10-18: 10 mL via INTRAVENOUS

## 2013-10-18 MED ORDER — OXYCODONE-ACETAMINOPHEN 5-325 MG PO TABS
ORAL_TABLET | ORAL | Status: AC
  Filled 2013-10-18: qty 2

## 2013-10-18 MED ORDER — CEFAZOLIN SODIUM-DEXTROSE 2-3 GM-% IV SOLR
INTRAVENOUS | Status: AC
  Filled 2013-10-18: qty 50

## 2013-10-18 MED ORDER — FENTANYL CITRATE 0.05 MG/ML IJ SOLN
INTRAMUSCULAR | Status: DC | PRN
Start: 2013-10-18 — End: 2013-10-18
  Administered 2013-10-18 (×3): 25 ug via INTRAVENOUS

## 2013-10-18 MED ORDER — SODIUM CHLORIDE 0.9 % IR SOLN
Status: DC | PRN
  Administered 2013-10-18: 1000 mL
  Administered 2013-10-18: 3000 mL

## 2013-10-18 MED ORDER — KETOROLAC TROMETHAMINE 30 MG/ML IJ SOLN
30.0000 mg | Freq: Once | INTRAMUSCULAR | Status: AC | PRN
  Administered 2013-10-18: 30 mg via INTRAVENOUS

## 2013-10-18 SURGICAL SUPPLY — 24 items
BAG URINE DRAINAGE (UROLOGICAL SUPPLIES) ×3 IMPLANT
BASKET LASER NITINOL 1.9FR (BASKET) ×2 IMPLANT
BASKET STNLS GEMINI 4WIRE 3FR (BASKET) IMPLANT
BASKET ZERO TIP NITINOL 2.4FR (BASKET) IMPLANT
BSKT STON RTRVL 120 1.9FR (BASKET) ×2
BSKT STON RTRVL GEM 120X11 3FR (BASKET)
BSKT STON RTRVL ZERO TP 2.4FR (BASKET)
CATH INTERMIT  6FR 70CM (CATHETERS) ×3 IMPLANT
CLOTH BEACON ORANGE TIMEOUT ST (SAFETY) ×3 IMPLANT
DRAPE CAMERA CLOSED 9X96 (DRAPES) ×3 IMPLANT
ELECT REM PT RETURN 9FT ADLT (ELECTROSURGICAL)
ELECTRODE REM PT RTRN 9FT ADLT (ELECTROSURGICAL) IMPLANT
FIBER LASER FLEXIVA 200 (UROLOGICAL SUPPLIES) IMPLANT
FIBER LASER FLEXIVA 365 (UROLOGICAL SUPPLIES) IMPLANT
GLOVE BIOGEL M STRL SZ7.5 (GLOVE) ×3 IMPLANT
GOWN STRL REUS W/TWL LRG LVL3 (GOWN DISPOSABLE) ×3 IMPLANT
GUIDEWIRE ANG ZIPWIRE 038X150 (WIRE) ×3 IMPLANT
GUIDEWIRE STR DUAL SENSOR (WIRE) ×3 IMPLANT
IV NS IRRIG 3000ML ARTHROMATIC (IV SOLUTION) ×3 IMPLANT
PACK CYSTO (CUSTOM PROCEDURE TRAY) ×3 IMPLANT
STENT POLARIS 5FRX24 (STENTS) ×2 IMPLANT
SYRINGE 12CC LL (MISCELLANEOUS) ×2 IMPLANT
SYRINGE IRR TOOMEY STRL 70CC (SYRINGE) IMPLANT
TUBE FEEDING 8FR 16IN STR KANG (MISCELLANEOUS) ×3 IMPLANT

## 2013-10-18 NOTE — Op Note (Signed)
Joel Bauer, Joel Bauer NO.:  [id number]  MEDICAL RECORD NO.:  [id number]  LOCATION:  [facility]                         FACILITY:  [facility]  PHYSICIAN:  Alexis Frock, MD     DATE OF BIRTH:  August 19, 1933  DATE OF PROCEDURE:  10/18/2013 DATE OF DISCHARGE:                              OPERATIVE REPORT   PREOPERATIVE DIAGNOSIS:  Right ureteral stone with bothersome renal colic.  PROCEDURES: 1. Cystoscopy with right retrograde pyelogram interpretation. 2. Right ureteroscopy with basketing of stone. 3. Insertion of right ureteral stent 5 x 24 Polaris, no tether.  FINDINGS: 1. Impacted right distal ureteral stone, significant J hooking of the     ureter and ureteral edema. 2. Otherwise, unremarkable right retrograde pyelogram. 3. No additional stones on ureteroscopy above the level of the stone. 4. Trilobar prostatic hypertrophy.  ESTIMATED BLOOD LOSS:  Nil.  COMPLICATIONS:  None.  SPECIMEN:  Right ureteral stone for compositional analysis.  INDICATION:  The patient is a very pleasant 80 year old gentleman with history of prior nephrolithiasis.  He presented yesterday to our office with acute right flank pain.  He had a known right renal stone.  His constellation of symptoms was worrisome for acute renal colic.  He underwent CT stone protocol, which did corroborate a right mid ureteral stone.  His symptoms were quite bothersome.  Options were discussed including medical therapy for shockwave lithotripsy versus ureteroscopic stone manipulation.  He wished to proceed with the latter as soon as possible.  Informed consent was obtained and placed in medical record for this procedure today.  PROCEDURE IN DETAIL:  The patient being medically verified, procedure being right ureteroscopic stone manipulation was confirmed.  Procedure was carried out.  Time-out was performed.  Intravenous antibiotics administered.  General LMA anesthesia was introduced.  The patient  was placed into a low lithotomy position.  Sterile field was created by prepping and draping the patient's penis, perineum, and proximal thighs using iodine x3.  Next, cystourethroscopy was performed using a 22- French rigid cystoscope with 12-degree offset lens.  Inspection of the anterior and posterior urethra revealed significant trilobar prostatic hypertrophy.  No diverticula, calcifications, papular lesions.  The right ureteral orifice was very close to his lateral median lobe.  This was cannulated with a 6-French end-hole catheter and right retrograde pyelogram was obtained.  Right retrograde pyelogram demonstrated a single right ureter, single system right kidney.  There was a mild hydroureteronephrosis to the level of filling defect in distal ureter consistent with known stone. There was also significant J hooking of the distal ureter.  A 0.038 zip wire was advanced at the level of the upper pole and set aside as a safety wire.  Next, semi-rigid ureteroscopy was performed of the distal ureter alongside a separate Sensor working wire.  The calcification in question was encountered in the distal ureter.  There was significant edema around this consistent with impaction; however, the stone did not appear to be amenable to simple basketing.  As such, an escape basket was used to grasp the stone on its long axis and was carefully removed, set aside for compositional analysis.  The semi-rigid ureteroscopy was then performed to the entire length  of the right ureter.  No additional calcifications were found.  The semi-rigid ureteroscope was then exchanged for a 6-French ureteroscope, which allowed panendoscopic examination of the right kidney x2.  No additional calcifications were found.  The ureteroscope was removed under continuous vision and no additional calcifications were seen.  There was a previous imaging significant ureteral edema at the site of prior stone impaction in the mid  and distal ureter.  Given these findings, it was felt that ureteral stenting would be warranted.  As such, a new 5 x 24 Polaris-type stent was placed using cystoscopic and fluoroscopic guidance.  Good proximal and distal deployment were noted.  Bladder was emptied per cystoscope. Procedure was then terminated.  Please note that the patient had an 8- [place] feeding tube in the urinary bladder for pressure release during all ureteroscopic portions.  He has been taken to the postanesthesia care unit in stable condition.          ______________________________ Alexis Frock, MD     TM/MEDQ  D:  10/18/2013  T:  10/18/2013  Job:  [id number]

## 2013-10-18 NOTE — Anesthesia Postprocedure Evaluation (Signed)
  Anesthesia Post-op Note  Patient: Joel Bauer  Procedure(s) Performed: Procedure(s) (LRB): CYSTOSCOPY WITH RIGHT RETROGRADE/RIGHT URETEROSCOPY, STONE BASKETRY,  AND STENT PLACEMENT RIGHT (Right)  Patient Location: PACU  Anesthesia Type: General  Level of Consciousness: awake and alert   Airway and Oxygen Therapy: Patient Spontanous Breathing  Post-op Pain: mild  Post-op Assessment: Post-op Vital signs reviewed, Patient's Cardiovascular Status Stable, Respiratory Function Stable, Patent Airway and No signs of Nausea or vomiting  Last Vitals:  Filed Vitals:   10/18/13 0815  BP: 136/62  Pulse: 63  Temp:   Resp: 13    Post-op Vital Signs: stable   Complications: No apparent anesthesia complications

## 2013-10-18 NOTE — Progress Notes (Signed)
pacu notes: pts belongings (bag of clothes, wallet and cell phone) given to patient's son, Lysbeth Galas

## 2013-10-18 NOTE — H&P (Signed)
Joel Bauer is an 80 y.o. male.    Chief Complaint: PRe-OP Rt Ureteroscopic Stone Manipulation  HPI:    1 - Nephrolithiasis -  2010 - Rt URS for small rt ureteral stone by Joel Bauer. Karsten Bauer. 09/2013 - Rt 26m lmid ureteral stone with recurent colic by office CT no additional stones.  2 - Bilateral Renal Cysts - bilateral non-complex cysts by CT 2010, 2015 <2cm.  PMH sig for C Diff infection, TNA.   Today Joel Bauer seen to proceed with right ureteroscopic stone manipulation for his stone. No interval fevers. Most recent UA without infectious parameters.   Past Medical History  Diagnosis Date  . Adenomatous colon polyp 2001  . Diverticulosis of colon (without mention of hemorrhage) 2005  . Hypertension   . Hyperlipidemia   . Arthritis   . Shingles   . Pleurisy   . Prostatitis   . Retinal hemorrhage   . Heart murmur   . History of skin cancer   . GERD (gastroesophageal reflux disease)   . Hx of Clostridium difficile infection     3 MONTHS AGO  . History of kidney stones     Past Surgical History  Procedure Laterality Date  . Lithotripsy    . Tonsillectomy    . Cataracts removed      Family History  Problem Relation Age of Onset  . Colon cancer Father   . Colon polyps Father   . Breast cancer Mother   . Heart disease Mother   . Kidney disease Sister    Social History:  reports that he has never smoked. He has never used smokeless tobacco. He reports that he does not drink alcohol or use illicit drugs.  Allergies: No Known Allergies  Medications Prior to Admission  Medication Sig Dispense Refill  . beta carotene w/minerals (OCUVITE) tablet Take 1 tablet by mouth daily.      .Marland Kitchenlosartan (COZAAR) 100 MG tablet Take 100 mg by mouth every evening.      . AMBULATORY NON FORMULARY MEDICATION Take 250 mg by mouth 4 (four) times daily. Medication Name: Vancomycin 250 mg liquid or capsule QID x 14 days  56 capsule  0  . ibuprofen (ADVIL,MOTRIN) 200 MG tablet Take 200-400 mg  by mouth every 6 (six) hours as needed for moderate pain.       .Marland Kitchenketorolac (TORADOL) 10 MG tablet Take 10 mg by mouth every 8 (eight) hours as needed for moderate pain.      .Marland KitchenLUTEIN PO Take 1 capsule by mouth daily.      .Marland KitchenOVER THE COUNTER MEDICATION Take 1 tablet by mouth daily. pycnogenol Supplement      . saccharomyces boulardii (FLORASTOR) 250 MG capsule Take 250 mg by mouth every other day.      .Marland KitchenVITAMIN D, CHOLECALCIFEROL, PO Take 1 capsule by mouth daily.        Results for orders placed during the hospital encounter of 10/17/13 (from the past 48 hour(s))  CBC     Status: Abnormal   Collection Time    10/17/13  3:25 PM      Result Value Ref Range   WBC 7.2  4.0 - 10.5 K/uL   RBC 4.84  4.22 - 5.81 MIL/uL   Hemoglobin 14.8  13.0 - 17.0 g/dL   HCT 43.0  39.0 - 52.0 %   MCV 88.8  78.0 - 100.0 fL   MCH 30.6  26.0 - 34.0 pg   MCHC 34.4  30.0 - 36.0 g/dL   RDW 13.7  11.5 - 15.5 %   Platelets 138 (*) 150 - 400 K/uL  BASIC METABOLIC PANEL     Status: Abnormal   Collection Time    10/17/13  3:25 PM      Result Value Ref Range   Sodium 141  137 - 147 mEq/L   Potassium 4.2  3.7 - 5.3 mEq/L   Chloride 104  96 - 112 mEq/L   CO2 26  19 - 32 mEq/L   Glucose, Bld 91  70 - 99 mg/dL   BUN 20  6 - 23 mg/dL   Creatinine, Ser 1.00  0.50 - 1.35 mg/dL   Calcium 9.3  8.4 - 10.5 mg/dL   GFR calc non Af Amer 70 (*) >90 mL/min   GFR calc Af Amer 82 (*) >90 mL/min   Comment: (NOTE)     The eGFR has been calculated using the CKD EPI equation.     This calculation has not been validated in all clinical situations.     eGFR's persistently <90 mL/min signify possible Chronic Kidney     Disease.   Anion gap 11  5 - 15   Dg Chest 2 View  10/17/2013   CLINICAL DATA:  Preop for kidney stone removal  EXAM: CHEST  2 VIEW  COMPARISON:  09/18/2008  FINDINGS: Cardiomediastinal silhouette is stable. No acute infiltrate or pleural effusion. No pulmonary edema. Bony thorax is unremarkable.  IMPRESSION: No  active cardiopulmonary disease.   Electronically Signed   By: Joel Bauer M.D.   On: 10/17/2013 15:58    Review of Systems  Constitutional: Negative.  Negative for fever and chills.  HENT: Negative.   Eyes: Negative.   Respiratory: Negative.   Cardiovascular: Negative.   Gastrointestinal: Positive for nausea.  Genitourinary: Positive for flank pain.  Musculoskeletal: Negative.   Skin: Negative.   Neurological: Negative.   Endo/Heme/Allergies: Negative.   Psychiatric/Behavioral: Negative.     Blood pressure 144/73, pulse 61, temperature 97.7 F (36.5 C), temperature source Oral, resp. rate 18, height 5' 11"  (1.803 m), weight 75.751 kg (167 lb), SpO2 99.00%. Physical Exam  Constitutional: He appears well-developed and well-nourished.  HENT:  Head: Normocephalic and atraumatic.  Eyes: EOM are normal. Pupils are equal, round, and reactive to light.  Neck: Normal range of motion. Neck supple.  Cardiovascular: Normal rate and regular rhythm.   Respiratory: Effort normal.  GI: Soft. Bowel sounds are normal.  Genitourinary: Penis normal.  Musculoskeletal: Normal range of motion.  Neurological: He is alert.  Skin: Skin is warm and dry.  Psychiatric: He has a normal mood and affect. His behavior is normal. Judgment and thought content normal.     Assessment/Plan    1 - Nephrolithiasis - 43m ureteral stone with extreme colic. Does not want further trial of medical therapy.  We rediscussed ureteroscopic stone manipulation with basketing and laser-lithotripsy in detail.  We rediscussed risks including bleeding, infection, damage to kidney / ureter  bladder, rarely loss of kidney. We rediscussed anesthetic risks and rare but serious surgical complications including DVT, PE, MI, and mortality. We specifically readdressed that in 5-10% of cases a staged approach is required with stenting followed by re-attempt ureteroscopy if anatomy unfavorable.   The patient voiced understanding and wises  to proceed today as planned.   2 - Bilateral Renal Cysts - non-complex and approximately stable by serial imaging, no indication for dedicate surveillance.  Joel Bauer 10/18/2013, 6:18 AM

## 2013-10-18 NOTE — Brief Op Note (Signed)
10/18/2013  8:02 AM  PATIENT:  Joel Bauer  80 y.o. male  PRE-OPERATIVE DIAGNOSIS:  RIGHT URETERAL STONE  POST-OPERATIVE DIAGNOSIS:  RIGHT URETERAL STONE  PROCEDURE:  Procedure(s): CYSTOSCOPY WITH RIGHT RETROGRADE/RIGHT URETEROSCOPY, STONE BASKETRY,  AND STENT PLACEMENT RIGHT (Right)  SURGEON:  Surgeon(s) and Role:    * Alexis Frock, MD - Primary  PHYSICIAN ASSISTANT:   ASSISTANTS: none   ANESTHESIA:   general  EBL:     BLOOD ADMINISTERED:none  DRAINS: none   LOCAL MEDICATIONS USED:  NONE  SPECIMEN:  Source of Specimen:  right ureteral stone  DISPOSITION OF SPECIMEN:  [facility] for compositional analysis  COUNTS:  YES  TOURNIQUET:  * No tourniquets in log *  DICTATION: .Other Dictation: Dictation Number [id number]  PLAN OF CARE: Discharge to home after PACU  PATIENT DISPOSITION:  PACU - hemodynamically stable.   Delay start of Pharmacological VTE agent (>24hrs) due to surgical blood loss or risk of bleeding: not applicable

## 2013-10-18 NOTE — Transfer of Care (Signed)
Immediate Anesthesia Transfer of Care Note  Patient: Joel Bauer  Procedure(s) Performed: Procedure(s) (LRB): CYSTOSCOPY WITH RIGHT RETROGRADE/RIGHT URETEROSCOPY, STONE BASKETRY,  AND STENT PLACEMENT RIGHT (Right)  Patient Location: PACU  Anesthesia Type: General  Level of Consciousness: sedated, patient cooperative and responds to stimulation  Airway & Oxygen Therapy: Patient Spontanous Breathing and Patient connected to face mask oxgen  Post-op Assessment: Report given to PACU RN and Post -op Vital signs reviewed and stable  Post vital signs: Reviewed and stable  Complications: No apparent anesthesia complications

## 2013-10-18 NOTE — Discharge Instructions (Signed)
1 - You may have urinary urgency (bladder spasms) and bloody urine on / off with stent in place. This is normal. ° °2 - Call MD or go to ER for fever >102, severe pain / nausea / vomiting not relieved by medications, or acute change in medical status ° °

## 2013-10-20 ENCOUNTER — Telehealth: Payer: Self-pay | Admitting: *Deleted

## 2013-10-20 ENCOUNTER — Ambulatory Visit (INDEPENDENT_AMBULATORY_CARE_PROVIDER_SITE_OTHER): Payer: Medicare Other | Admitting: Nurse Practitioner

## 2013-10-20 ENCOUNTER — Encounter: Payer: Self-pay | Admitting: Nurse Practitioner

## 2013-10-20 VITALS — BP 122/62 | HR 80 | Ht 71.0 in | Wt 176.0 lb

## 2013-10-20 DIAGNOSIS — Z8719 Personal history of other diseases of the digestive system: Secondary | ICD-10-CM

## 2013-10-20 DIAGNOSIS — R109 Unspecified abdominal pain: Secondary | ICD-10-CM

## 2013-10-20 DIAGNOSIS — Z8619 Personal history of other infectious and parasitic diseases: Secondary | ICD-10-CM

## 2013-10-20 NOTE — Telephone Encounter (Signed)
I called the patient to advise that his last colonoscopy was 2005.  He had gotten a letter that is in our system in 2012 advising him it was time for him to schedule a colonoscopy.  I told him, per Tye Savoy NP-C that when he is cleared regarding the urinary stent situation, he can call us and we will schedule the colonoscopy for him. He said he would do that.

## 2013-10-20 NOTE — Patient Instructions (Signed)
Call us if you are having problems.

## 2013-10-20 NOTE — Progress Notes (Signed)
     History of Present Illness:  Patient is a 80 year old male known to [name] for history of recurrent C. Difficile and colon polyps. Several days ago patient developed RLQ discomfort and increased frequency of SOFT stools. No true diarrhea. On Monday patient underwent a cystoscopy with removal of a right ureteral stone and insertion of a right ureteral stent. Patient now not sure if the ongoing right lower quadrant discomfort is related to urologic issues or if there is something gastrointestinal going on. Urology told the patient to expect some continued right lower quadrant discomfort, he is also having hematuria..  Patient's bowel movements have been normal for almost a week now (solid, once a day). Patient has not had any recent antibiotics though will be starting Keflex when stent is removed in a couple of weeks.   Current Medications, Allergies, Past Medical History, Past Surgical History, Family History and Social History were reviewed in [employer] record.  Physical Exam: General: Pleasant, well developed , white male in no acute distress Head: Normocephalic and atraumatic Eyes:  sclerae anicteric, conjunctiva pink  Ears: Normal auditory acuity Lungs: Clear throughout to auscultation Heart: Regular rate and rhythm Abdomen: Soft, non distended, mild LLQ tenderness. No masses, no hepatomegaly. Normal bowel sounds Musculoskeletal: Symmetrical with no gross deformities  Extremities: No edema  Neurological: Alert oriented x 4, grossly nonfocal Psychological:  Alert and cooperative. Normal mood and affect  Assessment and Recommendations:  77. 80 year old male with right lower quadrant discomfort, recent increase in frequency of soft stools. Patient  concerned about recurrent C. Difficile but his BMs have been back to normal (one solid BM / day) for a week now. As far as pain, he had ureteral stone removed/stent replaced last Monday, told to expect some ongoing  lower abdominal discomfort.  No reason to believe the patient has recurrent C. Difficile at this time. He will be need antibiotics in the near future (when ureteral stent removed) and does realize this increases risk for recurrent C. Difficile infection. If patient continues to have right lower quadrant pain after resolution of urologic issues and/or he develops diarrhea then he will need further workup to include stool studies and possibly imaging studies.   2. History of diverticulitis. His abdominal exam is not overly concerning. Patient will call if lower abdominal pain persists after resolution of #3.   3. Ureteral stone, status post cystoscopy with stone removal and stent placement. Patient is due for removal of stent in a couple of weeks.  4. History of recurrent C. Difficile.  5. History of adenomatous colon polyps in 2001. Patient's last colonoscopy in 2005 negative for polyps. He received a colonoscopy recall letter in 2012. Patient will call to schedule his colonoscopy once acute urologic issues have resolved.

## 2013-10-22 ENCOUNTER — Encounter: Payer: Self-pay | Admitting: Gastroenterology

## 2013-10-23 NOTE — Progress Notes (Signed)
Reviewed and agree with management. Chai Verdejo D. Ebonique Hallstrom, M.D., FACG  

## 2013-11-17 ENCOUNTER — Ambulatory Visit (INDEPENDENT_AMBULATORY_CARE_PROVIDER_SITE_OTHER): Payer: Medicare Other | Admitting: Family Medicine

## 2013-11-17 ENCOUNTER — Encounter: Payer: Self-pay | Admitting: Family Medicine

## 2013-11-17 VITALS — BP 130/60 | HR 72 | Temp 98.1°F | Resp 16 | Ht 71.0 in | Wt 176.0 lb

## 2013-11-17 DIAGNOSIS — R002 Palpitations: Secondary | ICD-10-CM

## 2013-11-17 NOTE — Progress Notes (Signed)
Subjective:    Patient ID: Joel Bauer, male    DOB: 1933/11/01, 80 y.o.   MRN: [id number]  HPI Patient complains of palpitations in his chest last 10 days. Patient describes a fluttering in his chest that last a brief moment or so and then resolve spontaneously.  He will have a few minutes to have several of these palpitations and a red pen in his heart will resume normal sinus rhythm. He denies any chest pain shortness of breath or dyspnea on exertion. He denies any syncope or presyncope. He denies any increasing calf pain or medication changes. Overall he is feeling well. He has been under increasing stress recently as his son is moving to [place]. That is his only remaining family and he is considering selling his home and moving to [place] as well. However otherwise he is doing well. EKG obtained today in the office shows normal sinus rhythm with normal intervals and normal axis. There is no evidence of ischemia or infarction. Past Medical History  Diagnosis Date  . Adenomatous colon polyp 2001  . Diverticulosis of colon (without mention of hemorrhage) 2005  . Hypertension   . Hyperlipidemia   . Arthritis   . Shingles   . Pleurisy   . Prostatitis   . Retinal hemorrhage   . Heart murmur   . History of skin cancer   . GERD (gastroesophageal reflux disease)   . Hx of Clostridium difficile infection     3 MONTHS AGO  . History of kidney stones    Past Surgical History  Procedure Laterality Date  . Lithotripsy    . Tonsillectomy    . Cataracts removed    . Cystoscopy with retrograde pyelogram, ureteroscopy and stent placement Right 10/18/2013    Procedure: CYSTOSCOPY WITH RIGHT RETROGRADE/RIGHT URETEROSCOPY, STONE BASKETRY,  AND STENT PLACEMENT RIGHT;  Surgeon: Alexis Frock, MD;  Location: [facility];  Service: Urology;  Laterality: Right;   Current Outpatient Prescriptions on File Prior to Visit  Medication Sig Dispense Refill  . beta carotene w/minerals (OCUVITE)  tablet Take 1 tablet by mouth daily.      Marland Kitchen ibuprofen (ADVIL,MOTRIN) 200 MG tablet Take 200-400 mg by mouth every 6 (six) hours as needed for moderate pain.       Marland Kitchen losartan (COZAAR) 100 MG tablet Take 100 mg by mouth every evening.      . LUTEIN PO Take 1 capsule by mouth daily.      Marland Kitchen OVER THE COUNTER MEDICATION Take 1 tablet by mouth daily. pycnogenol Supplement      . saccharomyces boulardii (FLORASTOR) 250 MG capsule Take 250 mg by mouth every other day.      Marland Kitchen VITAMIN D, CHOLECALCIFEROL, PO Take 1 capsule by mouth daily.       No current facility-administered medications on file prior to visit.   No Known Allergies History   Social History  . Marital Status: Married    Spouse Name: N/A    Number of Children: 1  . Years of Education: N/A   Occupational History  . Retired     Social History Main Topics  . Smoking status: Never Smoker   . Smokeless tobacco: Never Used  . Alcohol Use: No  . Drug Use: No  . Sexual Activity: Not on file   Other Topics Concern  . Not on file   Social History Narrative  . No narrative on file      Review of Systems  All other systems reviewed  and are negative.      Objective:   Physical Exam  Vitals reviewed. Constitutional: He appears well-developed and well-nourished.  Neck: Neck supple. No thyromegaly present.  Cardiovascular: Normal rate, regular rhythm and normal heart sounds.   No murmur heard. Pulmonary/Chest: Effort normal and breath sounds normal. No respiratory distress. He has no wheezes. He has no rales.  Musculoskeletal: He exhibits no edema.  Lymphadenopathy:    He has no cervical adenopathy.          Assessment & Plan:  Palpitations - Plan: COMPLETE METABOLIC PANEL WITH GFR, CBC with Differential, TSH  Patient symptoms sound consistent with PVCs/PACs. I believe they have recently been triggered by the increasing stress in his life. Our plan CBC, CMP, and TSH to evaluate for underlying electrolyte  disturbances or endocrinologic causes of PVC's.  I also scheduled the patient for a 24-hour Holter monitor to confirm my suspicions. He lower monitor confirms PVCs and there are no electrolyte or laboratory abnormalities to explain the patient's symptoms, consider switching the patient to a beta blocker to help mitigate the symptoms.

## 2013-11-18 ENCOUNTER — Other Ambulatory Visit: Payer: Medicare Other

## 2013-11-19 LAB — COMPLETE METABOLIC PANEL WITH GFR
ALK PHOS: 61 U/L (ref 39–117)
ALT: 22 U/L (ref 0–53)
AST: 21 U/L (ref 0–37)
Albumin: 4.4 g/dL (ref 3.5–5.2)
BUN: 20 mg/dL (ref 6–23)
CO2: 26 mEq/L (ref 19–32)
CREATININE: 1.04 mg/dL (ref 0.50–1.35)
Calcium: 9.3 mg/dL (ref 8.4–10.5)
Chloride: 105 mEq/L (ref 96–112)
GFR, Est African American: 80 mL/min
GFR, Est Non African American: 69 mL/min
Glucose, Bld: 85 mg/dL (ref 70–99)
Potassium: 4.1 mEq/L (ref 3.5–5.3)
Sodium: 142 mEq/L (ref 135–145)
Total Bilirubin: 0.6 mg/dL (ref 0.2–1.2)
Total Protein: 6.5 g/dL (ref 6.0–8.3)

## 2013-11-19 LAB — CBC WITH DIFFERENTIAL/PLATELET
Basophils Absolute: 0.1 10*3/uL (ref 0.0–0.1)
Basophils Relative: 1 % (ref 0–1)
EOS PCT: 2 % (ref 0–5)
Eosinophils Absolute: 0.1 10*3/uL (ref 0.0–0.7)
HEMATOCRIT: 41.7 % (ref 39.0–52.0)
Hemoglobin: 14.2 g/dL (ref 13.0–17.0)
LYMPHS PCT: 28 % (ref 12–46)
Lymphs Abs: 1.5 10*3/uL (ref 0.7–4.0)
MCH: 30 pg (ref 26.0–34.0)
MCHC: 34.1 g/dL (ref 30.0–36.0)
MCV: 88.2 fL (ref 78.0–100.0)
MONO ABS: 0.4 10*3/uL (ref 0.1–1.0)
Monocytes Relative: 8 % (ref 3–12)
Neutro Abs: 3.3 10*3/uL (ref 1.7–7.7)
Neutrophils Relative %: 61 % (ref 43–77)
Platelets: 165 10*3/uL (ref 150–400)
RBC: 4.73 MIL/uL (ref 4.22–5.81)
RDW: 14.7 % (ref 11.5–15.5)
WBC: 5.4 10*3/uL (ref 4.0–10.5)

## 2013-11-19 LAB — TSH: TSH: 0.559 u[IU]/mL (ref 0.350–4.500)

## 2013-11-20 ENCOUNTER — Encounter: Payer: Self-pay | Admitting: *Deleted

## 2013-11-26 ENCOUNTER — Telehealth: Payer: Self-pay | Admitting: Family Medicine

## 2013-11-26 NOTE — Telephone Encounter (Signed)
Per Dr. Dennard Schaumann - Pt is having PVC's & PAC's as suspected and discussed in [place]. Which are benign. If pt has any further questions he will be glad to discuss in OV.  Patient aware of results and is doing good as of right now but if anything changes he will call for an appt.

## 2013-12-03 ENCOUNTER — Ambulatory Visit: Payer: Medicare Other | Admitting: Nurse Practitioner

## 2013-12-15 ENCOUNTER — Encounter: Payer: Self-pay | Admitting: Family Medicine

## 2013-12-17 ENCOUNTER — Ambulatory Visit (INDEPENDENT_AMBULATORY_CARE_PROVIDER_SITE_OTHER): Payer: Medicare Other | Admitting: Physician Assistant

## 2013-12-17 ENCOUNTER — Encounter: Payer: Self-pay | Admitting: Physician Assistant

## 2013-12-17 VITALS — BP 144/70 | HR 76 | Temp 98.2°F | Resp 18 | Wt 176.0 lb

## 2013-12-17 DIAGNOSIS — Z8619 Personal history of other infectious and parasitic diseases: Secondary | ICD-10-CM | POA: Insufficient documentation

## 2013-12-17 DIAGNOSIS — Z8719 Personal history of other diseases of the digestive system: Secondary | ICD-10-CM | POA: Insufficient documentation

## 2013-12-17 DIAGNOSIS — IMO0001 Reserved for inherently not codable concepts without codable children: Secondary | ICD-10-CM | POA: Insufficient documentation

## 2013-12-17 DIAGNOSIS — R109 Unspecified abdominal pain: Secondary | ICD-10-CM

## 2013-12-17 LAB — CBC W/MCH & 3 PART DIFF
HCT: 41.3 % (ref 39.0–52.0)
Hemoglobin: 14.8 g/dL (ref 13.0–17.0)
LYMPHS ABS: 1.4 10*3/uL (ref 0.7–4.0)
Lymphocytes Relative: 18 % (ref 12–46)
MCH: 31.2 pg (ref 26.0–34.0)
MCHC: 35.8 g/dL (ref 30.0–36.0)
MCV: 86.9 fL (ref 78.0–100.0)
NEUTROS PCT: 74 % (ref 43–77)
Neutro Abs: 5.8 10*3/uL (ref 1.7–7.7)
Platelets: 166 10*3/uL (ref 150–400)
RBC: 4.75 MIL/uL (ref 4.22–5.81)
RDW: 14.5 % (ref 11.5–15.5)
WBC mixed population: 0.7 10*3/uL (ref 0.1–1.8)
WBC: 7.8 10*3/uL (ref 4.0–10.5)
WBCMIXPER: 8 % (ref 3–18)

## 2013-12-17 LAB — URINALYSIS, ROUTINE W REFLEX MICROSCOPIC
BILIRUBIN URINE: NEGATIVE
Glucose, UA: NEGATIVE mg/dL
Hgb urine dipstick: NEGATIVE
KETONES UR: NEGATIVE mg/dL
Leukocytes, UA: NEGATIVE
Nitrite: NEGATIVE
PROTEIN: NEGATIVE mg/dL
Specific Gravity, Urine: 1.015 (ref 1.005–1.030)
UROBILINOGEN UA: 0.2 mg/dL (ref 0.0–1.0)
pH: 5.5 (ref 5.0–8.0)

## 2013-12-17 LAB — COMPLETE METABOLIC PANEL WITH GFR
ALK PHOS: 72 U/L (ref 39–117)
ALT: 23 U/L (ref 0–53)
AST: 18 U/L (ref 0–37)
Albumin: 4 g/dL (ref 3.5–5.2)
BILIRUBIN TOTAL: 0.5 mg/dL (ref 0.2–1.2)
BUN: 19 mg/dL (ref 6–23)
CO2: 27 mEq/L (ref 19–32)
CREATININE: 0.9 mg/dL (ref 0.50–1.35)
Calcium: 9.1 mg/dL (ref 8.4–10.5)
Chloride: 103 mEq/L (ref 96–112)
GFR, EST NON AFRICAN AMERICAN: 82 mL/min
GFR, Est African American: >89 mL/min
GLUCOSE: 78 mg/dL (ref 70–99)
Potassium: 4 mEq/L (ref 3.5–5.3)
Sodium: 141 mEq/L (ref 135–145)
Total Protein: 6.3 g/dL (ref 6.0–8.3)

## 2013-12-17 MED ORDER — CIPROFLOXACIN HCL 500 MG PO TABS: 500.0000 mg | ORAL_TABLET | Freq: Two times a day (BID) | ORAL | Status: DC

## 2013-12-17 MED ORDER — METRONIDAZOLE 500 MG PO TABS: 500.0000 mg | ORAL_TABLET | Freq: Three times a day (TID) | ORAL | Status: DC

## 2013-12-17 NOTE — Progress Notes (Signed)
Patient ID: Joel Bauer MRN: [id number], DOB: 1934/02/28, 80 y.o. Date of Encounter: @DATE @  Chief Complaint:  Chief Complaint  Patient presents with  . continuing abd problems    pain, hx of C-Diff    HPI: 80 y.o. year old white male  presents with complaints of abdominal discomfort.  Reports that he has had GI problems in the past and that his current symptoms feel the same as they have in the past.  He says that this episode started this past Thursday which was 6 days ago and would've been 12/11/13. States that he has been having discomfort in his low abdomen mostly towards the mid lower abdomen. Since the beginning of this episode (since last Thursday) there has been some discomfort there but it has gradually progressed since then. States that last night every time he woke up he felt discomfort there. Says that last night the discomfort was about a 5/10 every time he would wake up and feel the discomfort. Says it is better this morning and currently is only about a 1/10. Says he did call the office of his GI doctor. However was told that the doctor could not see him until December and the PA that he had been seeing there in past could not see him until next Wednesday. He says he then wasn't sure what to do, whether to go to the ER or come here-- so finally decided to come here for [place] to evaluate.  Reports that he has had no diarrhea with this episode. Says that the amount of stool is actually decreased a little compared to usual and it is a little bit loose but otherwise denies other changes with bowels. No diarrhea.   No fever, chills. He is taking probiotic.  I reviewed the GI office visit notes in Epic dated:  04/17/13,   05/14/13,   06/06/13,   06/16/13. Diagnoses included in all of those office visits were--- diverticulitis and C. difficile.    Past Medical History  Diagnosis Date  . Adenomatous colon polyp 2001  . Diverticulosis of colon (without mention of hemorrhage)  2005  . Hypertension   . Hyperlipidemia   . Arthritis   . Shingles   . Pleurisy   . Prostatitis   . Retinal hemorrhage   . Heart murmur   . History of skin cancer   . GERD (gastroesophageal reflux disease)   . Hx of Clostridium difficile infection     3 MONTHS AGO  . History of kidney stones      Home Meds: Outpatient Prescriptions Prior to Visit  Medication Sig Dispense Refill  . beta carotene w/minerals (OCUVITE) tablet Take 1 tablet by mouth daily.      Marland Kitchen ibuprofen (ADVIL,MOTRIN) 200 MG tablet Take 200-400 mg by mouth every 6 (six) hours as needed for moderate pain.       Marland Kitchen losartan (COZAAR) 100 MG tablet Take 100 mg by mouth every evening.      . LUTEIN PO Take 1 capsule by mouth daily.      Marland Kitchen OVER THE COUNTER MEDICATION Take 1 tablet by mouth daily. pycnogenol Supplement      . saccharomyces boulardii (FLORASTOR) 250 MG capsule Take 250 mg by mouth every other day.      Marland Kitchen VITAMIN D, CHOLECALCIFEROL, PO Take 1 capsule by mouth daily.       No facility-administered medications prior to visit.    Allergies: No Known Allergies  History   Social History  .  Marital Status: Married    Spouse Name: N/A    Number of Children: 1  . Years of Education: N/A   Occupational History  . Retired     Social History Main Topics  . Smoking status: Never Smoker   . Smokeless tobacco: Never Used  . Alcohol Use: No  . Drug Use: No  . Sexual Activity: Not on file   Other Topics Concern  . Not on file   Social History Narrative  . No narrative on file    Family History  Problem Relation Age of Onset  . Colon cancer Father   . Colon polyps Father   . Breast cancer Mother   . Heart disease Mother   . Kidney disease Sister      Review of Systems:  See HPI for pertinent ROS. All other ROS negative.    Physical Exam: Blood pressure 144/70, pulse 76, temperature 98.2 F (36.8 C), temperature source Oral, resp. rate 18, weight 176 lb (79.833 kg)., Body mass index is 24.56  kg/(m^2). General: WNWD WM. Appears in no acute distress. Neck: Supple. No thyromegaly. No lymphadenopathy. Lungs: Clear bilaterally to auscultation without wheezes, rales, or rhonchi. Breathing is unlabored. Heart: RRR with S1 S2. No murmurs, rubs, or gallops. Abdomen: Soft, non-distended with normoactive bowel sounds. No hepatomegaly. No rebound/guarding. No obvious abdominal masses. There is tenderness with palpation of the left lower quadrant and lower mid abdomen. No tenderness towards the right lower quadrant and no other areas of tenderness. Musculoskeletal:  Strength and tone normal for age. Extremities/Skin: Warm and dry. Neuro: Alert and oriented X 3. Moves all extremities spontaneously. Gait is normal. CNII-XII grossly in tact. Psych:  Responds to questions appropriately with a normal affect.   Results for orders placed in visit on 12/17/13  CBC [facility] & 3 PART DIFF      Result Value Ref Range   WBC 7.8  4.0 - 10.5 K/uL   RBC 4.75  4.22 - 5.81 MIL/uL   Hemoglobin 14.8  13.0 - 17.0 g/dL   HCT 41.3  39.0 - 52.0 %   MCV 86.9  78.0 - 100.0 fL   MCH 31.2  26.0 - 34.0 pg   MCHC 35.8  30.0 - 36.0 g/dL   RDW 14.5  11.5 - 15.5 %   Platelets 166  150 - 400 K/uL   Neutrophils Relative % 74  43 - 77 %   Neutro Abs 5.8  1.7 - 7.7 K/uL   Lymphocytes Relative 18  12 - 46 %   Lymphs Abs 1.4  0.7 - 4.0 K/uL   WBC mixed population % 8  3 - 18 %   WBC mixed population 0.7  0.1 - 1.8 K/uL  URINALYSIS, ROUTINE W REFLEX MICROSCOPIC      Result Value Ref Range   Color, Urine YELLOW  YELLOW   APPearance CLEAR  CLEAR   Specific Gravity, Urine 1.015  1.005 - 1.030   pH 5.5  5.0 - 8.0   Glucose, UA NEG  NEG mg/dL   Bilirubin Urine NEG  NEG   Ketones, ur NEG  NEG mg/dL   Hgb urine dipstick NEG  NEG   Protein, ur NEG  NEG mg/dL   Urobilinogen, UA 0.2  0.0 - 1.0 mg/dL   Nitrite NEG  NEG   Leukocytes, UA NEG  NEG     ASSESSMENT AND PLAN:  80 y.o. year old male with  1. Abdominal pain,  unspecified site - CBC w/[facility] &  3 Part Diff - Urinalysis, Routine w reflex microscopic - COMPLETE METABOLIC PANEL WITH GFR - Clostridium Difficile by PCR; Future - ciprofloxacin (CIPRO) 500 MG tablet; Take 1 tablet (500 mg total) by mouth 2 (two) times daily.  Dispense: 20 tablet; Refill: 0 - metroNIDAZOLE (FLAGYL) 500 MG tablet; Take 1 tablet (500 mg total) by mouth 3 (three) times daily.  Dispense: 30 tablet; Refill: 0  2. History of Clostridium difficile - CBC w/[facility] & 3 Part Diff - Clostridium Difficile by PCR; Future  3. History of diverticulitis - CBC w/[facility] & 3 Part Diff - ciprofloxacin (CIPRO) 500 MG tablet; Take 1 tablet (500 mg total) by mouth 2 (two) times daily.  Dispense: 20 tablet; Refill: 0 - metroNIDAZOLE (FLAGYL) 500 MG tablet; Take 1 tablet (500 mg total) by mouth 3 (three) times daily.  Dispense: 30 tablet; Refill: 0  Reviewed urinalysis and CBC results with patient while he was here in the office. Reviewed they were normal. I discussed with patient that clinically his symptoms are consistent with diverticulitis. Discussed that we could obtain CT scan for confirmation.-- Especially given that he has had C. difficile in the past secondary to multiple antibiotics and secondary to being on Cipro and Flagyl for diverticulitis in the past. He deferred CT scan. He is to start taking the Cipro and Flagyl and take as directed and complete the course of 10 days. He is to go to a clear liquid diet and then gradually progress to bland diet. He is to follow up immediately if pain worsens or develops fever or if symptoms are not completely resolved at completion of medication. Continue the probiotic.   [address] [place], [place], BSFM 12/17/2013 1:23 PM

## 2013-12-18 ENCOUNTER — Other Ambulatory Visit: Payer: Medicare Other

## 2013-12-18 DIAGNOSIS — R109 Unspecified abdominal pain: Secondary | ICD-10-CM

## 2013-12-18 DIAGNOSIS — IMO0001 Reserved for inherently not codable concepts without codable children: Secondary | ICD-10-CM

## 2013-12-19 LAB — CLOSTRIDIUM DIFFICILE BY PCR: Toxigenic C. Difficile by PCR: DETECTED — CR

## 2013-12-22 ENCOUNTER — Telehealth: Payer: Self-pay | Admitting: *Deleted

## 2013-12-22 DIAGNOSIS — K5732 Diverticulitis of large intestine without perforation or abscess without bleeding: Secondary | ICD-10-CM

## 2013-12-22 DIAGNOSIS — A0472 Enterocolitis due to Clostridium difficile, not specified as recurrent: Secondary | ICD-10-CM

## 2013-12-22 DIAGNOSIS — R1084 Generalized abdominal pain: Secondary | ICD-10-CM

## 2013-12-22 NOTE — Telephone Encounter (Signed)
This has been addressed in other section of [facility]--in Lab Results section. Result note has already been noted and addressed this--to complete Flagyl and continue Probiotic and F/U with GI.

## 2013-12-22 NOTE — Telephone Encounter (Signed)
Message copied by Olena Mater on Mon Dec 22, 2013 11:07 AM ------      Message from: Dena Billet      Created: Sat Dec 20, 2013  8:30 AM       At the patient's recent office visit, we reviewed and discussed the fact that he has had multiple evaluations with GI--- some of those were diagnosed as diverticulitis and some were diagnosed as C. Difficile.      The patient's recent visit with me, he was having no diarrhea but was having left lower quadrant tenderness.      Prescribed Cipro and Flagyl to treat diverticulitis.      Tell him that now the C. difficile test is also positive. C. difficile is usually treated with Flagyl which he is already on.      Tell him to go ahead and complete the medications that I have prescribed.      However, tell him that I do want him to schedule followup visit with [name] at [facility]. Please order a referral for this today--so he will have followup with them in case he continues to have symptoms. ------

## 2013-12-22 NOTE — Telephone Encounter (Signed)
Spoke to patient about results and need to finish antibiotics.  GI referral initiated

## 2013-12-22 NOTE — Telephone Encounter (Signed)
Received call from [facility] lab stating that pt results showed that C-Diff has been detected.

## 2013-12-23 ENCOUNTER — Ambulatory Visit: Payer: Medicare Other | Admitting: Nurse Practitioner

## 2013-12-25 ENCOUNTER — Encounter: Payer: Self-pay | Admitting: Physician Assistant

## 2013-12-25 ENCOUNTER — Telehealth: Payer: Self-pay | Admitting: Gastroenterology

## 2013-12-25 ENCOUNTER — Ambulatory Visit (INDEPENDENT_AMBULATORY_CARE_PROVIDER_SITE_OTHER): Payer: Medicare Other | Admitting: Physician Assistant

## 2013-12-25 VITALS — BP 130/62 | HR 76 | Ht 71.0 in | Wt 174.0 lb

## 2013-12-25 DIAGNOSIS — A0472 Enterocolitis due to Clostridium difficile, not specified as recurrent: Secondary | ICD-10-CM

## 2013-12-25 DIAGNOSIS — A047 Enterocolitis due to Clostridium difficile: Secondary | ICD-10-CM

## 2013-12-25 MED ORDER — METRONIDAZOLE 250 MG PO TABS: 250.0000 mg | ORAL_TABLET | Freq: Four times a day (QID) | ORAL | Status: AC

## 2013-12-25 NOTE — Progress Notes (Signed)
Subjective:    Patient ID: Joel Bauer, male    DOB: [birth year], 80 y.o.   MRN: [id number]  HPI  Rodolphe is a pleasant 80 year old white male known to [name]. He has history of colon polyps and diverticular disease. His last colonoscopy was done in 2006 and showed left-sided diverticulosis, no polyps. Unfortunately he has had problems with recurrent C. difficile colitis. He initially had an episode in the summer of 2014 which was treated with a course of metronidazole. He developed C. difficile again in January of 2015 after he he had been treated with a course of Cipro and Flagyl for diverticulitis. He was treated with Flagyl for the C. difficile which she responded to but did not clear and then given a course of vancomycin for suspected C. difficile in March of 2015. He has not had any problems since March until about 10 days ago. He says he took a very short course of antibiotics or earlier in the summer after he had had a urologic procedure. About 10 days ago he developed some centralized suprapubic abdominal discomfort which she says felt just like his previous C. difficile. He did not have any associated diarrhea no fever chills nausea or vomiting. He was unable to be seen here and went to his primary care physician on 12/17/2013. He was felt to have diverticulitis and was given a course of Cipro and Flagyl. He then did stool for C. difficile PCR which has returned positive. He continues on Cipro and Flagyl at this time and says that he is feeling better. He has minimal residual suprapubic discomfort. He has had an occasional sweats over the past week no documented fevers again appetite is okay and that he feels that the pain has improved but has not completely resolved.     Review of Systems  Constitutional: Positive for fatigue.  HENT: Negative.   Eyes: Negative.   Respiratory: Negative.   Cardiovascular: Negative.   Gastrointestinal: Positive for abdominal pain.  Endocrine:  Negative.   Genitourinary: Negative.   Musculoskeletal: Negative.   Allergic/Immunologic: Negative.   Neurological: Negative.   Hematological: Negative.   Psychiatric/Behavioral: Negative.    Outpatient Prescriptions Prior to Visit  Medication Sig Dispense Refill  . beta carotene w/minerals (OCUVITE) tablet Take 1 tablet by mouth daily.      . ciprofloxacin (CIPRO) 500 MG tablet Take 1 tablet (500 mg total) by mouth 2 (two) times daily.  20 tablet  0  . ibuprofen (ADVIL,MOTRIN) 200 MG tablet Take 200-400 mg by mouth every 6 (six) hours as needed for moderate pain.       Marland Kitchen losartan (COZAAR) 100 MG tablet Take 100 mg by mouth every evening.      . LUTEIN PO Take 1 capsule by mouth daily.      . metroNIDAZOLE (FLAGYL) 500 MG tablet Take 1 tablet (500 mg total) by mouth 3 (three) times daily.  30 tablet  0  . OVER THE COUNTER MEDICATION Take 1 tablet by mouth daily. pycnogenol Supplement      . oxyCODONE-acetaminophen (PERCOCET/ROXICET) 5-325 MG per tablet Take 1 tablet by mouth. q 4-6 hrs as needed      . saccharomyces boulardii (FLORASTOR) 250 MG capsule Take 250 mg by mouth every other day.      Marland Kitchen VITAMIN D, CHOLECALCIFEROL, PO Take 1 capsule by mouth daily.       No facility-administered medications prior to visit.   No Known Allergies Patient Active Problem List  Diagnosis Date Noted  . History of Clostridium difficile 12/17/2013  . History of diverticulitis 12/17/2013  . Hx of pseudomembranous enterocolitis 04/17/2013  . Abdominal pain, unspecified site 04/13/2013  . Glaucoma    History  Substance Use Topics  . Smoking status: Never Smoker   . Smokeless tobacco: Never Used  . Alcohol Use: No   family history includes Breast cancer in his mother; Colon cancer in his father; Colon polyps in his father; Heart disease in his mother; Kidney disease in his sister.      Objective:   Physical Exam well-developed white male in no acute distress, pleasant blood pressure 130/62  pulse 76 height 5 foot 11 weight 174. HEENT; nontraumatic normocephalic EOMI PERRLA sclera anicteric, Supple ;no DD, Cardiovascular; regular rate and rhythm with S1-S2 no murmur or gallop, Pulmonary; clear bilaterally, Abdomen ;soft he is mildly tender in left lower quadrant and suprapubic area there is no guarding or rebound no palpable mass or hepatosplenomegaly bowel sounds are present, Rectal; exam not done, Extremities; no clubbing cyanosis or edema skin warm and dry, Psych; mood and affect appropriate        Assessment & Plan:  #42  80 year old male with recurrent C. difficile colitis. Patient has had 2 previous episodes the last was greater than 6 months ago. He is currently on course of Cipro and Flagyl because of concern for diverticulitis however C. differential PCR is positive and patient says the symptoms fill despite prior C. difficile though he has not had diarrhea.  Plan; Stop Cipro Change metronidazole to 250 mg by mouth 4 times daily and ask  patient completed another 10 day course to assure clearance Start  florastor  one by mouth twice daily x1 month and then have asked him to stay on a probiotic daily thereafter He is asked to call should he have any relapse of symptoms and/or if he does not feel that he has cleared his symptoms when he finishes the current course of metronidazole.

## 2013-12-25 NOTE — Patient Instructions (Addendum)
Stop Cipro.  Finish the current prescription of Metronidazole. We are sending a new prescription of Metronidazole for 250 mg, take 1 tab 4 times daily for 10 days.  Take with food. [facility]. Florastor twice daily x 1 month then take it daily ongoing.    Call us back at any point if symptoms are not improving.

## 2013-12-25 NOTE — Telephone Encounter (Signed)
Labs indicate C-diff positive 12/18/13. Patient had cancelled his appt 12/23/13 stating he was seeing his PCP. Called to patient and got his voicemail. Left message offering an appointment today with A.Esterwood at 3 pm

## 2013-12-26 NOTE — Telephone Encounter (Signed)
Patient came in for the appointment

## 2014-01-05 ENCOUNTER — Telehealth: Payer: Self-pay | Admitting: Physician Assistant

## 2014-01-05 NOTE — Telephone Encounter (Signed)
Patient states he completed his antibiotics yesterday. He reports no discomfort for 5 days until today. Today, he is feeling some discomfort. He is asking if he should be rechecked for c.diff or just wait it out for a few days. Please, advise.

## 2014-01-05 NOTE — Telephone Encounter (Signed)
I think we should wait it out 3-4 days - the test for cdiff will stay positive for a while- ask him to call back Thursday if still feeling uncomfortable or having diarrhea

## 2014-01-05 NOTE — Telephone Encounter (Signed)
Patient advised. Agrees to this plan.

## 2014-01-07 ENCOUNTER — Ambulatory Visit: Payer: Medicare Other | Admitting: Nurse Practitioner

## 2014-01-08 ENCOUNTER — Telehealth: Payer: Self-pay | Admitting: *Deleted

## 2014-01-08 MED ORDER — DICYCLOMINE HCL 20 MG PO TABS: ORAL_TABLET | ORAL | Status: DC

## 2014-01-08 MED ORDER — VANCOMYCIN HCL 125 MG PO CAPS: ORAL_CAPSULE | ORAL | Status: DC

## 2014-01-08 NOTE — Telephone Encounter (Signed)
Patient calling to report diarrhea this AM and abdominal pain. States he broke out in a cold sweat. Please, advise.

## 2014-01-08 NOTE — Telephone Encounter (Signed)
As written, I discussed with Ms. Joel Bauer. He is going to need advance practitioner or M.D. followup in one to 2 weeks to make sure he is improving.

## 2014-01-08 NOTE — Telephone Encounter (Signed)
Spoke with Dr. Carlean Purl re: patient symptoms. Vancomycin 125 mg po QID x 14 days, Dicyclomine 20 mg po every 6 hours prn cramping, continue Florastor. Rx's called to [facility]. Patient aware.

## 2014-01-09 NOTE — Telephone Encounter (Signed)
Spoke with patient and scheduled OV with Nicoletta Ba, PA on 01/29/14 at 10:00 AM.

## 2014-01-09 NOTE — Telephone Encounter (Signed)
Left a message for patient to call back. 

## 2014-01-13 ENCOUNTER — Other Ambulatory Visit: Payer: Self-pay | Admitting: Family Medicine

## 2014-01-29 ENCOUNTER — Encounter: Payer: Self-pay | Admitting: Physician Assistant

## 2014-01-29 ENCOUNTER — Ambulatory Visit (INDEPENDENT_AMBULATORY_CARE_PROVIDER_SITE_OTHER): Payer: Medicare Other | Admitting: Physician Assistant

## 2014-01-29 VITALS — BP 122/78 | HR 72 | Ht 71.0 in | Wt 175.0 lb

## 2014-01-29 DIAGNOSIS — Z8719 Personal history of other diseases of the digestive system: Secondary | ICD-10-CM

## 2014-01-29 DIAGNOSIS — IMO0001 Reserved for inherently not codable concepts without codable children: Secondary | ICD-10-CM

## 2014-01-29 NOTE — Patient Instructions (Signed)
Continue probiotic.  Call in January or February for a colon with Dr.Kaplan. Call us if symptoms return.

## 2014-01-29 NOTE — Progress Notes (Signed)
Subjective:    Patient ID: Joel Bauer, male    DOB: Sep 13, 1933, 80 y.o.   MRN: [id number]  HPI  Joel Bauer is a pleasant 80 year old white male known to Dr. Deatra Bauer. He has history of diverticular disease previous diverticulitis,and prior history of C. Difficile colitis. He was seen in the office on 12/25/2013 after he had been placed on a recent course of Cipro and Flagyl by his PCP for possible diverticulitis. The patient states that he had developed recurrent abdominal pain which is very reminiscent of the pain he had with C. Difficile but had not had any diarrhea. When we saw him and check stool for C. Difficile and indeed this was positive again. Cipro was stopped and he was advised to take Flagyl 250 mg by mouth 4 times daily for an additional 13 days. Also placed him on floor store. He has finished antibiotic and comes in today for follow-up. He says he is feeling better and is not having any abdominal discomfort he has had some increasing gas and no diarrhea and is having 2-3 bowel movements per day. Appetite is good and weight is stable no fever or chills. Last colonoscopy had been done January 2005 with  finding of left colon diverticulosis and otherwise negative.    Review of Systems  Constitutional: Negative.   HENT: Negative.   Eyes: Negative.   Respiratory: Negative.   Cardiovascular: Negative.   Gastrointestinal: Negative.   Endocrine: Negative.   Genitourinary: Negative.   Musculoskeletal: Negative.   Skin: Negative.   Allergic/Immunologic: Negative.   Neurological: Negative.   Hematological: Negative.   Psychiatric/Behavioral: Negative.    Outpatient Prescriptions Prior to Visit  Medication Sig Dispense Refill  . beta carotene w/minerals (OCUVITE) tablet Take 1 tablet by mouth daily.    Marland Kitchen ibuprofen (ADVIL,MOTRIN) 200 MG tablet Take 200-400 mg by mouth every 6 (six) hours as needed for moderate pain.     Marland Kitchen losartan (COZAAR) 100 MG tablet Take 100 mg by mouth every  evening.    . LUTEIN PO Take 1 capsule by mouth daily.    Marland Kitchen OVER THE COUNTER MEDICATION Take 1 tablet by mouth daily. pycnogenol Supplement    . oxyCODONE-acetaminophen (PERCOCET/ROXICET) 5-325 MG per tablet Take 1 tablet by mouth. q 4-6 hrs as needed    . saccharomyces boulardii (FLORASTOR) 250 MG capsule Take 250 mg by mouth every other day.    Marland Kitchen VITAMIN D, CHOLECALCIFEROL, PO Take 1 capsule by mouth daily.    Marland Kitchen dicyclomine (BENTYL) 20 MG tablet Take one po every 6 hours prn 60 tablet 0  . ciprofloxacin (CIPRO) 500 MG tablet Take 1 tablet (500 mg total) by mouth 2 (two) times daily. 20 tablet 0  . losartan (COZAAR) 100 MG tablet TAKE 1 TABLET BY MOUTH EVERY DAY 30 tablet 5  . metroNIDAZOLE (FLAGYL) 500 MG tablet Take 1 tablet (500 mg total) by mouth 3 (three) times daily. 30 tablet 0  . vancomycin (VANCOCIN HCL) 125 MG capsule Take 125 mg po QID x 14 days. May compound 56 capsule 0   No facility-administered medications prior to visit.   No Known Allergies Patient Active Problem List   Diagnosis Date Noted  . History of Clostridium difficile 12/17/2013  . History of diverticulitis 12/17/2013  . Hx of pseudomembranous enterocolitis 04/17/2013  . Abdominal pain, unspecified site 04/13/2013  . Glaucoma    History  Substance Use Topics  . Smoking status: Never Smoker   . Smokeless tobacco: Never Used  .  Alcohol Use: No    family history includes Breast cancer in his mother; Colon cancer in his father; Colon polyps in his father; Heart disease in his mother; Kidney disease in his sister.     Objective:   Physical Exam Well-developed elderly white male in no acute distress, pleasant blood pressure 122/78 pulse 72 height 5 foot 11 weight 175. Cardiovascular; regular rate and rhythm with S1-S2 no murmur rub or gallop, Pulmonary; clear bilaterally, Abdomen ;soft nontender nondistended bowel sounds are active of a mass or hepatosplenomegaly        Assessment & Plan:  #68  80 year old  male with history of recurrent C. Difficile colitis. Initial episode in September 2014 and then recurrence in January 2015 and has just completed a course of metronidazole for recurrence in October 2015. His last 2 episodes were precipitated by treatment with Cipro and Flagyl for possible diverticulitis. Symptoms currently resolved after completing a course of metronidazole and floor store  Plan; as patient has no current symptoms I do not feel he needs to be retested for C. Difficile, he will call if he develops any recurrence of abdominal cramping or loose stools Continue a probiotic on a daily basis We also discussed avoidance for repeated courses of antibiotics if at all possible He is due for follow-up colonoscopy in January and is asked to call back in January or February to schedule directly for colonoscopy with Dr. Deatra Bauer. He'll follow up in the office as needed

## 2014-01-30 ENCOUNTER — Encounter: Payer: Self-pay | Admitting: Physician Assistant

## 2014-02-02 ENCOUNTER — Telehealth: Payer: Self-pay | Admitting: Physician Assistant

## 2014-02-02 NOTE — Telephone Encounter (Signed)
Patient calls with a 24 hour history of diarrhea and pain in the lower abdomen. He states he feels the same way as he did with C-diff. He has had 4 diarrhea stools. Please advise

## 2014-02-02 NOTE — Progress Notes (Signed)
Reviewed and agree with management. Kedra Mcglade D. Lavon Horn, M.D., FACG  

## 2014-02-02 NOTE — Telephone Encounter (Signed)
Yes-- certainly.

## 2014-02-03 ENCOUNTER — Other Ambulatory Visit: Payer: Self-pay

## 2014-02-03 DIAGNOSIS — A0472 Enterocolitis due to Clostridium difficile, not specified as recurrent: Secondary | ICD-10-CM

## 2014-02-03 NOTE — Telephone Encounter (Signed)
I looked at the phone message where he is asking if he can be tested for cdiff again- and answer was certainly- so - he needs stool for cdiff pcr brought in asap - would like to wait for that before  I restart abx. He should take Florastor twice daily til I get stool results back

## 2014-02-03 NOTE — Telephone Encounter (Signed)
Amy what do you want me to do with this patient?

## 2014-02-03 NOTE — Telephone Encounter (Signed)
I have left message for the patient to call back 

## 2014-02-03 NOTE — Telephone Encounter (Signed)
Patient notified-he does not have the collection kit at home-he will come by tomorrow

## 2014-02-04 ENCOUNTER — Other Ambulatory Visit: Payer: Medicare Other

## 2014-02-04 DIAGNOSIS — A0472 Enterocolitis due to Clostridium difficile, not specified as recurrent: Secondary | ICD-10-CM

## 2014-02-05 ENCOUNTER — Telehealth: Payer: Self-pay | Admitting: *Deleted

## 2014-02-05 ENCOUNTER — Telehealth: Payer: Self-pay | Admitting: Physician Assistant

## 2014-02-05 ENCOUNTER — Other Ambulatory Visit: Payer: Self-pay

## 2014-02-05 LAB — CLOSTRIDIUM DIFFICILE BY PCR: Toxigenic C. Difficile by PCR: DETECTED — CR

## 2014-02-05 MED ORDER — VANCOMYCIN HCL 250 MG PO CAPS: ORAL_CAPSULE | ORAL | Status: DC

## 2014-02-05 NOTE — Telephone Encounter (Signed)
I called [facility] and did a prior auth for the Vancomycin 250 mg.  # 98, gave them the instructions .  This was approved with PA # [id number].  Per the pharmacist at [address], [place] his prescription will be $10.00.  I called the patient to let him know he can pick it up.  He did comment the last prescription at [facility] was $95.00.

## 2014-02-06 NOTE — Telephone Encounter (Signed)
See phone note from 02-05-2014 at 5 15 PM.

## 2014-04-05 ENCOUNTER — Telehealth: Payer: Self-pay | Admitting: Gastroenterology

## 2014-04-05 NOTE — Telephone Encounter (Signed)
Having recurrent diarrhea over the past 2 days which is becoming increasingly severe.  Symptoms are similar to his prior episodes of pseudomembranous colitis.  This is at least his third recurrence.  Plan to begin dificid 299mg  twice a day for 10 days

## 2014-04-06 ENCOUNTER — Encounter: Payer: Self-pay | Admitting: *Deleted

## 2014-04-06 ENCOUNTER — Telehealth: Payer: Self-pay | Admitting: Gastroenterology

## 2014-04-06 NOTE — Telephone Encounter (Signed)
Patient appoved for DIFICID 10 day supply    [id number] Ref Number  Called patient to inform med approved

## 2014-04-06 NOTE — Telephone Encounter (Signed)
Dr Deatra Ina, Please prescribe something the insurance will cover...Marland KitchenMarland Kitchen

## 2014-04-13 ENCOUNTER — Telehealth: Payer: Self-pay | Admitting: Gastroenterology

## 2014-04-13 NOTE — Telephone Encounter (Signed)
Patient is on ATB's as ordered for a recurrent C-diff. An appointment made for the patient to discuss options if C-diff infection returns.

## 2014-05-01 ENCOUNTER — Other Ambulatory Visit: Payer: 59

## 2014-05-01 ENCOUNTER — Ambulatory Visit (INDEPENDENT_AMBULATORY_CARE_PROVIDER_SITE_OTHER): Payer: 59 | Admitting: Gastroenterology

## 2014-05-01 ENCOUNTER — Encounter: Payer: Self-pay | Admitting: Gastroenterology

## 2014-05-01 VITALS — BP 138/60 | HR 72 | Ht 71.0 in | Wt 177.0 lb

## 2014-05-01 DIAGNOSIS — Z8719 Personal history of other diseases of the digestive system: Secondary | ICD-10-CM

## 2014-05-01 DIAGNOSIS — Z8619 Personal history of other infectious and parasitic diseases: Secondary | ICD-10-CM

## 2014-05-01 DIAGNOSIS — R197 Diarrhea, unspecified: Secondary | ICD-10-CM

## 2014-05-01 DIAGNOSIS — IMO0001 Reserved for inherently not codable concepts without codable children: Secondary | ICD-10-CM

## 2014-05-01 MED ORDER — VANCOMYCIN HCL 250 MG PO CAPS: ORAL_CAPSULE | ORAL | Status: DC

## 2014-05-01 NOTE — Progress Notes (Signed)
      History of Present Illness:  [name] has returned for evaluation of recurrent diarrhea.  Proximal  3 weeks ago he developed recurrent diarrhea with lower abdominal pain and was empirically placed on dificid.  While on the antibiotic symptoms subsided.  3-4 days following its conclusion he developed diarrhea.  He now has diarrhea up to 10 times a day along with crampy lower abdominal pain and soreness.  He has not taken any other antibiotics.    Review of Systems: Pertinent positive and negative review of systems were noted in the above HPI section. All other review of systems were otherwise negative.    Current Medications, Allergies, Past Medical History, Past Surgical History, Family History and Social History were reviewed in [place] record  Vital signs were reviewed in today's medical record. Physical Exam: General: Well developed , well nourished, no acute distress Skin: anicteric Head: Normocephalic and atraumatic Eyes:  sclerae anicteric, EOMI Ears: Normal auditory acuity Mouth: No deformity or lesions Lungs: Clear throughout to auscultation Heart: Regular rate and rhythm; no murmurs, rubs or bruits Abdomen: Soft,  non distended. No masses, hepatosplenomegaly or hernias noted. Normal Bowel sounds.  There is mild tenderness bilaterally in the lower quadrants Rectal:deferred Musculoskeletal: Symmetrical with no gross deformities  Pulses:  Normal pulses noted Extremities: No clubbing, cyanosis, edema or deformities noted Neurological: Alert oriented x 4, grossly nonfocal Psychological:  Alert and cooperative. Normal mood and affect  See Assessment and Plan under Problem List

## 2014-05-01 NOTE — Assessment & Plan Note (Signed)
The patient has had recurrent episodes of documented Pseudomonas colitis and has been treated with various regimens including Flagyl, vancomycin for 2 weeks, vancomycin for a month, and Dificid.  Despite this Joel Bauer is developing recurrent symptoms.  At this point, should Joel Bauer prove to have a recurrent infection, I believe that Joel Bauer should undergo a fecal transplantation.  Recommendations #1 repeat stool for C. difficile toxin #2 begin vancomycin 2050 mg 4 times a day #3 if stools are negative will proceed with sigmoidoscopy to document whether Joel Bauer has colitis

## 2014-05-01 NOTE — Patient Instructions (Signed)
Go to the basement for your test kit Your antibiotic is being sent to your pharmacy 

## 2014-05-02 LAB — CLOSTRIDIUM DIFFICILE BY PCR: Toxigenic C. Difficile by PCR: DETECTED — CR

## 2014-05-04 ENCOUNTER — Telehealth: Payer: Self-pay | Admitting: Gastroenterology

## 2014-05-05 ENCOUNTER — Other Ambulatory Visit: Payer: Self-pay

## 2014-05-05 DIAGNOSIS — A0472 Enterocolitis due to Clostridium difficile, not specified as recurrent: Secondary | ICD-10-CM

## 2014-05-05 NOTE — Telephone Encounter (Signed)
Patient is agreeable to this. Please advise on an appointment. He is presently on Vanc. His donor lives elsewhere. What are the options for donors?

## 2014-05-05 NOTE — Telephone Encounter (Signed)
Advised the patient he will be referred to Dr Baxter Flattery first. Referral made to ID. Patient is aware.

## 2014-05-06 ENCOUNTER — Telehealth: Payer: Self-pay

## 2014-05-06 ENCOUNTER — Telehealth: Payer: Self-pay | Admitting: *Deleted

## 2014-05-06 NOTE — Telephone Encounter (Signed)
Patient called requesting earlier appointment with Dr Baxter Flattery. He currently has 10 days of Vancomycin left and his appointment is not until June 08, 2014.  He has concerns about being without medication.   Appointment changed to 05-08-14 with Dr Johnnye Sima.   I just realized the patient is being referred for possible stool transplant and will need to see Dr Baxter Flattery.  I called and informed him to keep the March appointment and call his provider to see what could be done between the scheduled appointment and his medication end date.     Laverle Patter, RN

## 2014-05-06 NOTE — Telephone Encounter (Signed)
[facility] - Client [place] Patient Name: Joel Bauer Gender: Male DOB: 1933/07/02 Age: 81 Y 21 M 30 D Return Phone Number: [phone] (Primary) Address: City/State/Zip: [place] Client [facility] - Client Client Site [facility] - Night Contact Type Call Call Type Triage / Clinical Caller Name Elder Negus  [facility] Relationship To Patient Other Return Phone Number [phone] (Primary) Chief Complaint Lab Result (Critical) Initial Comment Caller states she needs to report a critical lab result. CB# [phone] option 3 Nurse Assessment Nurse: Justine Null, RN, Rodena Piety Date/Time (Eastern Time): 05/03/2014 10:13:14 AM Is there an on-call provider listed? ---Yes Please list name of person reporting value (Lab Employee) and a contact number. ---Caller states she needs to report a critical lab result. CB# [phone] option 3 Elder Negus and has a critical value Please document the following items: Lab name Lab value (read back to lab to verify) Reference range for lab value Date and time blood was drawn ---positive c diff toxin and has and the specimen was collected on 05/01/2014 1028 AM Please collect the patient contact information from the lab. (name, phone number and address) ---as on the chart Guidelines Guideline Title Affirmed Question Affirmed Notes Nurse Date/Time ([place] Time) [place]. Time Eilene Ghazi Time) Disposition Final User 05/03/2014 10:24:21 AM Paged On Call back to [facility], RN, Rodena Piety 05/03/2014 10:46:19 AM Paged On Call back to [facility], RN, Rodena Piety 05/03/2014 11:21:13 AM Call Completed Justine Null, RN, Rodena Piety 05/03/2014 11:21:25 AM Clinical Call Yes Justine Null, RN, Rodena Piety After Care Instructions Given Call Event Type User Date / Time Description Paging DoctorName DoctorPhone DateTime Result/Outcome Not

## 2014-05-07 ENCOUNTER — Other Ambulatory Visit: Payer: Self-pay

## 2014-05-07 ENCOUNTER — Telehealth: Payer: Self-pay | Admitting: Gastroenterology

## 2014-05-07 MED ORDER — VANCOMYCIN HCL 250 MG PO CAPS: ORAL_CAPSULE | ORAL | Status: DC

## 2014-05-07 NOTE — Telephone Encounter (Signed)
At the end of 9 days he should reduce vancomycin to 3 times a day, then twice a day after one week, then daily until he sees her.  Should he develop recurrent diarrhea at any time he should call us immediately.

## 2014-05-07 NOTE — Telephone Encounter (Signed)
Left message for patient to call back  

## 2014-05-07 NOTE — Telephone Encounter (Signed)
[name] patient can't see Dr. Baxter Flattery until March 21st.  He has 9 days left of vancomycin. He is concerned about being without the Vancomycin until the office visit please advise

## 2014-05-08 ENCOUNTER — Ambulatory Visit: Payer: 59 | Admitting: Infectious Diseases

## 2014-05-08 NOTE — Telephone Encounter (Signed)
Message left for the patient-spoke with the pharmacist and new Rx given which will be put on file. Patient given instructions to call the pharmacy 2-3 days before he needs the new rx.

## 2014-05-14 ENCOUNTER — Ambulatory Visit (INDEPENDENT_AMBULATORY_CARE_PROVIDER_SITE_OTHER): Payer: 59 | Admitting: Internal Medicine

## 2014-05-14 ENCOUNTER — Encounter: Payer: Self-pay | Admitting: Internal Medicine

## 2014-05-14 VITALS — BP 148/71 | HR 73 | Temp 98.8°F | Wt 177.0 lb

## 2014-05-14 DIAGNOSIS — A0471 Enterocolitis due to Clostridium difficile, recurrent: Secondary | ICD-10-CM

## 2014-05-14 DIAGNOSIS — A047 Enterocolitis due to Clostridium difficile: Secondary | ICD-10-CM

## 2014-05-14 NOTE — Progress Notes (Signed)
Subjective:    Patient ID: Joel Bauer, male    DOB: Aug 19, 1933, 81 y.o.   MRN: [id number]  HPI Joel Bauer is a pleasant 81 yo M who has had recurrent, refractory c.difficile infection for the last year. He is managed by [name] and referred to our clinic for evaluation of fecal transplant. Based upon his records, his first bout of c.difficile was in July 2014 treated with metrondazole. He has had several recurrences and confirmed testing. He was doing well in summer of 2015 when he had a period of being without symptoms for greater than 3 months, however he had to receive a course of cephalexin for ureteral stent removal when he was treated for nephrolithiasis in August 2015. He started to notice having right lower quadrant abdominal pain,no fever, no leukocytosis in late August, where his primary care treated him for unspecified colitis/diverticulitis with a course of cipro and metronidazole. He then was tested on Oct 1st, 2015, found to have recurrent cdifficile. He has been on 2 wk courses of vancomycin, vancomycin taper, as well as a course of fidoxamicin. He is presently finishing a 10d course of 250mg  QID, with planned taper. He notices that he feels that he relapses usually about a week after finishing a course of therapy, where he has numerous stools, with abdominal cramping, no blood in stool. He has not lost weight throughout this year. He is quite physically active.  Despite being on treatment, he is still having 4-5 bm per day, semi-formed. When he has cramping, feel poorly, watery stool, no blood in stool.   Previous positive cdifficile by PCR:  05/14/13 06/16/13 12/18/13 02/04/14 05/01/2014   No Known Allergies Current Outpatient Prescriptions on File Prior to Visit  Medication Sig Dispense Refill  . beta carotene w/minerals (OCUVITE) tablet Take 1 tablet by mouth daily.    Marland Kitchen ibuprofen (ADVIL,MOTRIN) 200 MG tablet Take 200-400 mg by mouth every 6 (six) hours as needed for  moderate pain.     Marland Kitchen losartan (COZAAR) 100 MG tablet Take 100 mg by mouth every evening.    . LUTEIN PO Take 1 capsule by mouth daily.    Marland Kitchen OVER THE COUNTER MEDICATION Take 1 tablet by mouth daily. pycnogenol Supplement    . saccharomyces boulardii (FLORASTOR) 250 MG capsule Take 250 mg by mouth every other day.    . vancomycin (VANCOCIN HCL) 250 MG capsule On day 10 begin 1 capsule 3 times daily for 7 days, then 1 capsule 2 times daily for 7 days, then 1 capsule daily 63 capsule 0  . VITAMIN D, CHOLECALCIFEROL, PO Take 1 capsule by mouth daily.    Marland Kitchen oxyCODONE-acetaminophen (PERCOCET/ROXICET) 5-325 MG per tablet Take 1 tablet by mouth. q 4-6 hrs as needed     No current facility-administered medications on file prior to visit.   Active Ambulatory Problems    Diagnosis Date Noted  . Glaucoma   . Abdominal pain, unspecified site 04/13/2013  . Hx of pseudomembranous enterocolitis 04/17/2013  . History of Clostridium difficile 12/17/2013  . History of diverticulitis 12/17/2013   Resolved Ambulatory Problems    Diagnosis Date Noted  . Diverticulitis 09/10/2012  . LLQ abdominal pain 09/16/2012  . Diarrhea 10/16/2012   Past Medical History  Diagnosis Date  . Adenomatous colon polyp 2001  . Diverticulosis of colon (without mention of hemorrhage) 2005  . Hypertension   . Hyperlipidemia   . Arthritis   . Shingles   . Pleurisy   . Prostatitis   .  Retinal hemorrhage   . Heart murmur   . History of skin cancer   . GERD (gastroesophageal reflux disease)   . Hx of Clostridium difficile infection   . History of kidney stones     family history includes Breast cancer in his mother; Colon cancer in his father; Colon polyps in his father; Heart disease in his mother; Kidney disease in his sister.  History  Substance Use Topics  . Smoking status: Never Smoker   . Smokeless tobacco: Never Used  . Alcohol Use: No   Review of Systems Review of Systems  Constitutional: Negative for fever,  chills, diaphoresis, activity change, appetite change, fatigue and unexpected weight change.  HENT: Negative for congestion, sore throat, rhinorrhea, sneezing, trouble swallowing and sinus pressure.  Eyes: Negative for photophobia and visual disturbance.  Respiratory: Negative for cough, chest tightness, shortness of breath, wheezing and stridor.  Cardiovascular: Negative for chest pain, palpitations and leg swelling.  Gastrointestinal: watery diarrhea and abdominal cramping Genitourinary: Negative for dysuria, hematuria, flank pain and difficulty urinating.  Musculoskeletal: Negative for myalgias, back pain, joint swelling, arthralgias and gait problem.  Skin: Negative for color change, pallor, rash and wound.  Neurological: Negative for dizziness, tremors, weakness and light-headedness.  Hematological: Negative for adenopathy. Does not bruise/bleed easily.  Psychiatric/Behavioral: Negative for behavioral problems, confusion, sleep disturbance, dysphoric mood, decreased concentration and agitation.       Objective:   Physical Exam BP 148/71 mmHg  Pulse 73  Temp(Src) 98.8 F (37.1 C) (Oral)  Wt 177 lb (80.287 kg) Physical Exam  Constitutional: He is oriented to person, place, and time. He appears well-developed and well-nourished. No distress.  HENT:  Mouth/Throat: Oropharynx is clear and moist. No oropharyngeal exudate.  Cardiovascular: Normal rate, regular rhythm and normal heart sounds. Exam reveals no gallop and no friction rub.  No murmur heard.  Pulmonary/Chest: Effort normal and breath sounds normal. No respiratory distress. He has no wheezes.  Abdominal: Soft. Bowel sounds are normal. He exhibits no distension. There is no tenderness.  Lymphadenopathy:  He has no cervical adenopathy.  Skin: Skin is warm and dry. No rash noted. No erythema.  Psychiatric: He has a normal mood and affect. His behavior is normal.       Assessment & Plan:  Recurrent c.difficile = given that  he has had > 3 relapses in the last year, he would likely be a good candidate for FMT. I have explained the procedure, how stool is procured from [facility]. I have given him literature. Also have mentioned that this is still considered experimental that he would need to sign consent. Cost of procuring stool will not likely be paid for by insurance thus he would be responsible for the charge of $535.   Continue on current taper of 250mg  TID x 7 days, then 250mg  BID x 7 days, then 250mg  daily.  Next step is to coordinate with Dr. Carlean Purl to determine when we could do the fecal transplant  I have spent 30 min with patient , 100% in face to face time in discussing treatment options for recurrent c.difficile.

## 2014-05-15 ENCOUNTER — Telehealth: Payer: Self-pay | Admitting: *Deleted

## 2014-05-15 NOTE — Telephone Encounter (Signed)
Faxed approval for Vanco to patients pharmacy

## 2014-05-18 ENCOUNTER — Telehealth: Payer: Self-pay | Admitting: Gastroenterology

## 2014-05-18 NOTE — Telephone Encounter (Signed)
The patient is concerned he will run out of Vancomycin before the transplant. Is it okay to given refills as needed? I have asked him to call a week in advance.

## 2014-05-18 NOTE — Telephone Encounter (Signed)
Ok to refill 

## 2014-06-01 ENCOUNTER — Other Ambulatory Visit: Payer: Self-pay

## 2014-06-01 ENCOUNTER — Telehealth: Payer: Self-pay | Admitting: Gastroenterology

## 2014-06-01 MED ORDER — VANCOMYCIN HCL 250 MG PO CAPS: 250.0000 mg | ORAL_CAPSULE | Freq: Two times a day (BID) | ORAL | Status: DC

## 2014-06-01 NOTE — Telephone Encounter (Signed)
The patient has continued on Vancomycin 250mg  BID. He did not decrease to daily. He doesn't feel well today, but it has only been a day. He will keep me posted. My question is on the refill of the Vancomycin. Will he remain on this up to the date of the fecal transplant? He doesn't have a date yet. He has seen ID. He is waiting to see Carlean Purl. Please advise.

## 2014-06-01 NOTE — Telephone Encounter (Signed)
I have given a new Vanc prescription for this man. It will be BID dosing. It may need another PA. He is having some abdominal discomfort again. He has enough medication left for 5 days.

## 2014-06-01 NOTE — Telephone Encounter (Signed)
It may not. I have not received a request for another new  prior auth

## 2014-06-01 NOTE — Telephone Encounter (Signed)
He can continue at this dose until the fecal transplantation

## 2014-06-04 NOTE — Telephone Encounter (Signed)
THIS MEDICATION IS APPROVED UNTIL April THE 20TH. AS STATED BY [employer] AUTHORIZATION  CALLED [facility] AND [facility]    PT IS STILL APPROVED UNTIL April THE 20TH 2016. CAN NOT PICK UP NEW SCRIPT UNTIL 3/20. HE IS TRYING TO REFILL TO SOON. NEW SCRIPT CANT BE FILLED UNTIL MARCH 20TH

## 2014-06-08 ENCOUNTER — Ambulatory Visit (INDEPENDENT_AMBULATORY_CARE_PROVIDER_SITE_OTHER): Payer: Medicare Other | Admitting: Internal Medicine

## 2014-06-08 ENCOUNTER — Ambulatory Visit: Payer: 59 | Admitting: Internal Medicine

## 2014-06-08 ENCOUNTER — Encounter: Payer: Self-pay | Admitting: Internal Medicine

## 2014-06-08 VITALS — BP 120/64 | HR 80 | Ht 71.0 in | Wt 175.0 lb

## 2014-06-08 DIAGNOSIS — A047 Enterocolitis due to Clostridium difficile: Secondary | ICD-10-CM

## 2014-06-08 DIAGNOSIS — A0471 Enterocolitis due to Clostridium difficile, recurrent: Secondary | ICD-10-CM

## 2014-06-08 NOTE — Progress Notes (Signed)
Subjective:    Patient ID: Joel Bauer, male    DOB: Feb 20, 1934, 81 y.o.   MRN: [id number] Chief complaint: Recurrent Clostridium difficile colitis HPI The patient is a pleasant elderly white man with recurrent Clostridium difficile colitis. Originally diagnosed in the summer of 2014 and is recurrent throughout 2015. In 2014 history of metronidazole. Then in 2015 he has had a recurrence related to a course of cephalexin for ureteral stent removal, and then he also had treatment for diverticulitis with Cipro and metronidazole. October 2015 he had recurrent C. difficile he said 2 week courses of that vancomycin taper and find DOC some mycin but has had persistent recurrent problems. He is currently on vancomycin to going to one a day without diarrhea or problems since seeing Dr. Baxter Flattery of infectious disease last month. She has described and recommended fecal microbiologic a transplant to treat his recurrent C. difficile and the patient is here today to discuss that further in anticipation of the transplant.  No Known Allergies Outpatient Prescriptions Prior to Visit  Medication Sig Dispense Refill  . beta carotene w/minerals (OCUVITE) tablet Take 1 tablet by mouth daily.    Marland Kitchen ibuprofen (ADVIL,MOTRIN) 200 MG tablet Take 200-400 mg by mouth every 6 (six) hours as needed for moderate pain.     Marland Kitchen losartan (COZAAR) 100 MG tablet Take 100 mg by mouth every evening.    . LUTEIN PO Take 1 capsule by mouth daily.    Marland Kitchen oxyCODONE-acetaminophen (PERCOCET/ROXICET) 5-325 MG per tablet Take 1 tablet by mouth. q 4-6 hrs as needed    . saccharomyces boulardii (FLORASTOR) 250 MG capsule Take 250 mg by mouth every other day.    Marland Kitchen VITAMIN D, CHOLECALCIFEROL, PO Take 1 capsule by mouth daily.    Marland Kitchen OVER THE COUNTER MEDICATION Take 1 tablet by mouth daily. pycnogenol Supplement    . vancomycin (VANCOCIN HCL) 250 MG capsule Take 1 capsule (250 mg total) by mouth 2 (two) times daily. 60 capsule 0   No  facility-administered medications prior to visit.   Past Medical History  Diagnosis Date  . Adenomatous colon polyp 2001  . Diverticulosis of colon (without mention of hemorrhage) 2005  . Hypertension   . Hyperlipidemia   . Arthritis   . Shingles   . Pleurisy   . Prostatitis   . Retinal hemorrhage   . Heart murmur   . History of skin cancer   . GERD (gastroesophageal reflux disease)   . Hx of Clostridium difficile infection     3 MONTHS AGO  . History of kidney stones    Past Surgical History  Procedure Laterality Date  . Lithotripsy    . Tonsillectomy    . Cataracts removed    . Cystoscopy with retrograde pyelogram, ureteroscopy and stent placement Right 10/18/2013    Procedure: CYSTOSCOPY WITH RIGHT RETROGRADE/RIGHT URETEROSCOPY, STONE BASKETRY,  AND STENT PLACEMENT RIGHT;  Surgeon: Alexis Frock, MD;  Location: [facility];  Service: Urology;  Laterality: Right;  . Colonoscopy     History   Social History  . Marital Status: Married    Spouse Name: N/A  . Number of Children: 1  . Years of Education: N/A   Occupational History  . Retired     Social History Main Topics  . Smoking status: Never Smoker   . Smokeless tobacco: Never Used  . Alcohol Use: No  . Drug Use: No  . Sexual Activity: Not on file   Other Topics Concern  . None  Social History Narrative   Family History  Problem Relation Age of Onset  . Colon cancer Father   . Colon polyps Father   . Breast cancer Mother   . Heart disease Mother   . Kidney disease Sister     Review of Systems     Objective:   Physical Exam BP 120/64 mmHg  Pulse 80  Ht 5\' 11"  (1.803 m)  Wt 175 lb (79.379 kg)  BMI 24.42 kg/m2 WDWN NAD  Data reviewed includes labs, prior GI notes, infectious disease notes, from 29 and 2016.     Assessment & Plan:   1. Recurrent colitis due to Clostridium difficile    He appears to be an appropriate candidate for fecal Kyen biotic a transplant to treat his recurrent C.  difficile colitis. Hopefully he will be able to take antibiotics without problems in the future once this is accomplished. I've explained the risks benefits and indications and rationale for the treatment. He's been given a copy of the institutional review board informed consent for fecal microbiology transplant. I have explained the risks benefits and indications of colonoscopy and how this colonoscopy is mainly therapeutic and not intended to be a screening procedure. We plan for fecal microbiologic transplant in 1 week, using the open biome stool. He is aware of the cost of that medication.  I appreciate the opportunity to care for this patient. CC: Odette Fraction, MD Erskine Emery M.D. Carlyle Basques M.D.

## 2014-06-08 NOTE — Patient Instructions (Addendum)
Please read the consent form for the fecal transplant I have given you and also look at the information printed below. Please refill the vancomycin and take your last dose on Saturday 06/13/14 - 2 days before the transplant.  I appreciate the opportunity to care for you. Gatha Mayer, MD, [facility]    Understanding Clostridium Difficile Infection and Its Treatments    Each year, Clostridium difficile bacterium infects roughly 500,000 people in the [place] sending more than 347,000 to the hospital for treatment.   Clostridium difficile, or C. difficile for short, can be a very aggressive intestinal bug. Each year, this rod-shaped bacterium infects roughly 500,000 people in the [place] sending more than 347,000 to the hospital for treatment. In extreme cases, C. difficile infection can be fatal, with estimates of C. diff-associated death ranging from 14,000 to 30,000 annually. C. difficile infection costs the [place] $1 billion each year.   C. difficile Infection  C. difficile can be found in the soil, air, water, and human and animal feces. Perhaps as many as 10 percent of the population carry the bacteria with no ill effects or symptoms. However, these individuals shed the bacteria through their feces. When people who carry C. difficile do not wash their hands after going to the bathroom, they can contaminate the food they handle and they can leave the bacteria on things they touch. The bacteria then produce spores (a dormant form of the bacteria) that can survive for months unless the area is thoroughly cleaned with products specifically designed to kill them, such as cleansers that contain bleach. If you touch a surface contaminated with C. difficile, you may accidentally ingest the spores when you touch your food or your mouth.  C. difficile is most common in hospitals and long-term care facilities, like nursing homes. Studies suggest that up to 20 percent of hospital patients and 50 percent  of people in long-term care facilities carry C. difficile, even if they do not have any symptoms. Unless a hospital or facility follows special procedures for cleaning and disinfection, the spores can remain on beds, toilets and medical equipment. If health-care providers and other facility staff do not wash their hands properly, they can transmit the bacteria from patient to patient. As many hospital patients and long-term care residents are already in a weakened condition because of illness, injury or age, C. difficile can spread throughout these facilities very quickly.  If you are healthy, you will most likely not develop an infection with C. difficile. Other organisms normally present in your gastrointestinal (GI) tract keep it in check by occupying the sites where C. difficile could attach and multiply. Think of these sites as parking spaces - if another organism is already there, C. difficile has nowhere to park.  However, taking antibiotics can wipe out these helpful organisms. With fewer organisms to occupy sites on the intestinal lining, C. difficile can establish itself in the intestine and grow. It then produces toxins that destroy cells and cause inflammation, resulting in symptoms, primarily diarrhea.  C. difficile is responsible for between 15 and 25 percent of cases of antibiotic-related diarrhea. Although other bacteria and organisms can colonize the intestine and make you sick because of antibiotics, C. difficile is often more stubborn and more likely to come back.   C. difficile Infection Symptoms  The symptoms of C. difficile infection usually begin with the following:  Watery diarrhea three or more times a day for more than two days. Abdominal pain caused by intestinal  cramps. Mild to moderate nausea. Loss of appetite. In more serious cases, the degree of inflammation in the colon may be more extensive and severe causing bleeding, the formation of pus and destruction of the  lining of the intestine. In this case, symptoms may include:  Watery diarrhea as often as 15 times per day. Severe abdominal pain and intestinal cramping. Dehydration. Fever. Weight loss. Severe C. difficile can be dangerous. Rapid dehydration may affect your organs and cause your blood pressure to be dangerously low. If you get dehydrated extremely fast, it can damage your kidneys and lead to kidney failure.  In the most severe cases, the lining of the large intestine may develop holes (perforation). It could also become extremely stretched and swollen and may burst. When bacteria or feces from your intestines escape into other areas of the body, either through bowel perforation or a ruptured colon, it is a medical emergency that requires immediate surgery, because it can be fatal.    Risk Factors  The main cause of C. difficile infections is antibiotic use, especially if you have been taking antibiotics for a long time or take a "broad spectrum" antibiotic that kills a wide variety of bacteria. Because C. difficile is more common in hospitals, nursing homes and other care facilities, being a patient or resident of these facilities is also a risk.  Other risk factors include:  Being at least 79 years old. Having abdominal surgery. Existing problems or disease in your intestines, such as inflammatory bowel disease or colon cancer. Having a weakened immune system because of chemotherapy or other drugs that suppress the immune system, or AIDS. Previous infection with C. difficile, especially recent.     Diagnosis and Testing If you develop diarrhea within a few days of being admitted to or released from a hospital, or within two months of taking an antibiotic, and you have had three or more unformed stools or bouts of diarrhea in 24 hours, C. difficile may be the culprit.  There are several ways to test for C. difficile. Which one your doctor chooses will depend on your symptoms and  medical history and whether you are currently in a hospital.  Stool tests. The simplest test is a stool test, where you provide a stool sample in a sterile container provided by your doctor's office or lab. The lab will run tests on the sample to detect toxins produced by the bacteria. Stool tests can take 24 to 48 hours to provide results. Blood tests. Blood tests can reveal a high white blood cell count, which is a sign of infection in the body.  Endoscopic examination. In a very few cases, an examination of your colon by colonoscopy or sigmoidoscopy may be needed. A gastroenterologist (a doctor who specializes in treating the digestive system) should be the one to decide if this test is necessary. A colonoscopy allows the doctor to examine the entire colon and rectum while a sigmoidoscopy only examines the lower part of the colon and the rectum.   A colonoscopy can reveal areas of inflammation that indicate infection with C. difficile. It will also enable the doctor to take tissue samples if necessary. If you are in the hospital and need to have surgery, stool tests may take too long to provide results, and your doctor may opt for a colonoscopy in order to save time. CT Scan. If your doctor suspects you may be developing complications from C. difficile, he or she may order a computerized tomography (CT) scan to  look for thickening of the wall of your intestines. During this test you may receive an intravenous injection of a special dye which will enable the CT machine to take better pictures of your intestines. If you have an allergy to iodine or shellfish, tell your doctor, because people with these allergies may have a reaction to the dye.   Treatment If you develop a C. difficile infection while taking an antibiotic, your doctor may advise you to stop taking it or switch to another type. Depending on how long you've been coping with diarrhea or how sick you are, your doctor may want you to receive  IV fluids and electrolytes so you do not get dehydrated. Unfortunately, C. difficile can be stubborn, so treatment often requires several steps.  Antibiotics. Stopping the antibiotics that allow C. difficile to grow and receiving fluids resolves the infection in about 20 percent of people who are infected with the bacteria. However, if that is not successful, your doctor may prescribe a different type of antibiotic to specifically treat the C. difficile infection. For a mild or moderate infection, the first choice is usually metronidazole for 10 to 14 days. For a severe infection, the first choice is usually vancomycin for 10 to 14 days. If those don't work, your doctor may try fidaxomicin or rifaximin.  Many people who take antibiotics to treat C. difficile infection start to feel better within three days, but it is important to continue taking the medication until you finish it. Otherwise, it may not kill all of the C. difficile organisms and you may suffer a relapse. Approximately 25 percent of people with C. difficile will need a second round of antibiotics.  Probiotics. Probiotics are living microscopic organisms (microorganisms), that research has shown to benefit your health. Most often they are bacteria, but they may also be other organisms such as yeasts. In some cases they are similar to, or the same as, the helpful organisms in your GI tract.  There is evidence that taking certain probiotics may help treat or prevent C. difficile infection. Saccharomyces boulardii has been shown to decrease the frequency of relapses in people who have had several bouts of the disease, especially if it is taken along with vancomycin. There is also evidence to suggest that Lactobacillus plantarum 299v helps prevent relapses when taken with metronidazole. In general, probiotics are thought to work against C. difficile by competing for the space it occupies in the GI tract.   Probiotics are widely available over the  counter in pharmacies and health food stores. However, there are many different types of probiotics, and not all of them have been studied for treating C. difficile infection or have been shown to be helpful. Also, there have been cases of serious illness and death in people who were critically ill or who had a weakened immune system who took probiotics. Therefore, if you have C. difficile, it is extremely important for you to ask your doctor about probiotics before you take them.  Surgery. In extremely severe, life-threatening cases, it may be necessary to remove the infected part of the GI tract.  Fecal microbiota transplantation (FMT). In this procedure, stool from a healthy donor is pro-cessed into a liquid preparation and transferred into the colon of the infected individual in an effort to reintroduce or boost helpful organisms.  Back to Top  FMT: What's Old Is What's New  In 1958, Mindi Curling, MD, a surgeon in [place], [place], first tried FMT when four of his patients had  colitis he suspected of being caused by C. difficile. All four patients recovered. For several decades thereafter, FMT remained relatively obscure. But at the turn of this century, a new and particularly tough strain of C. difficile emerged, and infections began to climb steadily. Faced with treating more frequent and more stubborn cases of infection, doctors have since revisited FMT as a treatment option. In 2011, the [place] ([employer]) published an article that provides gastroenterologists with information on determining who might benefit from [place], screening donors and performing the procedures.    Who is a Candidate for FMT?  FMT may be an option for people who have had one of the following:  At least three episodes of mild to moderate C. difficile infection that have not responded to six to eight weeks of treatment with antibiotics.  Have had at least two episodes of severe C. difficile  infection that required them to be admitted to the hospital.  Moderate C. difficile infection that did not respond to antibiotics (namely vancomycin) for at least a week. Severe C. difficile infection or severe colitis caused by C. difficile that did not respond to antibiotics within two days. Not everyone is a good candidate for FMT. The procedure is risky for people who are taking drugs that suppress the immune system, have had a recent bone marrow transplant, or have cirrhosis of the liver or advanced HIV or AIDS. If you fall into one of these categories, your doctor may advise against it, depending on how severe your C. difficile infection is and whether you have other complications.  Donors Donors who provide the stools for transplantation must be screened carefully to avoid transmitting dangerous viruses or bacteria into the person infected with C. difficile. Doctors first screen potential donors by asking them the same questions they would ask of potential blood donors. Donors will also be tested for diseases that can be spread through the blood, such as hepatitis, HIV and syphilis. Donors can be family or friends or there is a donor stool bank called [employer] that provides stool that is tested and screened for a fee.  People who have taken antibiotics within the last three months or who have diseases or conditions of the GI tract, such as irritable bowel syndrome or chronic constipation, should not be donors. Other chronic conditions could possibly be transmitted by FMT; therefore, a physician's advice is needed for the selection of a proper donor. The donor's stool will be tested as well, especially for bacteria that cause infectious diarrhea. (Keep in mind that these tests may not be covered by health insurance and can cost up to $500. Donors should check with their insurance companies and plan accordingly.)  Because the long-term effects of FMT are not known, the Food and Drug Administration  requires that an informed consent be signed by the recipient. It is possible that a registry of patients who have had FMT may be established to enable them to be followed over time. If and when such a registry is developed, patients are strongly encouraged to be a part of it.    Preparing for the Transplant  Although the actual procedures for FMT will depend on the health of the recipient and the preferences of the doctor performing the transplant, there are several things recipients and donors will have to do to prepare. Doctors use different techniques to perform FMT, including colonoscopy, enema or infusion through a nasogastric tube (a tube the runs from your nose down into your stomach).  Each of these procedures has some risk. For colonoscopy and enema, there is a very low risk of bowel perforation. For transfer via a nasogastric tube, there is a small chance that some of the fluid containing the stool could end up in the lungs and cause an infection there.  There have been some cases of successful "do-it-yourself" stool transplants involving enemas at home. Because of the risk of contamination by improper handling of the stool, and because the donor's GI tract may contain bacteria and organisms harmful to the recipient, these transplants must be overseen by a doctor. In addition, both the donor and the recipient must be screened as though the procedure was going to take place in a hospital.  Recipients Ideally, people who are going to receive FMT should have a completely empty GI tract. Usually, this means drinking only clear fluids and not eating anything for 24 hours before the procedure. People with mild or moderate C. difficile may also be asked to drink a liquid that will make them defecate until their entire digestive system is empty. However, those with severe infection, colitis, pseudomembranous colitis or toxic megacolon should not drink these preparations.  Recipients who will receive  the transplant by enema or colonoscopy may be given loperamide (Imodium) the day of the procedure. This drug slows down the muscle contractions in the intestines and colon so that the donor stool stays in the body longer and the helpful organisms in the stool have a chance to take hold.  If the transplant is to be delivered by a nasogastric tube, the recipient will be given a drug to prevent the stomach from secreting acid that can potentially destroy the helpful organisms in the donor stool.   Donors Donors should avoid any foods that the recipient is allergic to for five days before the transplant. This will give the donor's body time to clear the food from the GI tract. Donors who experience any sign of infection, such as fever, diarrhea or vomiting, between the time they are screened and the time of their donation should contact the doctor who will be performing the transplant right away.  Your doctor may tell you to take a certain type of laxative that increases the amount of water in the bowel the night before the procedure. (Some of these laxatives require a prescription.) It will make passing a stool easier in the morning.  FMT is considered a success if the person with C. difficile has no relapses for eight weeks. Research indicates that [place] has a high success rate, although some people with particularly stubborn C. difficile may have to have more than one transplant.   You have been scheduled for a colonoscopy. Please follow written instructions given to you at your visit today.  Please pick up your prep supplies at the pharmacy within the next 1-3 days. If you use inhalers (even only as needed), please bring them with you on the day of your procedure.  AT 11:00AM ON MARCH 28TH TAKE 2 IMMODIUM WITH A SMALL AMOUNT OF WATER.   I appreciate the opportunity to care for you. Silvano Rusk, M.D., [facility]

## 2014-06-15 ENCOUNTER — Ambulatory Visit (HOSPITAL_COMMUNITY)
Admission: RE | Admit: 2014-06-15 | Discharge: 2014-06-15 | Disposition: A | Discharge status: Home / Self Care | Payer: Medicare Other | Source: Ambulatory Visit | Attending: Internal Medicine | Admitting: Internal Medicine

## 2014-06-15 ENCOUNTER — Ambulatory Visit (HOSPITAL_COMMUNITY): Payer: Medicare Other | Admitting: Anesthesiology

## 2014-06-15 ENCOUNTER — Encounter (HOSPITAL_COMMUNITY)
Admission: RE | Disposition: A | Discharge status: Home / Self Care | Payer: Self-pay | Source: Ambulatory Visit | Attending: Internal Medicine

## 2014-06-15 ENCOUNTER — Encounter (HOSPITAL_COMMUNITY): Payer: Self-pay | Admitting: *Deleted

## 2014-06-15 DIAGNOSIS — K5730 Diverticulosis of large intestine without perforation or abscess without bleeding: Secondary | ICD-10-CM | POA: Diagnosis not present

## 2014-06-15 DIAGNOSIS — A0471 Enterocolitis due to Clostridium difficile, recurrent: Secondary | ICD-10-CM | POA: Insufficient documentation

## 2014-06-15 DIAGNOSIS — Z1211 Encounter for screening for malignant neoplasm of colon: Secondary | ICD-10-CM | POA: Diagnosis not present

## 2014-06-15 DIAGNOSIS — A047 Enterocolitis due to Clostridium difficile: Secondary | ICD-10-CM | POA: Diagnosis not present

## 2014-06-15 DIAGNOSIS — K219 Gastro-esophageal reflux disease without esophagitis: Secondary | ICD-10-CM | POA: Diagnosis not present

## 2014-06-15 DIAGNOSIS — Z86010 Personal history of colonic polyps: Secondary | ICD-10-CM | POA: Diagnosis not present

## 2014-06-15 DIAGNOSIS — Z87442 Personal history of urinary calculi: Secondary | ICD-10-CM | POA: Insufficient documentation

## 2014-06-15 DIAGNOSIS — M1990 Unspecified osteoarthritis, unspecified site: Secondary | ICD-10-CM | POA: Insufficient documentation

## 2014-06-15 DIAGNOSIS — E785 Hyperlipidemia, unspecified: Secondary | ICD-10-CM | POA: Diagnosis not present

## 2014-06-15 DIAGNOSIS — Z85828 Personal history of other malignant neoplasm of skin: Secondary | ICD-10-CM | POA: Insufficient documentation

## 2014-06-15 DIAGNOSIS — I10 Essential (primary) hypertension: Secondary | ICD-10-CM | POA: Insufficient documentation

## 2014-06-15 HISTORY — PX: COLONOSCOPY: SHX5424

## 2014-06-15 SURGERY — COLONOSCOPY
Anesthesia: Monitor Anesthesia Care

## 2014-06-15 MED ORDER — PROPOFOL 10 MG/ML IV BOLUS
INTRAVENOUS | Status: DC | PRN
  Administered 2014-06-15 (×5): 20 mg via INTRAVENOUS

## 2014-06-15 MED ORDER — LACTATED RINGERS IV SOLN
INTRAVENOUS | Status: DC
  Administered 2014-06-15: 1000 mL via INTRAVENOUS

## 2014-06-15 MED ORDER — LACTATED RINGERS IV SOLN
INTRAVENOUS | Status: DC | PRN
  Administered 2014-06-15: 14:00:00 via INTRAVENOUS

## 2014-06-15 MED ORDER — FENTANYL CITRATE 0.05 MG/ML IJ SOLN
INTRAMUSCULAR | Status: DC | PRN
  Administered 2014-06-15: 50 ug via INTRAVENOUS
  Administered 2014-06-15: 100 ug via INTRAVENOUS

## 2014-06-15 MED ORDER — MIDAZOLAM HCL 5 MG/5ML IJ SOLN
INTRAMUSCULAR | Status: DC | PRN
  Administered 2014-06-15: 1 mg via INTRAVENOUS

## 2014-06-15 MED ORDER — SODIUM CHLORIDE 0.9 % IV SOLN: INTRAVENOUS | Status: DC

## 2014-06-15 SURGICAL SUPPLY — 1 items: PREP MICROBIOTA FECAL (Tissue) ×1 IMPLANT

## 2014-06-15 NOTE — Anesthesia Preprocedure Evaluation (Signed)
Anesthesia Evaluation  Patient identified by MRN, date of birth, ID band Patient awake    Reviewed: Allergy & Precautions, NPO status , Patient's Chart, lab work & pertinent test results  Airway Mallampati: II  TM Distance: >3 FB Neck ROM: Full    Dental no notable dental hx.    Pulmonary neg pulmonary ROS,  breath sounds clear to auscultation  Pulmonary exam normal       Cardiovascular hypertension, Pt. on medications Rhythm:Regular Rate:Normal     Neuro/Psych negative neurological ROS  negative psych ROS   GI/Hepatic Neg liver ROS, GERD-  ,  Endo/Other  negative endocrine ROS  Renal/GU negative Renal ROS  negative genitourinary   Musculoskeletal negative musculoskeletal ROS (+)   Abdominal   Peds negative pediatric ROS (+)  Hematology negative hematology ROS (+)   Anesthesia Other Findings   Reproductive/Obstetrics negative OB ROS                             Anesthesia Physical Anesthesia Plan  ASA: II  Anesthesia Plan: MAC   Post-op Pain Management:    Induction: Intravenous  Airway Management Planned: Simple Face Mask and Nasal Cannula  Additional Equipment:   Intra-op Plan:   Post-operative Plan:   Informed Consent: I have reviewed the patients History and Physical, chart, labs and discussed the procedure including the risks, benefits and alternatives for the proposed anesthesia with the patient or authorized representative who has indicated his/her understanding and acceptance.     Plan Discussed with: CRNA and Surgeon  Anesthesia Plan Comments:         Anesthesia Quick Evaluation

## 2014-06-15 NOTE — Transfer of Care (Signed)
Immediate Anesthesia Transfer of Care Note  Patient: Joel Bauer  Procedure(s) Performed: Procedure(s): COLONOSCOPY (N/A)  Patient Location: PACU  Anesthesia Type:MAC  Level of Consciousness: awake, alert , oriented and patient cooperative  Airway & Oxygen Therapy: Patient Spontanous Breathing and Patient connected to nasal cannula oxygen  Post-op Assessment: Report given to RN, Post -op Vital signs reviewed and stable and Patient moving all extremities  Post vital signs: Reviewed and stable  Last Vitals:  Filed Vitals:   06/15/14 1424  BP: 114/52  Pulse: 64  Temp:   Resp: 13    Complications: No apparent anesthesia complications

## 2014-06-15 NOTE — Interval H&P Note (Signed)
History and Physical Interval Note:  06/15/2014 1:43 PM  Joel Bauer  has presented today for surgery, with the diagnosis of c-diff  The various methods of treatment have been discussed with the patient and family. After consideration of risks, benefits and other options for treatment, the patient has consented to  Procedure(s): COLONOSCOPY (N/A) as a surgical intervention .  The patient's history has been reviewed, patient examined, no change in status, stable for surgery.  I have reviewed the patient's chart and labs.  Questions were answered to the patient's satisfaction.     Silvano Rusk

## 2014-06-15 NOTE — H&P (View-Only) (Signed)
Subjective:    Patient ID: Joel Bauer, male    DOB: [birth year], 81 y.o.   MRN: [id number] Chief complaint: Recurrent Clostridium difficile colitis HPI The patient is a pleasant elderly white man with recurrent Clostridium difficile colitis. Originally diagnosed in the summer of 2014 and is recurrent throughout 2015. In 2014 history of metronidazole. Then in 2015 he has had a recurrence related to a course of cephalexin for ureteral stent removal, and then he also had treatment for diverticulitis with Cipro and metronidazole. October 2015 he had recurrent C. difficile he said 2 week courses of that vancomycin taper and find DOC some mycin but has had persistent recurrent problems. He is currently on vancomycin to going to one a day without diarrhea or problems since seeing Dr. Baxter Flattery of infectious disease last month. She has described and recommended fecal microbiologic a transplant to treat his recurrent C. difficile and the patient is here today to discuss that further in anticipation of the transplant.  No Known Allergies Outpatient Prescriptions Prior to Visit  Medication Sig Dispense Refill  . beta carotene w/minerals (OCUVITE) tablet Take 1 tablet by mouth daily.    Marland Kitchen ibuprofen (ADVIL,MOTRIN) 200 MG tablet Take 200-400 mg by mouth every 6 (six) hours as needed for moderate pain.     Marland Kitchen losartan (COZAAR) 100 MG tablet Take 100 mg by mouth every evening.    . LUTEIN PO Take 1 capsule by mouth daily.    Marland Kitchen oxyCODONE-acetaminophen (PERCOCET/ROXICET) 5-325 MG per tablet Take 1 tablet by mouth. q 4-6 hrs as needed    . saccharomyces boulardii (FLORASTOR) 250 MG capsule Take 250 mg by mouth every other day.    Marland Kitchen VITAMIN D, CHOLECALCIFEROL, PO Take 1 capsule by mouth daily.    Marland Kitchen OVER THE COUNTER MEDICATION Take 1 tablet by mouth daily. pycnogenol Supplement    . vancomycin (VANCOCIN HCL) 250 MG capsule Take 1 capsule (250 mg total) by mouth 2 (two) times daily. 60 capsule 0   No  facility-administered medications prior to visit.   Past Medical History  Diagnosis Date  . Adenomatous colon polyp 2001  . Diverticulosis of colon (without mention of hemorrhage) 2005  . Hypertension   . Hyperlipidemia   . Arthritis   . Shingles   . Pleurisy   . Prostatitis   . Retinal hemorrhage   . Heart murmur   . History of skin cancer   . GERD (gastroesophageal reflux disease)   . Hx of Clostridium difficile infection     3 MONTHS AGO  . History of kidney stones    Past Surgical History  Procedure Laterality Date  . Lithotripsy    . Tonsillectomy    . Cataracts removed    . Cystoscopy with retrograde pyelogram, ureteroscopy and stent placement Right 10/18/2013    Procedure: CYSTOSCOPY WITH RIGHT RETROGRADE/RIGHT URETEROSCOPY, STONE BASKETRY,  AND STENT PLACEMENT RIGHT;  Surgeon: Alexis Frock, MD;  Location: [facility];  Service: Urology;  Laterality: Right;  . Colonoscopy     History   Social History  . Marital Status: Married    Spouse Name: N/A  . Number of Children: 1  . Years of Education: N/A   Occupational History  . Retired     Social History Main Topics  . Smoking status: Never Smoker   . Smokeless tobacco: Never Used  . Alcohol Use: No  . Drug Use: No  . Sexual Activity: Not on file   Other Topics Concern  . None  Social History Narrative   Family History  Problem Relation Age of Onset  . Colon cancer Father   . Colon polyps Father   . Breast cancer Mother   . Heart disease Mother   . Kidney disease Sister     Review of Systems     Objective:   Physical Exam BP 120/64 mmHg  Pulse 80  Ht 5\' 11"  (1.803 m)  Wt 175 lb (79.379 kg)  BMI 24.42 kg/m2 WDWN NAD  Data reviewed includes labs, prior GI notes, infectious disease notes, from 84 and 2016.     Assessment & Plan:   1. Recurrent colitis due to Clostridium difficile    He appears to be an appropriate candidate for fecal Elis biotic a transplant to treat his recurrent C.  difficile colitis. Hopefully he will be able to take antibiotics without problems in the future once this is accomplished. I've explained the risks benefits and indications and rationale for the treatment. He's been given a copy of the institutional review board informed consent for fecal microbiology transplant. I have explained the risks benefits and indications of colonoscopy and how this colonoscopy is mainly therapeutic and not intended to be a screening procedure. We plan for fecal microbiologic transplant in 1 week, using the open biome stool. He is aware of the cost of that medication.  I appreciate the opportunity to care for this patient. CC: Odette Fraction, MD Erskine Emery M.D. Carlyle Basques M.D.

## 2014-06-15 NOTE — Op Note (Signed)
[facility] [place], [address]   COLONOSCOPY PROCEDURE REPORT  PATIENT: Joel Bauer, Joel Bauer  MR#: [id number] BIRTHDATE: 11-12-1933 , 80  yrs. old GENDER: male ENDOSCOPIST: Gatha Mayer, MD, [facility] PROCEDURE DATE:  06/15/2014 PROCEDURE:   Colonoscopy, diagnostic First Screening Colonoscopy - Avg.  risk and is 50 yrs.  old or older - No.  Prior Negative Screening - Now for repeat screening. N/A  History of Adenoma - Now for follow-up colonoscopy & has been > or = to 3 yrs.  N/A ASA CLASS:   Class II INDICATIONS: MEDICATIONS: Monitored anesthesia care and Per Anesthesia  DESCRIPTION OF PROCEDURE:   After the risks benefits and alternatives of the procedure were thoroughly explained, informed consent was obtained.  The digital rectal exam revealed no abnormalities of the rectum.   The     endoscope was introduced through the anus and advanced to the terminal ileum which was intubated for a short distance. No adverse events experienced. The quality of the prep was adequate (MiraLax was used)  The instrument was then quickly withdrawn after fecal transplant infusion..      COLON FINDINGS: 1) Left-side diverticulosis seen. 2) Otherwise normal limited exam 3) 250 cc of Open Biome fecal transplant liquid infused into terminal ileum.  Retroflexed views revealed no abnormalities. The time to cecum = 3.1 Withdrawal time = 3.7   The scope was withdrawn and the procedure completed. COMPLICATIONS: There were no immediate complications.  ENDOSCOPIC IMPRESSION: 1) Left-side diverticulosis seen. 2) Otherwise normal limited exam 3) 250 cc of Open Biome fecal transplant liquid infused into terminal ileum  RECOMMENDATIONS: Await response to FMT Stop Florastor see ID as planned and GI prn eSigned:  Gatha Mayer, MD, [facility] 06/15/2014 2:38 PM   cc: Erskine Emery, MD and Margaretmary Eddy, MD

## 2014-06-15 NOTE — Discharge Instructions (Signed)
° °  Things went as expected with the fecal transplant. Please take 2 more Imodium AD when you get home. Stop the Florastor.  I appreciate the opportunity to care for you. Gatha Mayer, MD, FACG  YOU HAD AN ENDOSCOPIC PROCEDURE TODAY: Refer to the procedure report and other information in the discharge instructions given to you for any specific questions about what was found during the examination. If this information does not answer your questions, please call Dr. Celesta Aver office at [phone] to clarify.   YOU SHOULD EXPECT: Some feelings of bloating in the abdomen. Passage of more gas than usual. Walking can help get rid of the air that was put into your GI tract during the procedure and reduce the bloating. If you had a lower endoscopy (such as a colonoscopy or flexible sigmoidoscopy) you may notice spotting of blood in your stool or on the toilet paper. Some abdominal soreness may be present for a day or two, also.  DIET: Your first meal following the procedure should be a light meal and then it is ok to progress to your normal diet. A half-sandwich or bowl of soup is an example of a good first meal. Heavy or fried foods are harder to digest and may make you feel nauseous or bloated. Drink plenty of fluids but you should avoid alcoholic beverages for 24 hours.   ACTIVITY: Your care partner should take you home directly after the procedure. You should plan to take it easy, moving slowly for the rest of the day. You can resume normal activity the day after the procedure however YOU SHOULD NOT DRIVE, use power tools, machinery or perform tasks that involve climbing or major physical exertion for 24 hours (because of the sedation medicines used during the test).   SYMPTOMS TO REPORT IMMEDIATELY: A gastroenterologist can be reached at any hour. Please call [phone]  for any of the following symptoms:  Following lower endoscopy (colonoscopy, flexible sigmoidoscopy) Excessive amounts of  blood in the stool  Significant tenderness, worsening of abdominal pains  Swelling of the abdomen that is new, acute  Fever of 100 or higher

## 2014-06-15 NOTE — Anesthesia Postprocedure Evaluation (Signed)
  Anesthesia Post-op Note  Patient: Joel Bauer  Procedure(s) Performed: Procedure(s): COLONOSCOPY (N/A)  Patient Location: Endoscopy Unit  Anesthesia Type:MAC  Level of Consciousness: awake, alert , oriented and patient cooperative  Airway and Oxygen Therapy: Patient Spontanous Breathing  Post-op Pain: mild  Post-op Assessment: Post-op Vital signs reviewed, Patient's Cardiovascular Status Stable, Respiratory Function Stable, Patent Airway, No signs of Nausea or vomiting and Pain level controlled  Post-op Vital Signs: Reviewed and stable  Last Vitals:  Filed Vitals:   06/15/14 1430  BP: 118/53  Pulse: 66  Temp:   Resp: 19    Complications: No apparent anesthesia complications

## 2014-06-16 ENCOUNTER — Encounter (HOSPITAL_COMMUNITY): Payer: Self-pay | Admitting: Internal Medicine

## 2014-06-19 HISTORY — PX: OTHER SURGICAL HISTORY: SHX169

## 2014-06-24 ENCOUNTER — Other Ambulatory Visit: Payer: Self-pay | Admitting: Internal Medicine

## 2014-06-24 DIAGNOSIS — R197 Diarrhea, unspecified: Secondary | ICD-10-CM

## 2014-06-24 NOTE — Progress Notes (Signed)
Patient had FMT on 3/28 but started to have abdominal cramping last night. Today already had one semi formed stool plus 2 watery stools. He is concerned for recurrent cdifficile. Will check cdiff pcr on stool and proceed from there

## 2014-06-25 ENCOUNTER — Other Ambulatory Visit: Payer: Medicare Other

## 2014-06-25 ENCOUNTER — Other Ambulatory Visit: Payer: Self-pay | Admitting: Internal Medicine

## 2014-06-25 DIAGNOSIS — R197 Diarrhea, unspecified: Secondary | ICD-10-CM

## 2014-06-25 DIAGNOSIS — R1032 Left lower quadrant pain: Secondary | ICD-10-CM

## 2014-06-25 MED ORDER — DICYCLOMINE HCL 20 MG PO TABS: 20.0000 mg | ORAL_TABLET | Freq: Three times a day (TID) | ORAL | Status: DC

## 2014-06-25 NOTE — Progress Notes (Signed)
Had 3 loose/diarrheal stools yesterday but now improved. He has mostly abdominal cramping and small bm this morning. Will give him rx for bentyl. Follow up on pcr cdiff testing

## 2014-06-26 LAB — CLOSTRIDIUM DIFFICILE BY PCR: Toxigenic C. Difficile by PCR: NOT DETECTED

## 2014-06-29 NOTE — Telephone Encounter (Signed)
-----   Message from Inda Castle, MD sent at 06/28/2014 10:25 AM EDT ----- Eustaquio Maize, Please set up a return visit this week if possible

## 2014-06-29 NOTE — Telephone Encounter (Signed)
Spoke with the patient. He is agreeable to coming in on 07/02/14 at 2:30 pm

## 2014-07-02 ENCOUNTER — Encounter: Payer: Self-pay | Admitting: Gastroenterology

## 2014-07-02 ENCOUNTER — Ambulatory Visit (INDEPENDENT_AMBULATORY_CARE_PROVIDER_SITE_OTHER): Payer: Medicare Other | Admitting: Gastroenterology

## 2014-07-02 DIAGNOSIS — R1032 Left lower quadrant pain: Secondary | ICD-10-CM | POA: Diagnosis not present

## 2014-07-02 MED ORDER — HYOSCYAMINE SULFATE ER 0.375 MG PO TBCR: EXTENDED_RELEASE_TABLET | ORAL | Status: DC

## 2014-07-02 NOTE — Patient Instructions (Signed)
Take an over the counter Probiotic once daily for 14 days  Cc. Cletus Gash [name],MD

## 2014-07-02 NOTE — Assessment & Plan Note (Signed)
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  There is no evidence for recurrent pseudomembranous colitis.  Recommendations #1 begin flora Q or another probiotic for 2 weeks #2 substitute hyomax for dicyclomine; one tab twice a day for 5 days then as needed

## 2014-07-02 NOTE — Progress Notes (Signed)
      History of Present Illness:  Mr. Norcia underwent a fecal microbiology to transplant for recurrent felt well for approximately 10 days then developed loose stools and crampy lower abdominal pain.  The stools have since solidified but he still is complaining of crampy and fairly constant lower abdominal pain.  He has taken dicyclomine on occasion with some improvement.  He denies rectal bleeding.  A repeat C. difficile toxin was negative.    Review of Systems: Pertinent positive and negative review of systems were noted in the above HPI section. All other review of systems were otherwise negative.    Current Medications, Allergies, Past Medical History, Past Surgical History, Family History and Social History were reviewed in [facility] record  Vital signs were reviewed in today's medical record. Physical Exam: General: Well developed , well nourished, no acute distress Skin: anicteric Head: Normocephalic and atraumatic Eyes:  sclerae anicteric, EOMI Ears: Normal auditory acuity Mouth: No deformity or lesions Lungs: Clear throughout to auscultation Heart: Regular rate and rhythm; no murmurs, rubs or bruits Abdomen: Soft,  and non distended. No masses, hepatosplenomegaly or hernias noted. Normal Bowel sounds.  Is very mild tenderness to palpation in the lower quadrants bilaterally Rectal:deferred Musculoskeletal: Symmetrical with no gross deformities  Pulses:  Normal pulses noted Extremities: No clubbing, cyanosis, edema or deformities noted Neurological: Alert oriented x 4, grossly nonfocal Psychological:  Alert and cooperative. Normal mood and affect  See Assessment and Plan under Problem List

## 2014-07-16 ENCOUNTER — Other Ambulatory Visit: Payer: Self-pay | Admitting: Family Medicine

## 2014-07-16 NOTE — Telephone Encounter (Signed)
Refill appropriate and filled per protocol. 

## 2014-09-14 ENCOUNTER — Other Ambulatory Visit: Payer: Self-pay

## 2014-10-31 ENCOUNTER — Emergency Department (HOSPITAL_COMMUNITY): Payer: Medicare Other

## 2014-10-31 ENCOUNTER — Encounter (HOSPITAL_COMMUNITY): Payer: Self-pay | Admitting: *Deleted

## 2014-10-31 ENCOUNTER — Emergency Department (HOSPITAL_COMMUNITY)
Admission: EM | Admit: 2014-10-31 | Discharge: 2014-11-01 | Disposition: A | Discharge status: Home / Self Care | Payer: Medicare Other | Attending: Emergency Medicine | Admitting: Emergency Medicine

## 2014-10-31 DIAGNOSIS — Z86010 Personal history of colonic polyps: Secondary | ICD-10-CM | POA: Insufficient documentation

## 2014-10-31 DIAGNOSIS — M1990 Unspecified osteoarthritis, unspecified site: Secondary | ICD-10-CM | POA: Diagnosis not present

## 2014-10-31 DIAGNOSIS — Z79899 Other long term (current) drug therapy: Secondary | ICD-10-CM | POA: Insufficient documentation

## 2014-10-31 DIAGNOSIS — E785 Hyperlipidemia, unspecified: Secondary | ICD-10-CM | POA: Insufficient documentation

## 2014-10-31 DIAGNOSIS — K219 Gastro-esophageal reflux disease without esophagitis: Secondary | ICD-10-CM | POA: Insufficient documentation

## 2014-10-31 DIAGNOSIS — Z87442 Personal history of urinary calculi: Secondary | ICD-10-CM | POA: Insufficient documentation

## 2014-10-31 DIAGNOSIS — Z8619 Personal history of other infectious and parasitic diseases: Secondary | ICD-10-CM | POA: Insufficient documentation

## 2014-10-31 DIAGNOSIS — Z7952 Long term (current) use of systemic steroids: Secondary | ICD-10-CM | POA: Diagnosis not present

## 2014-10-31 DIAGNOSIS — R011 Cardiac murmur, unspecified: Secondary | ICD-10-CM | POA: Insufficient documentation

## 2014-10-31 DIAGNOSIS — I10 Essential (primary) hypertension: Secondary | ICD-10-CM | POA: Insufficient documentation

## 2014-10-31 DIAGNOSIS — Z85828 Personal history of other malignant neoplasm of skin: Secondary | ICD-10-CM | POA: Diagnosis not present

## 2014-10-31 DIAGNOSIS — K5732 Diverticulitis of large intestine without perforation or abscess without bleeding: Secondary | ICD-10-CM | POA: Diagnosis not present

## 2014-10-31 DIAGNOSIS — R109 Unspecified abdominal pain: Secondary | ICD-10-CM | POA: Diagnosis present

## 2014-10-31 DIAGNOSIS — R1031 Right lower quadrant pain: Secondary | ICD-10-CM

## 2014-10-31 LAB — LIPASE, BLOOD: Lipase: 26 U/L (ref 22–51)

## 2014-10-31 LAB — CBC WITH DIFFERENTIAL/PLATELET
BASOS ABS: 0.1 10*3/uL (ref 0.0–0.1)
Basophils Relative: 1 % (ref 0–1)
EOS ABS: 0.2 10*3/uL (ref 0.0–0.7)
Eosinophils Relative: 2 % (ref 0–5)
HCT: 43.3 % (ref 39.0–52.0)
Hemoglobin: 14.7 g/dL (ref 13.0–17.0)
LYMPHS ABS: 2.1 10*3/uL (ref 0.7–4.0)
LYMPHS PCT: 21 % (ref 12–46)
MCH: 30.2 pg (ref 26.0–34.0)
MCHC: 33.9 g/dL (ref 30.0–36.0)
MCV: 88.9 fL (ref 78.0–100.0)
Monocytes Absolute: 0.8 10*3/uL (ref 0.1–1.0)
Monocytes Relative: 8 % (ref 3–12)
NEUTROS ABS: 7 10*3/uL (ref 1.7–7.7)
NEUTROS PCT: 68 % (ref 43–77)
PLATELETS: 199 10*3/uL (ref 150–400)
RBC: 4.87 MIL/uL (ref 4.22–5.81)
RDW: 14.4 % (ref 11.5–15.5)
WBC: 10.2 10*3/uL (ref 4.0–10.5)

## 2014-10-31 LAB — COMPREHENSIVE METABOLIC PANEL
ALBUMIN: 3.6 g/dL (ref 3.5–5.0)
ALK PHOS: 71 U/L (ref 38–126)
ALT: 30 U/L (ref 17–63)
AST: 21 U/L (ref 15–41)
Anion gap: 9 (ref 5–15)
BILIRUBIN TOTAL: 0.6 mg/dL (ref 0.3–1.2)
BUN: 16 mg/dL (ref 6–20)
CO2: 27 mmol/L (ref 22–32)
CREATININE: 0.91 mg/dL (ref 0.61–1.24)
Calcium: 8.6 mg/dL — ABNORMAL LOW (ref 8.9–10.3)
Chloride: 102 mmol/L (ref 101–111)
GFR calc Af Amer: >60 mL/min (ref >60)
Glucose, Bld: 83 mg/dL (ref 65–99)
Potassium: 4.1 mmol/L (ref 3.5–5.1)
Sodium: 138 mmol/L (ref 135–145)
Total Protein: 6.6 g/dL (ref 6.5–8.1)

## 2014-10-31 MED ORDER — IOHEXOL 300 MG/ML  SOLN
25.0000 mL | Freq: Once | INTRAMUSCULAR | Status: AC | PRN
  Administered 2014-10-31: 25 mL via ORAL

## 2014-10-31 MED ORDER — SODIUM CHLORIDE 0.9 % IV SOLN
INTRAVENOUS | Status: DC
  Administered 2014-10-31: 22:00:00 via INTRAVENOUS

## 2014-10-31 MED ORDER — SODIUM CHLORIDE 0.9 % IV BOLUS (SEPSIS)
500.0000 mL | Freq: Once | INTRAVENOUS | Status: AC
  Administered 2014-10-31: 500 mL via INTRAVENOUS

## 2014-10-31 MED ORDER — POLYETHYLENE GLYCOL 3350 17 G PO PACK: 17.0000 g | PACK | Freq: Every day | ORAL | Status: DC

## 2014-10-31 MED ORDER — IOHEXOL 300 MG/ML  SOLN
100.0000 mL | Freq: Once | INTRAMUSCULAR | Status: AC | PRN
  Administered 2014-10-31: 100 mL via INTRAVENOUS

## 2014-10-31 MED ORDER — HYDROCODONE-ACETAMINOPHEN 5-325 MG PO TABS: 2.0000 | ORAL_TABLET | ORAL | Status: DC | PRN

## 2014-10-31 NOTE — ED Notes (Signed)
The pt is c/o abd pain since yesterday with sl diiarrhea.  He went to [facility] and was sen here for further tests.  Urine and chest xray done there

## 2014-10-31 NOTE — ED Provider Notes (Signed)
CSN: [id number]     Arrival date & time 10/31/14  1747 History   First MD Initiated Contact with Patient 10/31/14 2156     Chief Complaint  Patient presents with  . Abdominal Pain      HPI The pt is c/o abd pain since yesterday with sl diiarrhea. He went to [facility] and was sen here for further tests. Urine and chest xray done there.  Patient has previous history of C. difficile and has had a fecal transplant within the last 6 months.  Patient denies any fever.  Has had some slight diarrhea. Past Medical History  Diagnosis Date  . Adenomatous colon polyp 2001  . Diverticulosis of colon (without mention of hemorrhage) 2005  . Hypertension   . Hyperlipidemia   . Arthritis   . Shingles   . Pleurisy   . Prostatitis   . Retinal hemorrhage   . Heart murmur   . History of skin cancer   . GERD (gastroesophageal reflux disease)   . Hx of Clostridium difficile infection     3 MONTHS AGO  . History of kidney stones    Past Surgical History  Procedure Laterality Date  . Lithotripsy    . Tonsillectomy    . Cataracts removed    . Cystoscopy with retrograde pyelogram, ureteroscopy and stent placement Right 10/18/2013    Procedure: CYSTOSCOPY WITH RIGHT RETROGRADE/RIGHT URETEROSCOPY, STONE BASKETRY,  AND STENT PLACEMENT RIGHT;  Surgeon: Alexis Frock, MD;  Location: [facility];  Service: Urology;  Laterality: Right;  . Colonoscopy    . Colonoscopy N/A 06/15/2014    Procedure: COLONOSCOPY;  Surgeon: Gatha Mayer, MD;  Location: [facility];  Service: Endoscopy;  Laterality: N/A;  . Fmt  06/2014   Family History  Problem Relation Age of Onset  . Colon cancer Father   . Colon polyps Father   . Breast cancer Mother   . Heart disease Mother   . Kidney disease Sister    Social History  Substance Use Topics  . Smoking status: Never Smoker   . Smokeless tobacco: Never Used  . Alcohol Use: No    Review of Systems All other systems reviewed and are negative   Allergies    Review of patient's allergies indicates no known allergies.  Home Medications   Prior to Admission medications   Medication Sig Start Date End Date Taking? Authorizing Provider  CALCIUM PO Take 1 tablet by mouth daily.   Yes Historical Provider, MD  Coenzyme Q10 (COQ10 PO) Take 1 tablet by mouth daily.   Yes Historical Provider, MD  Hyoscyamine Sulfate 0.375 MG TBCR Take one tab twice a day for 5 days, then as needed, abdominal pain 07/02/14  Yes Inda Castle, MD  ibuprofen (ADVIL,MOTRIN) 200 MG tablet Take 200-400 mg by mouth every 6 (six) hours as needed for moderate pain.    Yes Historical Provider, MD  losartan (COZAAR) 100 MG tablet TAKE 1 TABLET BY MOUTH EVERY DAY 07/16/14  Yes Susy Frizzle, MD  LUTEIN PO Take 1 capsule by mouth daily.   Yes Historical Provider, MD  VITAMIN D, CHOLECALCIFEROL, PO Take 1 capsule by mouth daily.   Yes Historical Provider, MD  amoxicillin-clavulanate (AUGMENTIN) 500-125 MG per tablet Take 1 tablet (500 mg total) by mouth every 8 (eight) hours. 11/01/14   Leonard Schwartz, MD  HYDROcodone-acetaminophen (NORCO/VICODIN) 5-325 MG per tablet Take 2 tablets by mouth every 4 (four) hours as needed. 10/31/14   Leonard Schwartz, MD  polyethylene glycol (MIRALAX) packet Take 17 g by mouth daily. 10/31/14   Leonard Schwartz, MD  predniSONE (STERAPRED UNI-PAK 21 TAB) 10 MG (21) TBPK tablet Take 10 mg by mouth daily. 10/23/14   Historical Provider, MD   BP 136/74 mmHg  Pulse 67  Temp(Src) 97.7 F (36.5 C) (Oral)  Resp 21  Wt 171 lb (77.565 kg)  SpO2 96% Physical Exam  Constitutional: He is oriented to person, place, and time. He appears well-developed and well-nourished. No distress.  HENT:  Head: Normocephalic and atraumatic.  Eyes: Pupils are equal, round, and reactive to light.  Neck: Normal range of motion.  Cardiovascular: Normal rate and intact distal pulses.   Pulmonary/Chest: No respiratory distress.  Abdominal: Normal appearance. He exhibits no distension.  There is tenderness in the right lower quadrant. There is no rigidity, no rebound and no guarding.    Musculoskeletal: Normal range of motion.  Neurological: He is alert and oriented to person, place, and time. No cranial nerve deficit.  Skin: Skin is warm and dry. No rash noted.  Psychiatric: He has a normal mood and affect. His behavior is normal.  Nursing note and vitals reviewed.   ED Course  Procedures (including critical care time) Labs Review Labs Reviewed  COMPREHENSIVE METABOLIC PANEL - Abnormal; Notable for the following:    Calcium 8.6 (*)    All other components within normal limits  LIPASE, BLOOD  CBC WITH DIFFERENTIAL/PLATELET    Imaging Review Ct Abdomen Pelvis W Contrast  11/01/2014   CLINICAL DATA:  Acute onset of right lower quadrant abdominal pain and diarrhea. Initial encounter.  EXAM: CT ABDOMEN AND PELVIS WITH CONTRAST  TECHNIQUE: Multidetector CT imaging of the abdomen and pelvis was performed using the standard protocol following bolus administration of intravenous contrast.  CONTRAST:  163mL OMNIPAQUE IOHEXOL 300 MG/ML  SOLN  COMPARISON:  CT of the abdomen and pelvis performed 10/17/2013  FINDINGS: Minimal right basilar atelectasis is noted.  A 2.3 cm hemangioma is noted at the left hepatic lobe. A few tiny nonspecific hypodensities are seen within the liver. The spleen is unremarkable in appearance. The gallbladder is within normal limits. The pancreas and adrenal glands are unremarkable.  A few small bilateral renal cysts are seen, measuring up to 1.2 cm on the right. The kidneys are otherwise unremarkable. There is no evidence of hydronephrosis. No renal or ureteral stones are seen. No perinephric stranding is appreciated.  Mild wall thickening is noted along the proximal sigmoid colon, with mild soft tissue inflammation, concerning for mild acute diverticulitis. Diverticulosis is noted along the proximal sigmoid colon.  No free fluid is identified. The small bowel  is unremarkable in appearance. The stomach is within normal limits. No acute vascular abnormalities are seen. Scattered calcification is noted along the abdominal aorta and its branches.  The appendix is normal in caliber, without evidence of appendicitis. The remainder of the colon is grossly unremarkable in appearance  The bladder is moderately distended and grossly unremarkable. The prostate is enlarged, measuring 5.2 cm, with nodular enhancement and irregularity. No inguinal lymphadenopathy is seen.  No acute osseous abnormalities are identified.  IMPRESSION: 1. Mild acute diverticulitis at the proximal sigmoid colon, with mild soft tissue inflammation and wall thickening. No evidence of perforation or abscess formation at this time. 2. 2.3 cm hepatic hemangioma noted. Few tiny nonspecific hypodensities within the liver. 3. Few small bilateral renal cysts seen. 4. Scattered calcification along the abdominal aorta and its branches. 5. Enlarged prostate, with nodular  enhancement and irregularity. Would correlate with PSA.   Electronically Signed   By: Garald Balding M.D.   On: 11/01/2014 00:00   I, Britni Driscoll L, personally reviewed and evaluated these images and lab results as part of my medical decision-making.    MDM   Final diagnoses:  Right lower quadrant abdominal pain  Diverticulitis of large intestine without perforation or abscess without bleeding        Leonard Schwartz, MD 11/01/14 [phone]

## 2014-11-01 MED ORDER — KETOROLAC TROMETHAMINE 30 MG/ML IJ SOLN
30.0000 mg | Freq: Once | INTRAMUSCULAR | Status: AC
  Administered 2014-11-01: 30 mg via INTRAVENOUS

## 2014-11-01 MED ORDER — MORPHINE SULFATE 4 MG/ML IJ SOLN
4.0000 mg | Freq: Once | INTRAMUSCULAR | Status: DC
  Filled 2014-11-01: qty 1

## 2014-11-01 MED ORDER — PIPERACILLIN-TAZOBACTAM 3.375 G IVPB 30 MIN
3.3750 g | Freq: Once | INTRAVENOUS | Status: AC
  Administered 2014-11-01: 3.375 g via INTRAVENOUS
  Filled 2014-11-01: qty 50

## 2014-11-01 MED ORDER — PIPERACILLIN-TAZOBACTAM IN DEX 2-0.25 GM/50ML IV SOLN: 2.2500 g | Freq: Once | INTRAVENOUS | Status: DC

## 2014-11-01 MED ORDER — KETOROLAC TROMETHAMINE 30 MG/ML IJ SOLN
INTRAMUSCULAR | Status: AC
  Filled 2014-11-01: qty 1

## 2014-11-01 MED ORDER — AMOXICILLIN-POT CLAVULANATE 500-125 MG PO TABS: 1.0000 | ORAL_TABLET | Freq: Three times a day (TID) | ORAL | Status: DC

## 2014-11-01 NOTE — Discharge Instructions (Signed)
Abdominal Pain Many things can cause abdominal pain. Usually, abdominal pain is not caused by a disease and will improve without treatment. It can often be observed and treated at home. Your health care provider will do a physical exam and possibly order blood tests and X-rays to help determine the seriousness of your pain. However, in many cases, more time must pass before a clear cause of the pain can be found. Before that point, your health care provider may not know if you need more testing or further treatment. HOME CARE INSTRUCTIONS  Monitor your abdominal pain for any changes. The following actions may help to alleviate any discomfort you are experiencing:  Only take over-the-counter or prescription medicines as directed by your health care provider.  Do not take laxatives unless directed to do so by your health care provider.  Try a clear liquid diet (broth, tea, or water) as directed by your health care provider. Slowly move to a bland diet as tolerated. SEEK MEDICAL CARE IF:  You have unexplained abdominal pain.  You have abdominal pain associated with nausea or diarrhea.  You have pain when you urinate or have a bowel movement.  You experience abdominal pain that wakes you in the night.  You have abdominal pain that is worsened or improved by eating food.  You have abdominal pain that is worsened with eating fatty foods.  You have a fever. SEEK IMMEDIATE MEDICAL CARE IF:   Your pain does not go away within 2 hours.  You keep throwing up (vomiting).  Your pain is felt only in portions of the abdomen, such as the right side or the left lower portion of the abdomen.  You pass bloody or black tarry stools. MAKE SURE YOU:  Understand these instructions.   Will watch your condition.   Will get help right away if you are not doing well or get worse.  Document Released: 12/14/2004 Document Revised: 03/11/2013 Document Reviewed: 11/13/2012 [facility] Patient Information  2015 [place], [place]. This information is not intended to replace advice given to you by your health care provider. Make sure you discuss any questions you have with your health care provider.  Pain of Unknown Etiology (Pain Without a Known Cause) You have come to your caregiver because of pain. Pain can occur in any part of the body. Often there is not a definite cause. If your laboratory (blood or urine) work was normal and X-rays or other studies were normal, your caregiver may treat you without knowing the cause of the pain. An example of this is the headache. Most headaches are diagnosed by taking a history. This means your caregiver asks you questions about your headaches. Your caregiver determines a treatment based on your answers. Usually testing done for headaches is normal. Often testing is not done unless there is no response to medications. Regardless of where your pain is located today, you can be given medications to make you comfortable. If no physical cause of pain can be found, most cases of pain will gradually leave as suddenly as they came.  If you have a painful condition and no reason can be found for the pain, it is important that you follow up with your caregiver. If the pain becomes worse or does not go away, it may be necessary to repeat tests and look further for a possible cause.  Only take over-the-counter or prescription medicines for pain, discomfort, or fever as directed by your caregiver.  For the protection of your privacy, test results  cannot be given over the phone. Make sure you receive the results of your test. Ask how these results are to be obtained if you have not been informed. It is your responsibility to obtain your test results.  You may continue all activities unless the activities cause more pain. When the pain lessens, it is important to gradually resume normal activities. Resume activities by beginning slowly and gradually increasing the intensity and duration  of the activities or exercise. During periods of severe pain, bed rest may be helpful. Lie or sit in any position that is comfortable.  Ice used for acute (sudden) conditions may be effective. Use a large plastic bag filled with ice and wrapped in a towel. This may provide pain relief.  See your caregiver for continued problems. Your caregiver can help or refer you for exercises or physical therapy if necessary. If you were given medications for your condition, do not drive, operate machinery or power tools, or sign legal documents for 24 hours. Do not drink alcohol, take sleeping pills, or take other medications that may interfere with treatment. See your caregiver immediately if you have pain that is becoming worse and not relieved by medications. Document Released: 11/29/2000 Document Revised: 12/25/2012 Document Reviewed: 03/06/2005 [facility] Patient Information 2015 [place], [place]. This information is not intended to replace advice given to you by your health care provider. Make sure you discuss any questions you have with your health care provider.  Diverticulitis Diverticulitis is inflammation or infection of small pouches in your colon that form when you have a condition called diverticulosis. The pouches in your colon are called diverticula. Your colon, or large intestine, is where water is absorbed and stool is formed. Complications of diverticulitis can include:  Bleeding.  Severe infection.  Severe pain.  Perforation of your colon.  Obstruction of your colon. CAUSES  Diverticulitis is caused by bacteria. Diverticulitis happens when stool becomes trapped in diverticula. This allows bacteria to grow in the diverticula, which can lead to inflammation and infection. RISK FACTORS People with diverticulosis are at risk for diverticulitis. Eating a diet that does not include enough fiber from fruits and vegetables may make diverticulitis more likely to develop. SYMPTOMS  Symptoms of  diverticulitis may include:  Abdominal pain and tenderness. The pain is normally located on the left side of the abdomen, but may occur in other areas.  Fever and chills.  Bloating.  Cramping.  Nausea.  Vomiting.  Constipation.  Diarrhea.  Blood in your stool. DIAGNOSIS  Your health care provider will ask you about your medical history and do a physical exam. You may need to have tests done because many medical conditions can cause the same symptoms as diverticulitis. Tests may include:  Blood tests.  Urine tests.  Imaging tests of the abdomen, including X-rays and CT scans. When your condition is under control, your health care provider may recommend that you have a colonoscopy. A colonoscopy can show how severe your diverticula are and whether something else is causing your symptoms. TREATMENT  Most cases of diverticulitis are mild and can be treated at home. Treatment may include:  Taking over-the-counter pain medicines.  Following a clear liquid diet.  Taking antibiotic medicines by mouth for 7-10 days. More severe cases may be treated at a hospital. Treatment may include:  Not eating or drinking.  Taking prescription pain medicine.  Receiving antibiotic medicines through an IV tube.  Receiving fluids and nutrition through an IV tube.  Surgery. HOME CARE INSTRUCTIONS  Follow your health care provider's instructions carefully.  Follow a full liquid diet or other diet as directed by your health care provider. After your symptoms improve, your health care provider may tell you to change your diet. He or she may recommend you eat a high-fiber diet. Fruits and vegetables are good sources of fiber. Fiber makes it easier to pass stool.  Take fiber supplements or probiotics as directed by your health care provider.  Only take medicines as directed by your health care provider.  Keep all your follow-up appointments. SEEK MEDICAL CARE IF:   Your pain does not  improve.  You have a hard time eating food.  Your bowel movements do not return to normal. SEEK IMMEDIATE MEDICAL CARE IF:   Your pain becomes worse.  Your symptoms do not get better.  Your symptoms suddenly get worse.  You have a fever.  You have repeated vomiting.  You have bloody or black, tarry stools. MAKE SURE YOU:   Understand these instructions.  Will watch your condition.  Will get help right away if you are not doing well or get worse. Document Released: 12/14/2004 Document Revised: 03/11/2013 Document Reviewed: 01/29/2013 [facility] Patient Information 2015 [place], [place]. This information is not intended to replace advice given to you by your health care provider. Make sure you discuss any questions you have with your health care provider.

## 2014-11-02 ENCOUNTER — Telehealth: Payer: Self-pay | Admitting: Gastroenterology

## 2014-11-02 NOTE — Telephone Encounter (Signed)
Patient advised.

## 2014-11-02 NOTE — Telephone Encounter (Signed)
I agree with probiotics.  Nothing more to do clinically at this time.  He certainly should call if he develops diarrhea.

## 2014-11-02 NOTE — Telephone Encounter (Signed)
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 He has done well until the weekend when he developed diverticulitis. The ER physician put him on Augmentin and Miralax. Confirmed with the patient that he is taking probiotics. As one would expect, he is very apprehensive about the Augmentin. He asks you if he should schedule a follow up, watch for certain symptoms and in general looking for reassurance. Please advise?

## 2014-11-03 ENCOUNTER — Telehealth: Payer: Self-pay | Admitting: Gastroenterology

## 2014-11-03 NOTE — Telephone Encounter (Signed)
His abdominal pain is worsening. He has had to take pain medication. He does not feel he is improving. I have scheduled him for evaluation by Nicoletta Ba tomorrow. Is that ok?

## 2014-11-04 ENCOUNTER — Ambulatory Visit (INDEPENDENT_AMBULATORY_CARE_PROVIDER_SITE_OTHER): Payer: Medicare Other | Admitting: Physician Assistant

## 2014-11-04 ENCOUNTER — Other Ambulatory Visit (INDEPENDENT_AMBULATORY_CARE_PROVIDER_SITE_OTHER): Payer: Medicare Other

## 2014-11-04 ENCOUNTER — Encounter: Payer: Self-pay | Admitting: Physician Assistant

## 2014-11-04 VITALS — BP 130/70 | HR 76 | Ht 71.0 in | Wt 170.6 lb

## 2014-11-04 DIAGNOSIS — K5792 Diverticulitis of intestine, part unspecified, without perforation or abscess without bleeding: Secondary | ICD-10-CM

## 2014-11-04 LAB — CBC WITH DIFFERENTIAL/PLATELET
BASOS PCT: 0.5 % (ref 0.0–3.0)
Basophils Absolute: 0.1 10*3/uL (ref 0.0–0.1)
EOS ABS: 0.1 10*3/uL (ref 0.0–0.7)
EOS PCT: 1 % (ref 0.0–5.0)
HCT: 43.4 % (ref 39.0–52.0)
HEMOGLOBIN: 14.8 g/dL (ref 13.0–17.0)
LYMPHS ABS: 1.5 10*3/uL (ref 0.7–4.0)
Lymphocytes Relative: 13.4 % (ref 12.0–46.0)
MCHC: 34 g/dL (ref 30.0–36.0)
MCV: 90.2 fl (ref 78.0–100.0)
MONO ABS: 0.7 10*3/uL (ref 0.1–1.0)
Monocytes Relative: 6.5 % (ref 3.0–12.0)
NEUTROS ABS: 9.1 10*3/uL — AB (ref 1.4–7.7)
Neutrophils Relative %: 78.6 % — ABNORMAL HIGH (ref 43.0–77.0)
Platelets: 185 10*3/uL (ref 150.0–400.0)
RBC: 4.81 Mil/uL (ref 4.22–5.81)
RDW: 14.6 % (ref 11.5–15.5)
WBC: 11.5 10*3/uL — ABNORMAL HIGH (ref 4.0–10.5)

## 2014-11-04 MED ORDER — METRONIDAZOLE 500 MG PO TABS: 500.0000 mg | ORAL_TABLET | Freq: Two times a day (BID) | ORAL | Status: AC

## 2014-11-04 MED ORDER — OXYCODONE-ACETAMINOPHEN 7.5-325 MG PO TABS: 1.0000 | ORAL_TABLET | Freq: Four times a day (QID) | ORAL | Status: DC | PRN

## 2014-11-04 MED ORDER — SACCHAROMYCES BOULARDII 250 MG PO CAPS: 250.0000 mg | ORAL_CAPSULE | Freq: Two times a day (BID) | ORAL | Status: DC

## 2014-11-04 MED ORDER — AMOXICILLIN-POT CLAVULANATE 500-125 MG PO TABS: 1.0000 | ORAL_TABLET | Freq: Three times a day (TID) | ORAL | Status: DC

## 2014-11-04 NOTE — Telephone Encounter (Signed)
yes

## 2014-11-04 NOTE — Patient Instructions (Signed)
Please go to the basement level to have your labs drawn.  We have given you a prescription to take to the pharmacy, Percocet 7.5/325 mg.  Continue the Augmentin 500 mg/125 mg. . We sent 3 more days to the pharmacy. We sent a prescription for Flagyl and Florastor to [facility].

## 2014-11-04 NOTE — Progress Notes (Signed)
Patient ID: Joel Bauer, male   DOB: November 18, 1933, 81 y.o.   MRN: [id number]   Subjective:    Patient ID: Joel Bauer, male    DOB: 03-Oct-1933, 81 y.o.   MRN: [id number]  HPI [name] is a pleasant 81 year old white male known to 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 He has not had a recurrence since. Unfortunately he developed left lower quadrant pain over this past weekend and went to the emergency room on 10/31/2014. He was diagnosed with diverticulitis. He had CT scan that showed a 2.3 cm hemangioma in the left hepatic lobe and a few small's bilateral renal cysts. He was noted to have some mild wall thickening along the proximal sigmoid colon with mild soft tissue inflammation as well consistent with acute diverticulitis. Also noted and enlarged irregular somewhat nodular prostate. CBC was unremarkable. He was started on Augmentin 500/125 one by mouth 3 times a day and given hydrocodone for pain. He comes into the office today after calling yesterday stating that he was still having a lot of abdominal pain and was feeling worse. Patient does not feel that he  had any fever over the past couple of days, and no chills, he is able to eat a soft bland diet. He said he had constant pain for 48 hours after he had been in the emergency room but since last night he is now having more intermittent left lower quadrant pain which she says is sharp at times and then is easing off in between. It had some mild problems with constipation but is having bowel movements. He does not feel that the pain medication has helped him much at all. Also had been dealing with an upper respiratory infection over the past 3 weeks and feels somewhat drained from lap prior to becoming ill with diverticulitis. He has been on a probiotic, generic  brand.  Review of Systems Pertinent positive and negative review of systems were noted in the above HPI section.  All other review of systems was otherwise negative.  Outpatient Encounter Prescriptions as of 11/04/2014  Medication Sig  . amoxicillin-clavulanate (AUGMENTIN) 500-125 MG per tablet Take 1 tablet (500 mg total) by mouth 3 (three) times daily.  Marland Kitchen CALCIUM PO Take 1 tablet by mouth daily.  . Cholecalciferol (VITAMIN D3) 2000 UNITS TABS Take 1 tablet by mouth daily.  . Coenzyme Q10 (COQ10 PO) Take 1 tablet by mouth daily.  Marland Kitchen HYDROcodone-acetaminophen (NORCO/VICODIN) 5-325 MG per tablet Take 2 tablets by mouth every 4 (four) hours as needed.  Marland Kitchen Hyoscyamine Sulfate 0.375 MG TBCR Take one tab twice a day for 5 days, then as needed, abdominal pain  . ibuprofen (ADVIL,MOTRIN) 200 MG tablet Take 200-400 mg by mouth every 6 (six) hours as needed for moderate pain.   Marland Kitchen losartan (COZAAR) 100 MG tablet TAKE 1 TABLET BY MOUTH EVERY DAY  . LUTEIN PO Take 1 capsule by mouth daily.  . polyethylene glycol (MIRALAX) packet Take 17 g by mouth daily.  . predniSONE (STERAPRED UNI-PAK 21 TAB) 10 MG (21) TBPK tablet Take 10 mg by mouth daily.  . [DISCONTINUED] amoxicillin-clavulanate (AUGMENTIN) 500-125 MG per tablet Take 1 tablet (500 mg total) by mouth every 8 (eight) hours.  . metroNIDAZOLE (FLAGYL) 500 MG tablet Take 1 tablet (500 mg total) by mouth 2 (two) times daily.  Marland Kitchen  oxyCODONE-acetaminophen (PERCOCET) 7.5-325 MG per tablet Take 1 tablet by mouth every 6 (six) hours as needed for severe pain.  Marland Kitchen saccharomyces boulardii (FLORASTOR) 250 MG capsule Take 1 capsule (250 mg total) by mouth 2 (two) times daily.  . [DISCONTINUED] VITAMIN D, CHOLECALCIFEROL, PO Take 1 capsule by mouth daily.   No facility-administered encounter medications on file as of 11/04/2014.   No Known Allergies Patient Active Problem List   Diagnosis Date Noted  . 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 History of  Clostridium difficile 12/17/2013  . History of diverticulitis 12/17/2013  . Hx of pseudomembranous enterocolitis 04/17/2013  . Abdominal pain, left lower quadrant 04/13/2013  . Glaucoma    Social History   Social History  . Marital Status: Married    Spouse Name: N/A  . Number of Children: 1  . Years of Education: N/A   Occupational History  . Retired     Social History Main Topics  . Smoking status: Never Smoker   . Smokeless tobacco: Never Used  . Alcohol Use: No  . Drug Use: No  . Sexual Activity: Not on file   Other Topics Concern  . Not on file   Social History Narrative    [name] family history includes Breast cancer in his mother; Colon cancer in his father; Colon polyps in his father; Heart disease in his mother; Kidney disease in his sister.      Objective:    Filed Vitals:   11/04/14 1428  BP: 130/70  Pulse: 76    Physical Exam  well-developed older white male in no acute distress, uncomfortable-appearing blood pressure 130/70 pulse 76 height 5 foot 11 weight 170. HEENT; nontraumatic normocephalic EOMI PERRLA sclera anicteric, Supple; no JVD, Cardiovascular; regular rate and rhythm with S1-S2 no murmur or gallop, Pulmonary; clear bilaterally, Abdomen; soft bowel sounds are present he is tender in the left mid and left lower quadrant tenderness in the suprapubic area there is no guarding or rebound no palpable mass or hepatosplenomegaly, Rectal; exam not done, Ext; no clubbing cyanosis or edema skin warm and dry, Neuropsych mood and affect appropriate       Assessment & Plan:   #1 81 yo male with acute diverticulitis. ER visit 8/13, pain somewhat improved finally today. #2 Hx of multiple relapses of Cdiff colitis - s/p FMT 05/2014 with success #3 Enlarged ,nodular prostate  Plan; CBC  Today  Continue Augmentin 500/125  TID x 10 days total  Add flagyl 500 mg po BID x 10 days  Start Florastor BID x one month Stop Vicodin and gave Rx for percocet  7.5/325 ,one po q 6 hours prn Push fluids, and continue soft bland diet until pain resolves  He has follow up with dr Deatra Ina next week in office, and know to seek ER care if pain significantly worsens in the interim. Also advised to call if his pain had not completely resolved when finishes antibiotics-as may need a longer course (judicious use with hx of recurrent cdiff)     Amy Genia Harold PA-C 11/04/2014   Cc: Susy Frizzle, MD

## 2014-11-04 NOTE — Telephone Encounter (Signed)
Confirmed with the patient.

## 2014-11-09 ENCOUNTER — Encounter: Payer: Self-pay | Admitting: Gastroenterology

## 2014-11-09 ENCOUNTER — Ambulatory Visit (INDEPENDENT_AMBULATORY_CARE_PROVIDER_SITE_OTHER): Payer: Medicare Other | Admitting: Gastroenterology

## 2014-11-09 VITALS — BP 126/64 | HR 66 | Wt 169.0 lb

## 2014-11-09 DIAGNOSIS — K5712 Diverticulitis of small intestine without perforation or abscess without bleeding: Secondary | ICD-10-CM | POA: Diagnosis not present

## 2014-11-09 NOTE — Patient Instructions (Signed)
Follow up as needed

## 2014-11-09 NOTE — Progress Notes (Signed)
      History of Present Illness:  Joel Bauer has returned for follow-up of abdominal pain.  He was diagnosed with diverticulitis and placed on antibiotics including Augmentin and Flagyl.  He reports improvement in his lower abdominal pain.  He still has sharp pain when he moves his bowels.  There is been no fever or bleeding.    Review of Systems: Pertinent positive and negative review of systems were noted in the above HPI section. All other review of systems were otherwise negative.    Current Medications, Allergies, Past Medical History, Past Surgical History, Family History and Social History were reviewed in [name] record  Vital signs were reviewed in today's medical record. Physical Exam: General: Well developed , well nourished, no acute distress On abdominal exam there is minimal tenderness to palpation in the left lower quadrant.  There is no guarding or rebound  See Assessment and Plan under Problem List

## 2014-11-09 NOTE — Assessment & Plan Note (Signed)
Recurrent acute diverticulitis documented by CT scan.  Patient is responding to antibiotic therapy.  He will be given a 14 day course of antibiotics and instructed to stay on a low fiber diet for least another 2 weeks.  He was also advised to contact the office immediately should he develop diarrhea reminiscent of Pseudomonas colitis.

## 2014-11-12 ENCOUNTER — Ambulatory Visit (INDEPENDENT_AMBULATORY_CARE_PROVIDER_SITE_OTHER): Payer: Medicare Other | Admitting: Family Medicine

## 2014-11-12 ENCOUNTER — Encounter: Payer: Self-pay | Admitting: Family Medicine

## 2014-11-12 VITALS — BP 108/60 | HR 78 | Temp 98.6°F | Resp 16 | Ht 71.0 in | Wt 171.0 lb

## 2014-11-12 DIAGNOSIS — N429 Disorder of prostate, unspecified: Secondary | ICD-10-CM

## 2014-11-12 NOTE — Progress Notes (Signed)
Subjective:    Patient ID: Joel Bauer, male    DOB: 04/01/1933, 81 y.o.   MRN: [id number]  HPI  patient was recently seen in the emergency room for diverticulitis an abdominal pain. A CT scan was performed a digital diverticulitis. He is currently under treatment with this with his gastroenterologist. However he was found to have an enlarged prostate 5 cm in diameter with nodular irregularities. On digital rectal exam today, the prostate is +3 in size firm with a nodular area on the right hemisphere.. Patient also reports constant congestion and pressure in both maxillary sinuses and in his frontal sinuses are more than 6 weeks. Past Medical History  Diagnosis Date  . Adenomatous colon polyp 2001  . Diverticulosis of colon (without mention of hemorrhage) 2005  . Hypertension   . Hyperlipidemia   . Arthritis   . Shingles   . Pleurisy   . Prostatitis   . Retinal hemorrhage   . Heart murmur   . History of skin cancer   . GERD (gastroesophageal reflux disease)   . Hx of Clostridium difficile infection     3 MONTHS AGO  . History of kidney stones    Past Surgical History  Procedure Laterality Date  . Lithotripsy    . Tonsillectomy    . Cataracts removed    . Cystoscopy with retrograde pyelogram, ureteroscopy and stent placement Right 10/18/2013    Procedure: CYSTOSCOPY WITH RIGHT RETROGRADE/RIGHT URETEROSCOPY, STONE BASKETRY,  AND STENT PLACEMENT RIGHT;  Surgeon: Alexis Frock, MD;  Location: [facility];  Service: Urology;  Laterality: Right;  . Colonoscopy    . Colonoscopy N/A 06/15/2014    Procedure: COLONOSCOPY;  Surgeon: Gatha Mayer, MD;  Location: [facility];  Service: Endoscopy;  Laterality: N/A;  . Fmt  06/2014   Current Outpatient Prescriptions on File Prior to Visit  Medication Sig Dispense Refill  . CALCIUM PO Take 1 tablet by mouth daily.    . Cholecalciferol (VITAMIN D3) 2000 UNITS TABS Take 1 tablet by mouth daily.    . Coenzyme Q10 (COQ10 PO) Take 1 tablet by  mouth daily.    Marland Kitchen HYDROcodone-acetaminophen (NORCO/VICODIN) 5-325 MG per tablet Take 2 tablets by mouth every 4 (four) hours as needed. 10 tablet 0  . Hyoscyamine Sulfate 0.375 MG TBCR Take one tab twice a day for 5 days, then as needed, abdominal pain 25 tablet 1  . ibuprofen (ADVIL,MOTRIN) 200 MG tablet Take 200-400 mg by mouth every 6 (six) hours as needed for moderate pain.     Marland Kitchen losartan (COZAAR) 100 MG tablet TAKE 1 TABLET BY MOUTH EVERY DAY 30 tablet 5  . LUTEIN PO Take 1 capsule by mouth daily.    . metroNIDAZOLE (FLAGYL) 500 MG tablet Take 1 tablet (500 mg total) by mouth 2 (two) times daily. 20 tablet 0  . oxyCODONE-acetaminophen (PERCOCET) 7.5-325 MG per tablet Take 1 tablet by mouth every 6 (six) hours as needed for severe pain. 30 tablet 0  . saccharomyces boulardii (FLORASTOR) 250 MG capsule Take 1 capsule (250 mg total) by mouth 2 (two) times daily. 60 capsule 0   No current facility-administered medications on file prior to visit.   No Known Allergies Social History   Social History  . Marital Status: Married    Spouse Name: N/A  . Number of Children: 1  . Years of Education: N/A   Occupational History  . Retired     Social History Main Topics  . Smoking status: Never  Smoker   . Smokeless tobacco: Never Used  . Alcohol Use: No  . Drug Use: No  . Sexual Activity: Not on file   Other Topics Concern  . Not on file   Social History Narrative      Review of Systems  All other systems reviewed and are negative.      Objective:   Physical Exam  HENT:  Right Ear: External ear normal.  Left Ear: External ear normal.  Nose: Nose normal.  Mouth/Throat: Oropharynx is clear and moist. No oropharyngeal exudate.  Cardiovascular: Normal rate, regular rhythm and normal heart sounds.   Pulmonary/Chest: Effort normal and breath sounds normal.  Abdominal: Soft. Bowel sounds are normal.  Genitourinary: Prostate is enlarged.          Assessment & Plan:    Abnormal prostate by palpation - Plan: PSA, total and free   Patient's prostate exam is abnormal. I am suspicious he may have prostate cancer. I will check the PSA. However I would also recommend a urology consultation given abnormalities I palpate on exam. I believe the patient may have chronic sinusitis. I recommended Flonase 2 sprays each nostril daily and Zyrtec 10 mg daily. If no better consider a CT scan of the sinuses to evaluate for chronic sinusitis. He is been on and off anabolic for over a month without benefit and therefore I think  Repeat doses of antibodies will be ineffective

## 2014-11-13 ENCOUNTER — Encounter: Payer: Self-pay | Admitting: Family Medicine

## 2014-11-13 LAB — PSA, TOTAL AND FREE
PSA, Free Pct: 23 % — ABNORMAL LOW (ref >25)
PSA, Free: 1.1 ng/mL
PSA: 4.75 ng/mL — AB (ref <=4.00)

## 2014-11-21 ENCOUNTER — Telehealth: Payer: Self-pay | Admitting: Internal Medicine

## 2014-11-21 MED ORDER — AMOXICILLIN-POT CLAVULANATE 875-125 MG PO TABS: 1.0000 | ORAL_TABLET | Freq: Two times a day (BID) | ORAL | Status: DC

## 2014-11-21 MED ORDER — METRONIDAZOLE 500 MG PO TABS: 500.0000 mg | ORAL_TABLET | Freq: Three times a day (TID) | ORAL | Status: DC

## 2014-11-21 NOTE — Telephone Encounter (Signed)
Contacted by patient and his son this morning Pt of [name] Treated in mid-Aug 2016 for sigmoid diverticulitis documented by CT scan. Treated with flagyl and augmentin to resolution.  No pain or symptoms for the last 7 days (finished abx 7 days ago) Now in [place] on vacation with his son. Last evening exact pain as with diverticulitis returned and has been worsening, now 7/10.  No fever, nausea or vomiting.  No rectal bleeding or melena. Has hx of c diff and s/p FMT so he is concerned about taking abx again  Plan: expect recurrence of sigmoid diverticulitis given recent history and similar symptoms shortly after treatment Will restart flagyl 500 mg tid x 14 days and augmentin 875 mg BID x 10 days Tramadol 50 mg q6h prn pain #20 (he left previously rx'ed pain meds at home because he was feeling better) Florastor 250 mg BID recommended as well I advised he seek care in [place] if worsening, not improving or should he develop fevers, chills, worsening abd pain or bleeding.  Both he and his son voiced understanding He will return to [facility] on Tuesday, need office followup with Deatra Ina on APP for reassessment

## 2014-11-24 NOTE — Telephone Encounter (Signed)
Patient is traveling. Information left on the voicemail with appointment information for 11/26/14 at 8:30 am.

## 2014-11-26 ENCOUNTER — Encounter: Payer: Self-pay | Admitting: Nurse Practitioner

## 2014-11-26 ENCOUNTER — Ambulatory Visit (INDEPENDENT_AMBULATORY_CARE_PROVIDER_SITE_OTHER): Payer: Medicare Other | Admitting: Nurse Practitioner

## 2014-11-26 VITALS — BP 136/70 | HR 70 | Ht 71.0 in | Wt 168.5 lb

## 2014-11-26 DIAGNOSIS — R1032 Left lower quadrant pain: Secondary | ICD-10-CM

## 2014-11-26 DIAGNOSIS — K5732 Diverticulitis of large intestine without perforation or abscess without bleeding: Secondary | ICD-10-CM | POA: Diagnosis not present

## 2014-11-26 HISTORY — DX: Left lower quadrant pain: R10.32

## 2014-11-26 IMAGING — CT CT ABD-PELV W/ CM
3 of 5 series · 13 of 36 positions shown, 19 images · IV contrast (omnipaque)
Comparison: 09/12/2008

CLINICAL DATA: Left lower quadrant abdominal pain.  History of
diverticulitis.

CT ABDOMEN AND PELVIS WITH CONTRAST
TECHNIQUE: Multidetector CT imaging of the abdomen and pelvis was
performed following the standard protocol during bolus
administration of intravenous contrast.
Contrast: 100mL OMNIPAQUE IOHEXOL 300 MG/ML  SOLN

[Series 3: abd/pelvis with · axial · 0.75mm/px · z∈[-377,+8]mm · 8 of 101 slices shown, 13 images]
[im 12/101  soft-tissue]
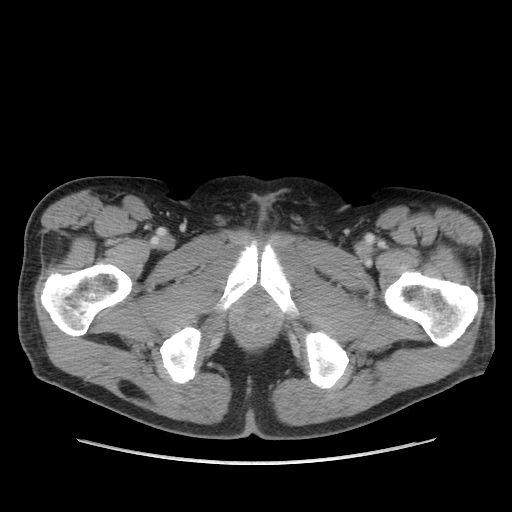
[im 12/101  bone]
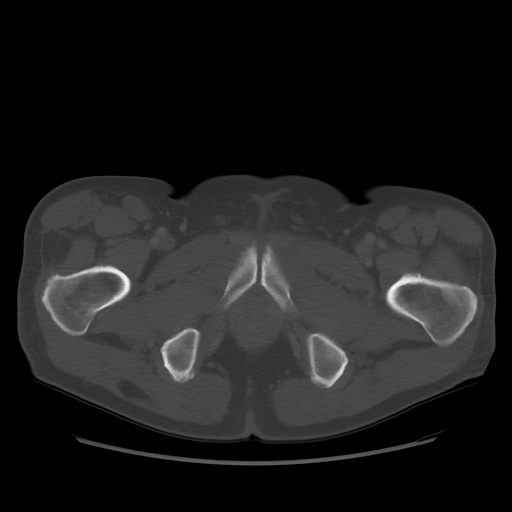
[im 23/101  soft-tissue]
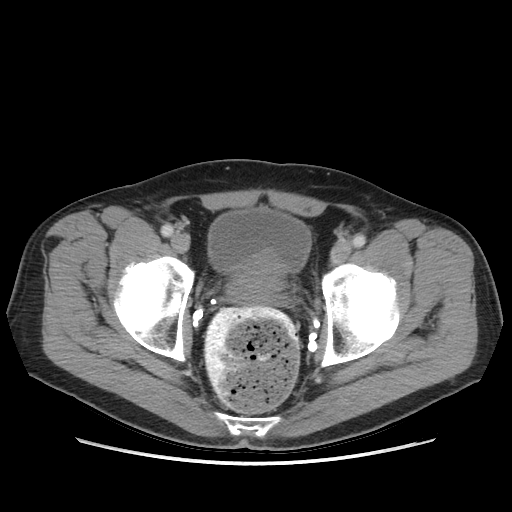
[im 34/101  soft-tissue]
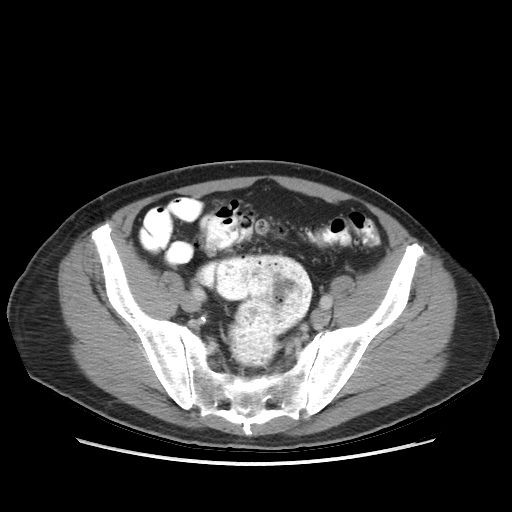
[im 45/101  soft-tissue]
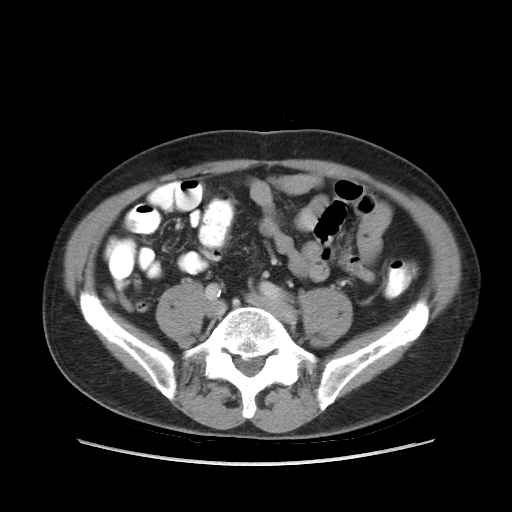
[im 56/101  soft-tissue]
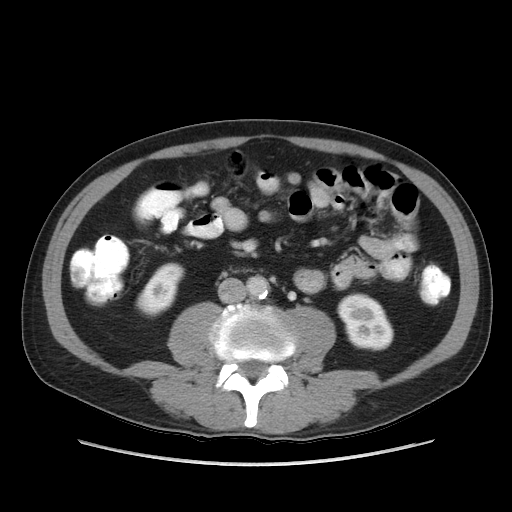
[im 56/101  lung]
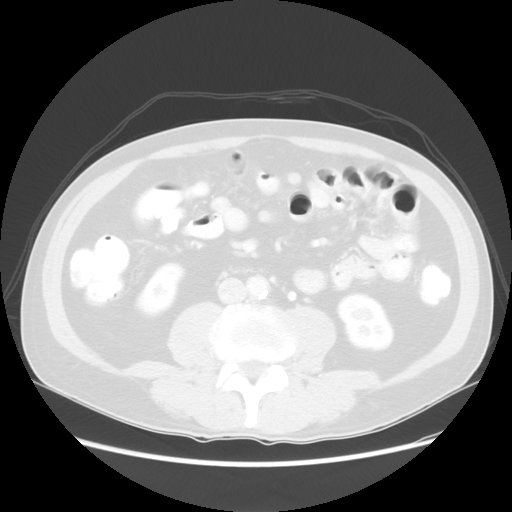
[im 67/101  soft-tissue]
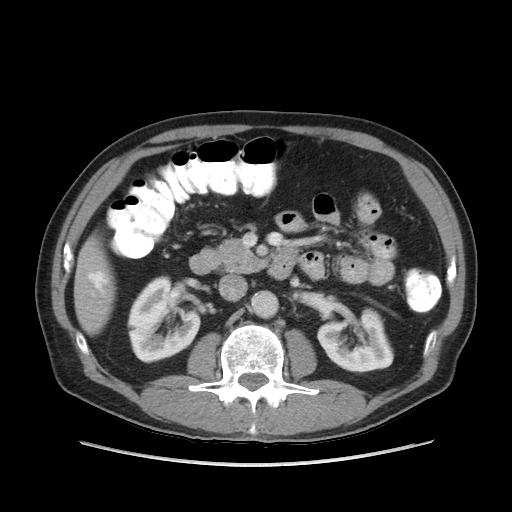
[im 67/101  lung]
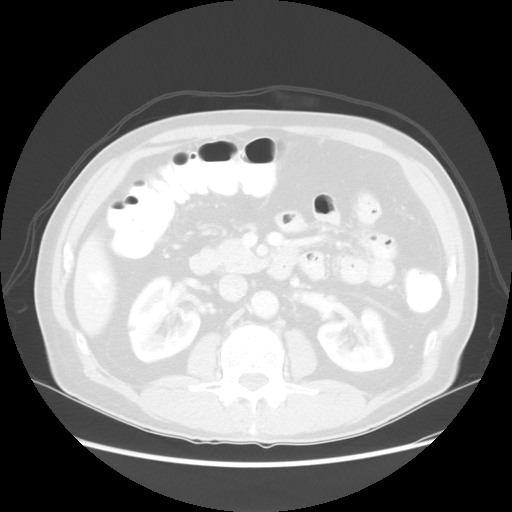
[im 78/101  soft-tissue]
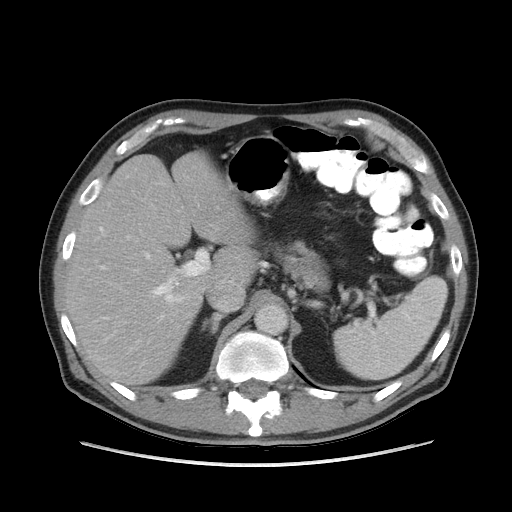
[im 78/101  lung]
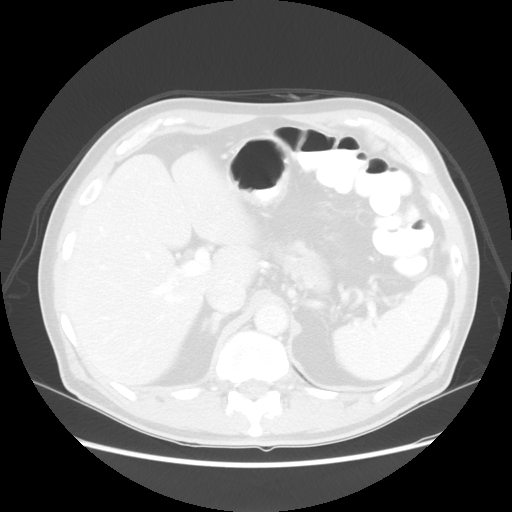
[im 89/101  soft-tissue]
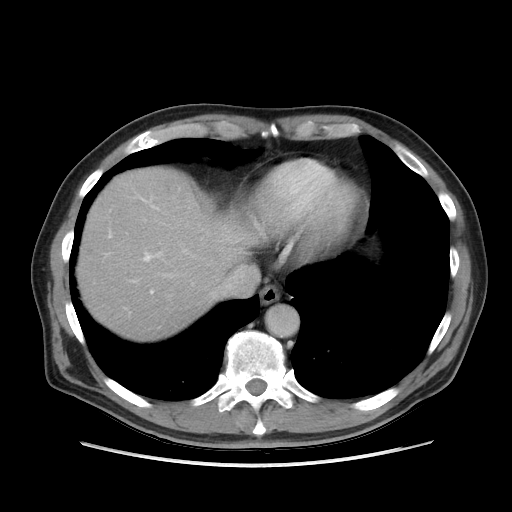
[im 89/101  lung]
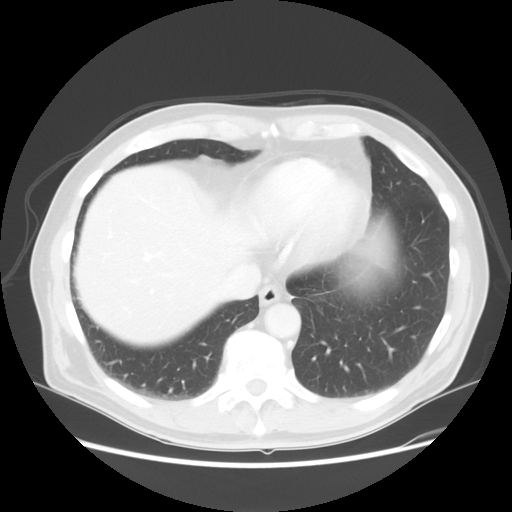

[Series 601: coronal body · coronal · 0.98mm/px · 1 of 109 slices shown, 2 images]
[im 37/109  soft-tissue]
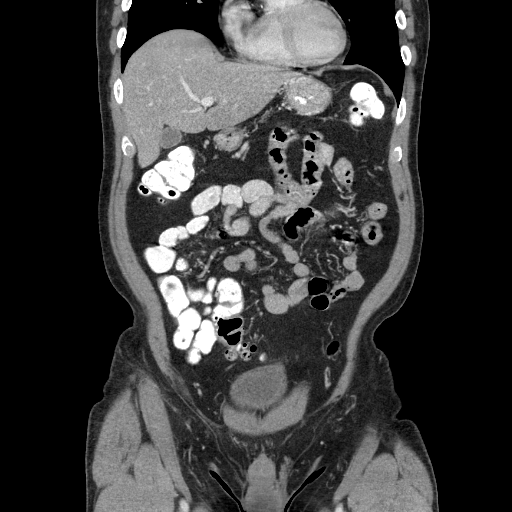
[im 37/109  bone]
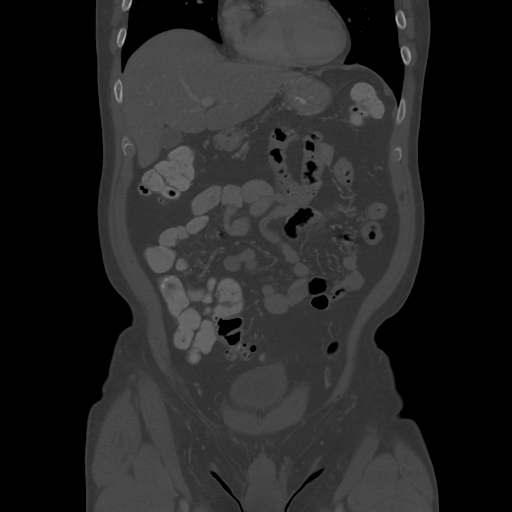

[Series 602: sagittal body · sagittal · 0.98mm/px · 4 of 152 slices shown]
[im 11/152  soft-tissue]
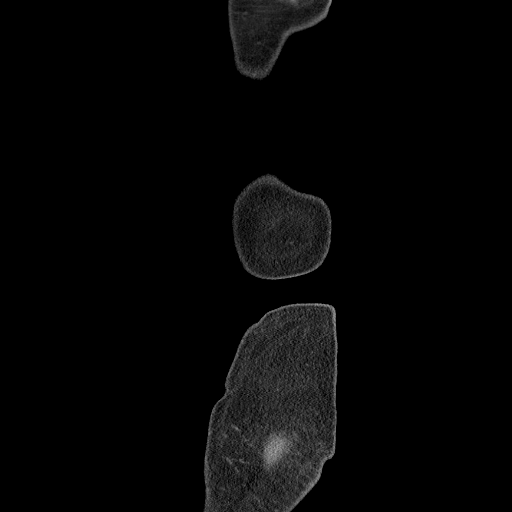
[im 31/152  soft-tissue]
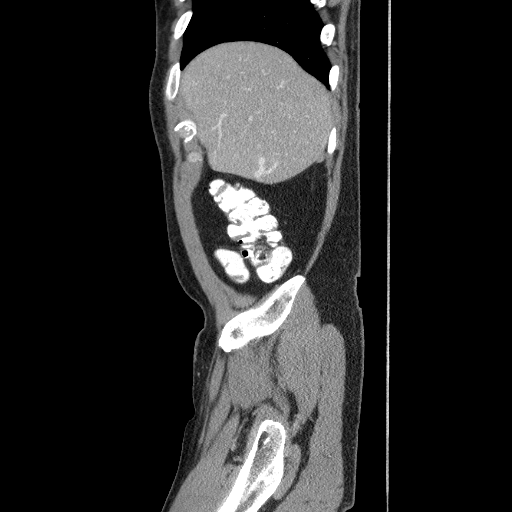
[im 51/152  soft-tissue]
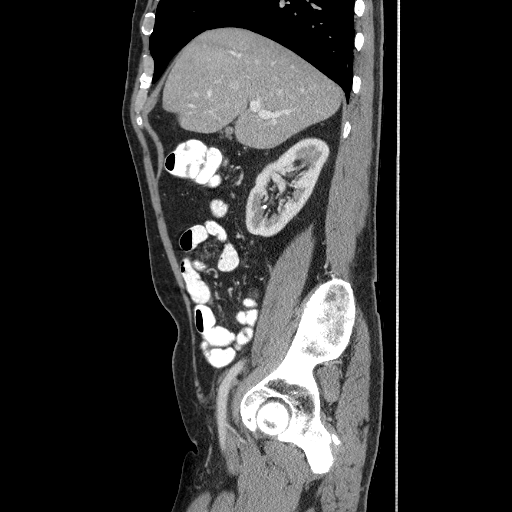
[im 71/152  soft-tissue]
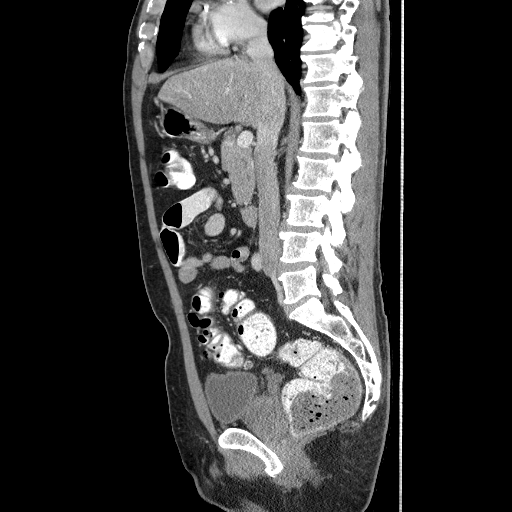

[13 of 36 positions shown; findings below may reference images not displayed]

FINDINGS: The liver, spleen, pancreas, and adrenal glands have a
normal appearance.  There is a 2 mm lower pole right renal calculus
present without hydronephrosis or caliectasis.  There is a 9 mm
stable cortical right renal cyst present posteriorly located.
There also is a tiny (5 mm) mid left renal cyst laterally located.
There is no retroperitoneal or mesenteric adenopathy.  The
abdominal aorta is atherosclerotic but not aneurysmal.  There are
changes of diverticulosis involving the sigmoid colon without
evidence for diverticulitis.  Prostate gland is diffusely enlarged
measuring 6.3 x 4.7 x 5.4 cm in size.  The urinary bladder is
normal in size.  There is no evidence for pelvic adenopathy.
IMPRESSION: 1.  Sigmoid colonic diverticulosis without evidence for
diverticulitis.
2.  Enlarged prostate gland.
3.  2 mm lower pole right renal calculus.
4.  Small bilateral renal cysts.

## 2014-11-26 NOTE — Progress Notes (Signed)
History of Present Illness:  Patient is a 81 year old male known to [name]. He had sigmoid diverticulitis July 2014. Treatment was complicated by C-diff.   Patient was seen approximately 2 weeks ago for follow-up of another episode of sigmoid diverticulitis documented by CTscan 10/31/14. Symptoms improved with antibiotics but patient called the office a few days ago with  recurrent LLQ pain one week after completion of treatment. Given history of C. Difficile patient was concerned about taking additional antibiotics. It seem likely that patient was relapsing so he did agree to restart Flagyl and Augmentin. His abdominal pain is better but overall patient just does not feel well. His appetite is poor, he is tired. No fever. Bowels are moving normally.  Current Medications, Allergies, Past Medical History, Past Surgical History, Family History and Social History were reviewed in [employer] record.  Studies:   Ct Abdomen Pelvis W Contrast  11/01/2014   CLINICAL DATA:  Acute onset of right lower quadrant abdominal pain and diarrhea. Initial encounter.  EXAM: CT ABDOMEN AND PELVIS WITH CONTRAST  TECHNIQUE: Multidetector CT imaging of the abdomen and pelvis was performed using the standard protocol following bolus administration of intravenous contrast.  CONTRAST:  144mL OMNIPAQUE IOHEXOL 300 MG/ML  SOLN  COMPARISON:  CT of the abdomen and pelvis performed 10/17/2013  FINDINGS: Minimal right basilar atelectasis is noted.  A 2.3 cm hemangioma is noted at the left hepatic lobe. A few tiny nonspecific hypodensities are seen within the liver. The spleen is unremarkable in appearance. The gallbladder is within normal limits. The pancreas and adrenal glands are unremarkable.  A few small bilateral renal cysts are seen, measuring up to 1.2 cm on the right. The kidneys are otherwise unremarkable. There is no evidence of hydronephrosis. No renal or ureteral stones are seen. No  perinephric stranding is appreciated.  Mild wall thickening is noted along the proximal sigmoid colon, with mild soft tissue inflammation, concerning for mild acute diverticulitis. Diverticulosis is noted along the proximal sigmoid colon.  No free fluid is identified. The small bowel is unremarkable in appearance. The stomach is within normal limits. No acute vascular abnormalities are seen. Scattered calcification is noted along the abdominal aorta and its branches.  The appendix is normal in caliber, without evidence of appendicitis. The remainder of the colon is grossly unremarkable in appearance  The bladder is moderately distended and grossly unremarkable. The prostate is enlarged, measuring 5.2 cm, with nodular enhancement and irregularity. No inguinal lymphadenopathy is seen.  No acute osseous abnormalities are identified.  IMPRESSION: 1. Mild acute diverticulitis at the proximal sigmoid colon, with mild soft tissue inflammation and wall thickening. No evidence of perforation or abscess formation at this time. 2. 2.3 cm hepatic hemangioma noted. Few tiny nonspecific hypodensities within the liver. 3. Few small bilateral renal cysts seen. 4. Scattered calcification along the abdominal aorta and its branches. 5. Enlarged prostate, with nodular enhancement and irregularity. Would correlate with PSA.   Electronically Signed   By: Garald Balding M.D.   On: 11/01/2014 00:00   Physical Exam: General: Pleasant, thin white male in no acute distress Head: Normocephalic and atraumatic Ears: Normal auditory acuity Lungs: Clear throughout to auscultation Heart: Regular rate and rhythm Abdomen: Soft, non distended, moderate LLQ, mid lower abdominal tenderness. No masses, no hepatomegaly. Normal bowel sounds Extremities: No edema  Neurological: Alert oriented x 4, grossly nonfocal Psychological:  Alert and cooperative. Normal mood and affect  Assessment and Recommendations:   81  year old male with recurrent  sigmoid diverticulitis. His first episode was July 2014. Most recent episode was mid August. Patient relapsed one week after treatment and is back on Augmentin and flagyl. Pain is better, still moderately tender on exam.   Complete antibiotics then call with a condition update.  Continue low fiber diet until completion of antibiotics.  Should symptoms worsen, call ASAP as repeat CTscan may be warranted.   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 Continue Florastor.

## 2014-11-26 NOTE — Patient Instructions (Signed)
Call us in 2 weeks with an update. You can ask for a nurse.  Dr. Celesta Aver nurse is Barbera Setters.

## 2014-11-27 ENCOUNTER — Encounter: Payer: Self-pay | Admitting: Nurse Practitioner

## 2014-11-27 NOTE — Progress Notes (Signed)
Reviewed and agree with management. Olisa Quesnel D. Farin Buhman, M.D., FACG  

## 2014-12-01 ENCOUNTER — Telehealth: Payer: Self-pay

## 2014-12-01 NOTE — Telephone Encounter (Signed)
Patient is concerned he may need the surgery as discussed at his last appointment. Finishing the atb's on 12/04/14. He leaves a note for me at the front desk asking if an appointment should be scheduled to avoid delaying treatment while waiting for an appointment. No answer at the home number or the cell.

## 2014-12-02 NOTE — Telephone Encounter (Signed)
See the note below. Should he go talk to the surgeon? He has seen some improvement though there are still 15-20 seconds of pain flashes. It is not 100% and he finishes ATB's on 9/16. Please advise.

## 2014-12-03 NOTE — Telephone Encounter (Signed)
Please refer him to Dr. Fanny Skates

## 2014-12-03 NOTE — Telephone Encounter (Signed)
Patient notified. Referral information faxed after a phone call to [facility].

## 2014-12-11 ENCOUNTER — Telehealth: Payer: Self-pay | Admitting: Nurse Practitioner

## 2014-12-11 ENCOUNTER — Other Ambulatory Visit: Payer: Self-pay

## 2014-12-11 MED ORDER — AMOXICILLIN-POT CLAVULANATE 875-125 MG PO TABS: 1.0000 | ORAL_TABLET | Freq: Two times a day (BID) | ORAL | Status: DC

## 2014-12-11 MED ORDER — METRONIDAZOLE 500 MG PO TABS: 500.0000 mg | ORAL_TABLET | Freq: Three times a day (TID) | ORAL | Status: DC

## 2014-12-11 NOTE — Telephone Encounter (Signed)
Resume antibiotics at the same dose

## 2014-12-11 NOTE — Telephone Encounter (Signed)
Patient agrees but is reluctant. Call in 1 week for progress update.

## 2014-12-11 NOTE — Telephone Encounter (Signed)
Finished Augmentin and Flagyl one week ago. Pain is back. Specifically he is having pain in the LLQ and tenderness if he push on the area. The pain woke him up at 2 am. His appointment with [facility] is 12/18/14. Please advise.

## 2014-12-16 NOTE — Telephone Encounter (Signed)
He will see surgeon this Friday. Patient asks if we need to do any imaging prior. He is showing improvement in that he has less pain now. I told him I think the surgeon will decide on needed imaging. It is doubtful I could get anything scheduled prior to Friday. But he wanted me to ask you. Please advise.

## 2014-12-16 NOTE — Telephone Encounter (Signed)
No need for repeat CT as long as he is improving.  Surgeon can decide.

## 2014-12-16 NOTE — Telephone Encounter (Signed)
Patient is advised of this. 

## 2014-12-18 ENCOUNTER — Telehealth: Payer: Self-pay | Admitting: Gastroenterology

## 2014-12-18 NOTE — Telephone Encounter (Signed)
I talked to him and explained its either antibiotics or surgery  He is going to call me back Tues/wed next week near end of Tx and we will see if he needs a longer or different Tx course - he understands that surgery may still be the answer to cure the problem

## 2014-12-18 NOTE — Telephone Encounter (Signed)
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 He has since had recurrent diverticulitis. Please see encounters for details. He saw Dr Dalbert Batman today in consultation to discuss possible colon resection. The patient asks are there any non-surgical options? He is concerned about being on atb's repeatedly. If he opts for the surgery, it will be more than a month away.

## 2014-12-21 NOTE — Telephone Encounter (Signed)
Spoke with the patient. He finishes the Augmentin today. He has 5 more days of the Flagyl. The atb's are causing GI issues, indigestion and nausea. Does he just stop the Fagyl today too?

## 2014-12-21 NOTE — Telephone Encounter (Signed)
Patient is advised. He will continue his probiotics.

## 2014-12-21 NOTE — Telephone Encounter (Signed)
Yes stop Flagyl and have him update in 3-4 days - sooner if worse  GI upset very common with the antibiotics  Starting a probiotic is reasonable but not absolutely necessary

## 2014-12-28 ENCOUNTER — Other Ambulatory Visit: Payer: Self-pay

## 2014-12-28 ENCOUNTER — Telehealth: Payer: Self-pay | Admitting: Gastroenterology

## 2014-12-28 ENCOUNTER — Other Ambulatory Visit (INDEPENDENT_AMBULATORY_CARE_PROVIDER_SITE_OTHER): Payer: Medicare Other

## 2014-12-28 DIAGNOSIS — R197 Diarrhea, unspecified: Secondary | ICD-10-CM

## 2014-12-28 LAB — BASIC METABOLIC PANEL
BUN: 16 mg/dL (ref 6–23)
CHLORIDE: 105 meq/L (ref 96–112)
CO2: 29 meq/L (ref 19–32)
Calcium: 8.9 mg/dL (ref 8.4–10.5)
Creatinine, Ser: 0.91 mg/dL (ref 0.40–1.50)
GFR: 85.46 mL/min (ref >60.00)
GLUCOSE: 116 mg/dL — AB (ref 70–99)
POTASSIUM: 3.7 meq/L (ref 3.5–5.1)
SODIUM: 141 meq/L (ref 135–145)

## 2014-12-28 LAB — CBC WITH DIFFERENTIAL/PLATELET
BASOS ABS: 0 10*3/uL (ref 0.0–0.1)
Basophils Relative: 0.6 % (ref 0.0–3.0)
EOS ABS: 0.1 10*3/uL (ref 0.0–0.7)
Eosinophils Relative: 2.7 % (ref 0.0–5.0)
HEMATOCRIT: 42.3 % (ref 39.0–52.0)
HEMOGLOBIN: 14.3 g/dL (ref 13.0–17.0)
LYMPHS PCT: 24.9 % (ref 12.0–46.0)
Lymphs Abs: 1.3 10*3/uL (ref 0.7–4.0)
MCHC: 33.7 g/dL (ref 30.0–36.0)
MCV: 90.5 fl (ref 78.0–100.0)
MONO ABS: 0.4 10*3/uL (ref 0.1–1.0)
Monocytes Relative: 8 % (ref 3.0–12.0)
Neutro Abs: 3.3 10*3/uL (ref 1.4–7.7)
Neutrophils Relative %: 63.8 % (ref 43.0–77.0)
Platelets: 171 10*3/uL (ref 150.0–400.0)
RBC: 4.68 Mil/uL (ref 4.22–5.81)
RDW: 15.3 % (ref 11.5–15.5)
WBC: 5.2 10*3/uL (ref 4.0–10.5)

## 2014-12-28 NOTE — Telephone Encounter (Signed)
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 He has since had recurrent diverticulitis. Please see encounters for details. Off antibiotics for diverticulitis since 12/21/14. He has continued probiotic. This weekend he developed significant abdominal pain middle and below umbilicus. He had fever which he treated with Ibuprofen. Bowel movements are loose, but not diarrhea. The intensity of the pain is a little less today.

## 2014-12-28 NOTE — Telephone Encounter (Signed)
Please have him do:  CBC, BMET C diff by PCR CT abd/pelvis with contrast re: recent diverticulitis ? Recurrence - has fever and periumbilical abdominal pain  Use diarrhea as dx for C diff PCR  Hopefully CT today or tomorrow at latest  If gets severe problems needs to head to ED

## 2014-12-28 NOTE — Telephone Encounter (Signed)
Patient notified and he is in agreement with this plan. He will come today for labs and to pick up his contrast with the instructions. Insurance is being notified. Ct tomorrow at 9:30 am.

## 2014-12-29 ENCOUNTER — Ambulatory Visit (INDEPENDENT_AMBULATORY_CARE_PROVIDER_SITE_OTHER)
Admission: RE | Admit: 2014-12-29 | Discharge: 2014-12-29 | Disposition: A | Discharge status: Home / Self Care | Payer: Medicare Other | Source: Ambulatory Visit | Attending: Internal Medicine | Admitting: Internal Medicine

## 2014-12-29 ENCOUNTER — Other Ambulatory Visit: Payer: Self-pay | Admitting: Internal Medicine

## 2014-12-29 ENCOUNTER — Encounter: Payer: Self-pay | Admitting: Internal Medicine

## 2014-12-29 DIAGNOSIS — R197 Diarrhea, unspecified: Secondary | ICD-10-CM | POA: Diagnosis not present

## 2014-12-29 DIAGNOSIS — R509 Fever, unspecified: Secondary | ICD-10-CM

## 2014-12-29 DIAGNOSIS — K5732 Diverticulitis of large intestine without perforation or abscess without bleeding: Secondary | ICD-10-CM

## 2014-12-29 DIAGNOSIS — R1033 Periumbilical pain: Secondary | ICD-10-CM | POA: Diagnosis not present

## 2014-12-29 HISTORY — DX: Diverticulitis of large intestine without perforation or abscess without bleeding: K57.32

## 2014-12-29 MED ORDER — AMOXICILLIN-POT CLAVULANATE 875-125 MG PO TABS: 1.0000 | ORAL_TABLET | Freq: Two times a day (BID) | ORAL | Status: AC

## 2014-12-29 MED ORDER — IOHEXOL 300 MG/ML  SOLN
100.0000 mL | Freq: Once | INTRAMUSCULAR | Status: AC | PRN
  Administered 2014-12-29: 100 mL via INTRAVENOUS

## 2014-12-29 NOTE — Progress Notes (Signed)
Quick Note:  Reviewed results He will go back to see Dr. Dalbert Batman  He has had some more pain episodes and fever 1x Another Tx w/ Augmentin ordered 875 mg bid x 2 weeks ______

## 2015-01-01 ENCOUNTER — Other Ambulatory Visit: Payer: Self-pay | Admitting: General Surgery

## 2015-01-14 ENCOUNTER — Other Ambulatory Visit: Payer: Self-pay | Admitting: General Surgery

## 2015-01-15 ENCOUNTER — Other Ambulatory Visit: Payer: Self-pay | Admitting: General Surgery

## 2015-01-15 DIAGNOSIS — K5909 Other constipation: Secondary | ICD-10-CM

## 2015-01-20 ENCOUNTER — Ambulatory Visit
Admission: RE | Admit: 2015-01-20 | Discharge: 2015-01-20 | Disposition: A | Discharge status: Home / Self Care | Payer: Medicare Other | Source: Ambulatory Visit | Attending: General Surgery | Admitting: General Surgery

## 2015-01-20 DIAGNOSIS — K5909 Other constipation: Secondary | ICD-10-CM

## 2015-01-26 ENCOUNTER — Other Ambulatory Visit: Payer: Self-pay | Admitting: General Surgery

## 2015-02-04 ENCOUNTER — Encounter: Payer: Self-pay | Admitting: Family Medicine

## 2015-02-04 ENCOUNTER — Other Ambulatory Visit: Payer: Self-pay | Admitting: Family Medicine

## 2015-02-04 NOTE — Telephone Encounter (Signed)
Medication refill for one time only.  Patient needs to be seen.  Letter sent for patient to call and schedule 

## 2015-02-15 ENCOUNTER — Other Ambulatory Visit: Payer: Self-pay | Admitting: Family Medicine

## 2015-02-15 NOTE — Telephone Encounter (Signed)
Medication refill for one time only on 02/04/15  Patient needs to be seen.  Letter was sent for patient to call and schedule

## 2015-02-16 ENCOUNTER — Other Ambulatory Visit: Payer: Self-pay | Admitting: Family Medicine

## 2015-02-16 MED ORDER — LOSARTAN POTASSIUM 100 MG PO TABS: 100.0000 mg | ORAL_TABLET | Freq: Every day | ORAL | Status: DC

## 2015-03-23 ENCOUNTER — Emergency Department (HOSPITAL_COMMUNITY): Payer: Medicare Other

## 2015-03-23 ENCOUNTER — Emergency Department (HOSPITAL_COMMUNITY)
Admission: EM | Admit: 2015-03-23 | Discharge: 2015-03-23 | Disposition: A | Discharge status: Home / Self Care | Payer: Medicare Other | Attending: Emergency Medicine | Admitting: Emergency Medicine

## 2015-03-23 ENCOUNTER — Encounter (HOSPITAL_COMMUNITY): Payer: Self-pay | Admitting: Emergency Medicine

## 2015-03-23 DIAGNOSIS — R079 Chest pain, unspecified: Secondary | ICD-10-CM | POA: Diagnosis present

## 2015-03-23 DIAGNOSIS — I10 Essential (primary) hypertension: Secondary | ICD-10-CM | POA: Insufficient documentation

## 2015-03-23 DIAGNOSIS — E785 Hyperlipidemia, unspecified: Secondary | ICD-10-CM | POA: Diagnosis not present

## 2015-03-23 DIAGNOSIS — R011 Cardiac murmur, unspecified: Secondary | ICD-10-CM | POA: Diagnosis not present

## 2015-03-23 DIAGNOSIS — Z87442 Personal history of urinary calculi: Secondary | ICD-10-CM | POA: Diagnosis not present

## 2015-03-23 DIAGNOSIS — Z86010 Personal history of colonic polyps: Secondary | ICD-10-CM | POA: Diagnosis not present

## 2015-03-23 DIAGNOSIS — Z85828 Personal history of other malignant neoplasm of skin: Secondary | ICD-10-CM | POA: Insufficient documentation

## 2015-03-23 DIAGNOSIS — Z8619 Personal history of other infectious and parasitic diseases: Secondary | ICD-10-CM | POA: Diagnosis not present

## 2015-03-23 DIAGNOSIS — M1990 Unspecified osteoarthritis, unspecified site: Secondary | ICD-10-CM | POA: Insufficient documentation

## 2015-03-23 DIAGNOSIS — R0789 Other chest pain: Secondary | ICD-10-CM | POA: Diagnosis not present

## 2015-03-23 DIAGNOSIS — Z79899 Other long term (current) drug therapy: Secondary | ICD-10-CM | POA: Insufficient documentation

## 2015-03-23 LAB — I-STAT TROPONIN, ED
TROPONIN I, POC: 0.01 ng/mL (ref 0.00–0.08)
Troponin i, poc: 0 ng/mL (ref 0.00–0.08)

## 2015-03-23 LAB — BASIC METABOLIC PANEL
ANION GAP: 8 (ref 5–15)
BUN: 12 mg/dL (ref 6–20)
CO2: 29 mmol/L (ref 22–32)
Calcium: 9.3 mg/dL (ref 8.9–10.3)
Chloride: 104 mmol/L (ref 101–111)
Creatinine, Ser: 0.86 mg/dL (ref 0.61–1.24)
GLUCOSE: 101 mg/dL — AB (ref 65–99)
POTASSIUM: 3.6 mmol/L (ref 3.5–5.1)
SODIUM: 141 mmol/L (ref 135–145)

## 2015-03-23 LAB — CBC WITH DIFFERENTIAL/PLATELET
BASOS ABS: 0.1 10*3/uL (ref 0.0–0.1)
Basophils Relative: 1 %
Eosinophils Absolute: 0.1 10*3/uL (ref 0.0–0.7)
Eosinophils Relative: 3 %
HEMATOCRIT: 43.1 % (ref 39.0–52.0)
HEMOGLOBIN: 14.9 g/dL (ref 13.0–17.0)
LYMPHS PCT: 27 %
Lymphs Abs: 1.2 10*3/uL (ref 0.7–4.0)
MCH: 30.7 pg (ref 26.0–34.0)
MCHC: 34.6 g/dL (ref 30.0–36.0)
MCV: 88.7 fL (ref 78.0–100.0)
Monocytes Absolute: 0.4 10*3/uL (ref 0.1–1.0)
Monocytes Relative: 9 %
NEUTROS ABS: 2.7 10*3/uL (ref 1.7–7.7)
NEUTROS PCT: 60 %
Platelets: 125 10*3/uL — ABNORMAL LOW (ref 150–400)
RBC: 4.86 MIL/uL (ref 4.22–5.81)
RDW: 14.2 % (ref 11.5–15.5)
WBC: 4.5 10*3/uL (ref 4.0–10.5)

## 2015-03-23 MED ORDER — ASPIRIN 81 MG PO CHEW: 324.0000 mg | CHEWABLE_TABLET | Freq: Once | ORAL | Status: DC

## 2015-03-23 NOTE — ED Notes (Signed)
Pt comes from urgent care with c/o intermittent sharp non-reproducable chest pain. Pt given PO 324mg  ASA. Vital signs stable. Pt A&OX4, NAD noted.

## 2015-03-23 NOTE — ED Provider Notes (Signed)
CSN: [id number]     Arrival date & time 03/23/15  1157 History   First MD Initiated Contact with Patient 03/23/15 1202     Chief Complaint  Patient presents with  . Chest Pain     (Consider location/radiation/quality/duration/timing/severity/associated sxs/prior Treatment) HPI  82 year old male presents with intermittent sharp left-sided chest pain. Started this morning when he first woke up at 3:30. He states it did not wake him up, he typically wakes up this early. The pain comes and goes. Lasts a maximum of 5 seconds at a time. It is sharp in nature. There is no radiation of the pain, diaphoresis, shortness of breath, or vomiting. Pain has been gone and not come back for 2 hours. No leg swelling or leg pain. Has a history of hypertension but denies family history of coronary disease, a personal history of hyperlipidemia, diabetes, or smoking. No known CAD.   Past Medical History  Diagnosis Date  . Adenomatous colon polyp 2001  . Diverticulosis of colon (without mention of hemorrhage) 2005  . Hypertension   . Hyperlipidemia   . Arthritis   . Shingles   . Pleurisy   . Prostatitis   . Retinal hemorrhage   . Heart murmur   . History of skin cancer   . GERD (gastroesophageal reflux disease)   . Hx of Clostridium difficile infection     3 MONTHS AGO  . History of kidney stones   . 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 Diverticulitis of colon - recurrent/persistent 12/29/2014   Past Surgical History  Procedure Laterality Date  . Lithotripsy    . Tonsillectomy    . Cataracts removed    . Cystoscopy with retrograde pyelogram, ureteroscopy and stent placement Right 10/18/2013    Procedure: CYSTOSCOPY WITH RIGHT RETROGRADE/RIGHT URETEROSCOPY, STONE BASKETRY,  AND STENT PLACEMENT RIGHT;  Surgeon: Alexis Frock, MD;  Location: [facility];  Service: Urology;  Laterality: Right;  . Colonoscopy    . Colonoscopy N/A 06/15/2014    Procedure: COLONOSCOPY;  Surgeon: Gatha Mayer, MD;   Location: [facility];  Service: Endoscopy;  Laterality: N/A;  . Fmt  06/2014   Family History  Problem Relation Age of Onset  . Colon cancer Father   . Colon polyps Father   . Breast cancer Mother   . Heart disease Mother   . Kidney disease Sister    Social History  Substance Use Topics  . Smoking status: Never Smoker   . Smokeless tobacco: Never Used  . Alcohol Use: No    Review of Systems  Constitutional: Negative for fever and diaphoresis.  Respiratory: Negative for shortness of breath.   Cardiovascular: Positive for chest pain. Negative for leg swelling.  Gastrointestinal: Negative for vomiting.  All other systems reviewed and are negative.     Allergies  Review of patient's allergies indicates no known allergies.  Home Medications   Prior to Admission medications   Medication Sig Start Date End Date Taking? Authorizing Provider  CALCIUM PO Take 1 tablet by mouth daily.    Historical Provider, MD  Cholecalciferol (VITAMIN D3) 2000 UNITS TABS Take 1 tablet by mouth daily.    Historical Provider, MD  Coenzyme Q10 (COQ10 PO) Take 100 mg by mouth daily.     Historical Provider, MD  fluticasone (FLONASE) 50 MCG/ACT nasal spray Place 1 spray into both nostrils daily as needed for allergies or rhinitis.    Historical Provider, MD  HYDROcodone-acetaminophen (NORCO/VICODIN) 5-325 MG per tablet Take 2 tablets  by mouth every 4 (four) hours as needed. Patient taking differently: Take 1 tablet by mouth every 6 (six) hours as needed for moderate pain.  10/31/14   Leonard Schwartz, MD  hydroxypropyl methylcellulose / hypromellose (ISOPTO TEARS / GONIOVISC) 2.5 % ophthalmic solution Place 1 drop into both eyes 3 (three) times daily as needed for dry eyes.    Historical Provider, MD  ibuprofen (ADVIL,MOTRIN) 200 MG tablet Take 200-400 mg by mouth every 6 (six) hours as needed for moderate pain.     Historical Provider, MD  KRILL OIL PO Take 2 tablets by mouth daily.    Historical  Provider, MD  losartan (COZAAR) 100 MG tablet Take 1 tablet (100 mg total) by mouth daily. 02/16/15   Susy Frizzle, MD  LUTEIN PO Take 25 mg by mouth daily.     Historical Provider, MD  Multiple Vitamins-Minerals (EYE VITAMINS PO) Take 1 tablet by mouth daily.    Historical Provider, MD  saccharomyces boulardii (FLORASTOR) 250 MG capsule Take 1 capsule (250 mg total) by mouth 2 (two) times daily. Patient taking differently: Take 250 mg by mouth daily.  11/04/14   Amy S Esterwood, PA-C  traMADol (ULTRAM) 50 MG tablet Take 50 mg by mouth every 6 (six) hours as needed for moderate pain.     Historical Provider, MD   BP 159/90 mmHg  Pulse 71  Temp(Src) 98.2 F (36.8 C) (Oral)  Resp 14  Ht 5\' 11"  (1.803 m)  Wt 168 lb (76.204 kg)  BMI 23.44 kg/m2  SpO2 97% Physical Exam  Constitutional: He is oriented to person, place, and time. He appears well-developed and well-nourished. No distress.  HENT:  Head: Normocephalic and atraumatic.  Right Ear: External ear normal.  Left Ear: External ear normal.  Nose: Nose normal.  Eyes: Right eye exhibits no discharge. Left eye exhibits no discharge.  Neck: Neck supple.  Cardiovascular: Normal rate, regular rhythm and intact distal pulses.   Murmur heard. Pulmonary/Chest: Effort normal and breath sounds normal. He exhibits no tenderness.  Abdominal: Soft. There is no tenderness.  Musculoskeletal: He exhibits no edema.  Neurological: He is alert and oriented to person, place, and time.  Skin: Skin is warm and dry. He is not diaphoretic.  Nursing note and vitals reviewed.   ED Course  Procedures (including critical care time) Labs Review Labs Reviewed  BASIC METABOLIC PANEL - Abnormal; Notable for the following:    Glucose, Bld 101 (*)    All other components within normal limits  CBC WITH DIFFERENTIAL/PLATELET - Abnormal; Notable for the following:    Platelets 125 (*)    All other components within normal limits  I-STAT TROPOININ, ED     Imaging Review Dg Chest 2 View  03/23/2015  CLINICAL DATA:  Intermittent sharp non reproducible chest pain. History of hypertension. EXAM: CHEST  2 VIEW COMPARISON:  10/17/2013. FINDINGS: The heart size and mediastinal contours are within normal limits. Both lungs are clear. The visualized skeletal structures are unremarkable. IMPRESSION: No active cardiopulmonary disease.  No change from priors. Electronically Signed   By: Staci Righter M.D.   On: 03/23/2015 13:02   I have personally reviewed and evaluated these images and lab results as part of my medical decision-making.   EKG Interpretation   Date/Time:  Tuesday March 23 2015 11:59:10 EST Ventricular Rate:  69 PR Interval:  165 QRS Duration: 98 QT Interval:  397 QTC Calculation: 425 R Axis:   3 Text Interpretation:  Sinus rhythm Abnormal  R-wave progression, early  transition Minimal ST depression, lateral leads nonspecific ST changes new  compared to 2015 Confirmed by Jarrah Seher  MD, Broghan Pannone ([id number]) on 03/23/2015  12:02:17 PM      MDM   Final diagnoses:  Atypical chest pain    Patient with very atypical left-sided chest pain. Patient is pain-free and has been pain-free for several hours. His risk factors include age and hypertension. Initial EKG is nonspecific with possible minimal ST depressions but repeat is normal. Discussed results with patient and through shared decision making he wants to go home with close f/u with PCP. Discussed risks/benefits of observation vs close outpatient follow up. Discussed strict return precautions. 2nd troponin pending, care transferred to Dr. Maryan Rued with plan to d/c if no elevation.    Sherwood Gambler, MD 03/23/15 [phone]

## 2015-03-24 ENCOUNTER — Telehealth: Payer: Self-pay | Admitting: Family Medicine

## 2015-03-24 NOTE — Telephone Encounter (Signed)
Patient calling to talk about his trip to the er and his heart issues , would like a call back at [phone]

## 2015-03-29 ENCOUNTER — Ambulatory Visit: Payer: Medicare Other | Admitting: Family Medicine

## 2015-03-31 ENCOUNTER — Ambulatory Visit (INDEPENDENT_AMBULATORY_CARE_PROVIDER_SITE_OTHER): Payer: Medicare Other | Admitting: Cardiology

## 2015-03-31 VITALS — BP 150/78 | HR 73 | Ht 71.0 in | Wt 174.8 lb

## 2015-03-31 DIAGNOSIS — R072 Precordial pain: Secondary | ICD-10-CM

## 2015-03-31 DIAGNOSIS — I119 Hypertensive heart disease without heart failure: Secondary | ICD-10-CM | POA: Diagnosis not present

## 2015-03-31 DIAGNOSIS — R079 Chest pain, unspecified: Secondary | ICD-10-CM

## 2015-03-31 DIAGNOSIS — Z01818 Encounter for other preprocedural examination: Secondary | ICD-10-CM | POA: Diagnosis not present

## 2015-03-31 HISTORY — DX: Chest pain, unspecified: R07.9

## 2015-03-31 NOTE — Progress Notes (Signed)
Patient ID: Joel Bauer, male   DOB: 1934-01-24, 82 y.o.   MRN: [id number]      Cardiology Office Note - Consultation  Date:  03/31/2015   ID:  Joel Bauer, DOB Sep 05, 1933, MRN [id number]  PCP:  Odette Fraction, MD  Cardiologist:   Dorothy Spark, MD  Referring physician: Fanny Skates, MD  Chief Complaint  Patient presents with  . New Evaluation    c/o chest pain  denies shortness of breath and le edema   Chief complain: chest pain, preoperative evaluation   History of Present Illness: Joel Bauer is a 82 y.o. male who presents for evaluation of chest pain. The patient has h/o hypertension, hyperlipidemia and chronic diverticulitis, C.diff in the past, scheduled for a laparoscopic assisted low anterior resection this Friday 04/02/2015. He presented to the ER on 03/22/2015 with sudden onset chest pain, that woke him up at night and was sharp. He had a similar episode in the past. He is otherwise very active, playing tennis several times a week and hasn't noticed any chest pain, DOE, palpitaions, syncope or claudications.  His mother had CHF in her 31', died at age 62. No other family members being diagnosed with heart problems.   Past Medical History  Diagnosis Date  . Adenomatous colon polyp 2001  . Diverticulosis of colon (without mention of hemorrhage) 2005  . Hypertension   . Hyperlipidemia   . Arthritis   . Shingles   . Pleurisy   . Prostatitis   . Retinal hemorrhage   . Heart murmur   . History of skin cancer   . GERD (gastroesophageal reflux disease)   . Hx of Clostridium difficile infection     3 MONTHS AGO  . History of kidney stones   . 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 Diverticulitis of colon - recurrent/persistent 12/29/2014    Past Surgical History  Procedure Laterality Date  . Lithotripsy    . Tonsillectomy    . Cataracts removed    . Cystoscopy with retrograde pyelogram, ureteroscopy and stent placement Right 10/18/2013      Procedure: CYSTOSCOPY WITH RIGHT RETROGRADE/RIGHT URETEROSCOPY, STONE BASKETRY,  AND STENT PLACEMENT RIGHT;  Surgeon: Alexis Frock, MD;  Location: [facility];  Service: Urology;  Laterality: Right;  . Colonoscopy    . Colonoscopy N/A 06/15/2014    Procedure: COLONOSCOPY;  Surgeon: Gatha Mayer, MD;  Location: [place];  Service: Endoscopy;  Laterality: N/A;  . Fmt  06/2014     Current Outpatient Prescriptions  Medication Sig Dispense Refill  . CALCIUM PO Take 1 tablet by mouth daily.    . Cholecalciferol (VITAMIN D3) 2000 UNITS TABS Take 1 tablet by mouth daily.    . Coenzyme Q10 (COQ10 PO) Take 100 mg by mouth daily.     . fluticasone (FLONASE) 50 MCG/ACT nasal spray Place 1 spray into both nostrils daily as needed for allergies or rhinitis.    Marland Kitchen HYDROcodone-acetaminophen (NORCO/VICODIN) 5-325 MG tablet Take 1 tablet by mouth every 6 (six) hours as needed for moderate pain.    . hydroxypropyl methylcellulose / hypromellose (ISOPTO TEARS / GONIOVISC) 2.5 % ophthalmic solution Place 1 drop into both eyes 3 (three) times daily as needed for dry eyes.    Marland Kitchen ibuprofen (ADVIL,MOTRIN) 200 MG tablet Take 200-400 mg by mouth every 6 (six) hours as needed for moderate pain.     Marland Kitchen KRILL OIL PO Take 2 tablets by mouth daily.    Marland Kitchen losartan (  COZAAR) 100 MG tablet Take 1 tablet (100 mg total) by mouth daily. 90 tablet 1  . LUTEIN PO Take 25 mg by mouth daily.     . Multiple Vitamins-Minerals (EYE VITAMINS PO) Take 1 tablet by mouth daily.    Marland Kitchen saccharomyces boulardii (FLORASTOR) 250 MG capsule Take 250 mg by mouth daily.    . traMADol (ULTRAM) 50 MG tablet Take 50 mg by mouth every 6 (six) hours as needed for moderate pain.      No current facility-administered medications for this visit.    Allergies:   Review of patient's allergies indicates no known allergies.    Social History:  The patient  reports that he has never smoked. He has never used smokeless tobacco. He reports that he does not  drink alcohol or use illicit drugs.   Family History:  The patient's family history includes Breast cancer in his mother; Colon cancer in his father; Colon polyps in his father; Heart disease in his mother; Kidney disease in his sister.    ROS:  Please see the history of present illness.   Otherwise, review of systems are positive for none.   All other systems are reviewed and negative.    PHYSICAL EXAM: VS:  BP 150/78 mmHg  Pulse 73  Ht 5\' 11"  (1.803 m)  Wt 174 lb 12.8 oz (79.289 kg)  BMI 24.39 kg/m2 , BMI Body mass index is 24.39 kg/(m^2). GEN: Well nourished, well developed, in no acute distress HEENT: normal Neck: no JVD, carotid bruits, or masses Cardiac: RRR; 2/6 systolic murmur, rubs, or gallops,no edema  Respiratory:  clear to auscultation bilaterally, normal work of breathing GI: soft, nontender, nondistended, + BS MS: no deformity or atrophy Skin: warm and dry, no rash Neuro:  Strength and sensation are intact Psych: euthymic mood, full affect  EKG:  EKG is ordered today. The ekg ordered today demonstrates R, normal ECG  Recent Labs: 10/31/2014: ALT 30 03/23/2015: BUN 12; Creatinine, Ser 0.86; Hemoglobin 14.9; Platelets 125*; Potassium 3.6; Sodium 141   Lipid Panel No results found for: CHOL, TRIG, HDL, CHOLHDL, VLDL, LDLCALC, LDLDIRECT   Wt Readings from Last 3 Encounters:  03/31/15 174 lb 12.8 oz (79.289 kg)  03/23/15 168 lb (76.204 kg)  11/26/14 168 lb 8 oz (76.431 kg)      ASSESSMENT AND PLAN:  1.  Chest pain - atypical, however has risk factors - including age, sex, HTN, HLP and has never has a stress test in the past, we will order.   2. Hypertension - today and in the ER, possibly sec to white coat, we will reassess at rest and during stress at the stress test, also assess LVH on echo and decide.  3. Preop evaluation - if normal stress test the patient should have no contraindication to the scheduled laparoscopic surgery.  Orders Placed This Encounter    Procedures  . Myocardial Perfusion Imaging  . EKG 12-Lead  . Echocardiogram   Follow up in 3 months.  Signed, Dorothy Spark, MD  03/31/2015 12:28 PM    [facility] [address], [place], [place]  [address] Phone: [phone]; Fax: [phone]

## 2015-03-31 NOTE — Patient Instructions (Signed)
Medication Instructions:   Your physician recommends that you continue on your current medications as directed. Please refer to the Current Medication list given to you today.    Testing/Procedures:  Your physician has requested that you have an echocardiogram. Echocardiography is a painless test that uses sound waves to create images of your heart. It provides your doctor with information about the size and shape of your heart and how well your heart's chambers and valves are working. This procedure takes approximately one hour. There are no restrictions for this procedure.   Your physician has requested that you have en exercise stress myoview. For further information please visit [email or web address]. Please follow instruction sheet, as given.    Follow-Up:  2 MONTHS WITH DR Meda Coffee     If you need a refill on your cardiac medications before your next appointment, please call your pharmacy.

## 2015-04-07 ENCOUNTER — Telehealth (HOSPITAL_COMMUNITY): Payer: Self-pay | Admitting: *Deleted

## 2015-04-07 NOTE — Telephone Encounter (Signed)
Left message on voicemail in reference to upcoming appointment scheduled for 04/12/15 . Phone number given for a call back so details instructions can be given. Hubbard Robinson, RN

## 2015-04-12 ENCOUNTER — Encounter: Payer: Self-pay | Admitting: *Deleted

## 2015-04-12 ENCOUNTER — Ambulatory Visit (HOSPITAL_BASED_OUTPATIENT_CLINIC_OR_DEPARTMENT_OTHER): Payer: Medicare Other

## 2015-04-12 ENCOUNTER — Telehealth: Payer: Self-pay | Admitting: *Deleted

## 2015-04-12 ENCOUNTER — Other Ambulatory Visit: Payer: Self-pay

## 2015-04-12 ENCOUNTER — Ambulatory Visit (HOSPITAL_COMMUNITY): Payer: Medicare Other | Attending: Cardiology

## 2015-04-12 DIAGNOSIS — I340 Nonrheumatic mitral (valve) insufficiency: Secondary | ICD-10-CM | POA: Diagnosis not present

## 2015-04-12 DIAGNOSIS — E785 Hyperlipidemia, unspecified: Secondary | ICD-10-CM | POA: Insufficient documentation

## 2015-04-12 DIAGNOSIS — I700 Atherosclerosis of aorta: Secondary | ICD-10-CM | POA: Insufficient documentation

## 2015-04-12 DIAGNOSIS — I517 Cardiomegaly: Secondary | ICD-10-CM | POA: Insufficient documentation

## 2015-04-12 DIAGNOSIS — I5189 Other ill-defined heart diseases: Secondary | ICD-10-CM | POA: Diagnosis not present

## 2015-04-12 DIAGNOSIS — I071 Rheumatic tricuspid insufficiency: Secondary | ICD-10-CM | POA: Diagnosis not present

## 2015-04-12 DIAGNOSIS — Z8249 Family history of ischemic heart disease and other diseases of the circulatory system: Secondary | ICD-10-CM | POA: Insufficient documentation

## 2015-04-12 DIAGNOSIS — Z01818 Encounter for other preprocedural examination: Secondary | ICD-10-CM | POA: Diagnosis not present

## 2015-04-12 DIAGNOSIS — I10 Essential (primary) hypertension: Secondary | ICD-10-CM | POA: Diagnosis not present

## 2015-04-12 DIAGNOSIS — R072 Precordial pain: Secondary | ICD-10-CM

## 2015-04-12 DIAGNOSIS — R002 Palpitations: Secondary | ICD-10-CM | POA: Insufficient documentation

## 2015-04-12 DIAGNOSIS — I351 Nonrheumatic aortic (valve) insufficiency: Secondary | ICD-10-CM | POA: Diagnosis not present

## 2015-04-12 LAB — MYOCARDIAL PERFUSION IMAGING
Estimated workload: 7 METS
Exercise duration (min): 6 min
Exercise duration (sec): 45 s
LV dias vol: 92 mL
LV sys vol: 36 mL
MPHR: 141 {beats}/min
Peak HR: 139 {beats}/min
Percent HR: 98 %
RATE: 0.32
RPE: 17
Rest HR: 60 {beats}/min
SDS: 1
SRS: 3
SSS: 4
TID: 0.98

## 2015-04-12 MED ORDER — AMLODIPINE BESYLATE 2.5 MG PO TABS: 2.5000 mg | ORAL_TABLET | Freq: Every day | ORAL | Status: DC

## 2015-04-12 MED ORDER — TECHNETIUM TC 99M SESTAMIBI GENERIC - CARDIOLITE
32.7000 | Freq: Once | INTRAVENOUS | Status: AC | PRN
  Administered 2015-04-12: 32.7 via INTRAVENOUS

## 2015-04-12 MED ORDER — TECHNETIUM TC 99M SESTAMIBI GENERIC - CARDIOLITE
11.0000 | Freq: Once | INTRAVENOUS | Status: AC | PRN
  Administered 2015-04-12: 11 via INTRAVENOUS

## 2015-04-12 NOTE — Telephone Encounter (Signed)
Notified the pt that per Dr Meda Coffee, his LVEF was normal, stress test is still pending, but hypertensive response to stress and LVH on echo.  Informed the pt that per Dr Meda Coffee, she recommends that we add amlodipine 2.5 mg po daily and follow-up with him in the clinic in 2 months.  Confirmed the pharmacy of choice with the pt. Pt is already scheduled to see Dr Meda Coffee in the clinic on 06/10/15 at [place].  Pt aware of this date and time.  Pt verbalized understanding and agrees with this plan.

## 2015-04-12 NOTE — Telephone Encounter (Signed)
-----   Message from Dorothy Spark, MD sent at 04/12/2015  3:16 PM EST ----- LVEF normal, stress test still pending, but hypertensive response to stress and LVH on echo. Please add amlodipine 2.5 mg po daily and follow up in 2 months.

## 2015-04-13 NOTE — Patient Instructions (Addendum)
DYONTE COSGROVE  04/13/2015   Your procedure is scheduled on: 04/19/2015    Report to [facility] Main  Entrance take [facility]  elevators to 3rd floor to  [place] at   0900 AM.  Call this number if you have problems the morning of surgery [phone]   Remember: ONLY 1 PERSON MAY GO WITH YOU TO SHORT STAY TO GET  READY MORNING OF YOUR SURGERY.  Do not eat food or drink liquids :After Midnight.     Take these medicines the morning of surgery with A SIP OF WATER: , Flonase if needed, Hydrocodone if needed, Isopto tears if needed                                 You may not have any metal on your body including hair pins and              piercings  Do not wear jewelry,  lotions, powders or perfumes, deodorant                       Men may shave face and neck.   Do not bring valuables to the hospital. [place].  Contacts, dentures or bridgework may not be worn into surgery.  Leave suitcase in the car. After surgery it may be brought to your room.        Special Instructions: coughing and deep breathing exercises, leg exercises               Please read over the following fact sheets you were given: _____________________________________________________________________             [facility] - Preparing for Surgery Before surgery, you can play an important role.  Because skin is not sterile, your skin needs to be as free of germs as possible.  You can reduce the number of germs on your skin by washing with CHG (chlorahexidine gluconate) soap before surgery.  CHG is an antiseptic cleaner which kills germs and bonds with the skin to continue killing germs even after washing. Please DO NOT use if you have an allergy to CHG or antibacterial soaps.  If your skin becomes reddened/irritated stop using the CHG and inform your nurse when you arrive at Short Stay. Do not shave (including legs and underarms) for at  least 48 hours prior to the first CHG shower.  You may shave your face/neck. Please follow these instructions carefully:  1.  Shower with CHG Soap the night before surgery and the  morning of Surgery.  2.  If you choose to wash your hair, wash your hair first as usual with your  normal  shampoo.  3.  After you shampoo, rinse your hair and body thoroughly to remove the  shampoo.                           4.  Use CHG as you would any other liquid soap.  You can apply chg directly  to the skin and wash                       Gently with a scrungie  or clean washcloth.  5.  Apply the CHG Soap to your body ONLY FROM THE NECK DOWN.   Do not use on face/ open                           Wound or open sores. Avoid contact with eyes, ears mouth and genitals (private parts).                       Wash face,  Genitals (private parts) with your normal soap.             6.  Wash thoroughly, paying special attention to the area where your surgery  will be performed.  7.  Thoroughly rinse your body with warm water from the neck down.  8.  DO NOT shower/wash with your normal soap after using and rinsing off  the CHG Soap.                9.  Pat yourself dry with a clean towel.            10.  Wear clean pajamas.            11.  Place clean sheets on your bed the night of your first shower and do not  sleep with pets. Day of Surgery : Do not apply any lotions/deodorants the morning of surgery.  Please wear clean clothes to the hospital/surgery center.  FAILURE TO FOLLOW THESE INSTRUCTIONS MAY RESULT IN THE CANCELLATION OF YOUR SURGERY PATIENT SIGNATURE_________________________________  NURSE SIGNATURE__________________________________  ________________________________________________________________________  WHAT IS A BLOOD TRANSFUSION? Blood Transfusion Information  A transfusion is the replacement of blood or some of its parts. Blood is made up of multiple cells which provide different functions.  Red  blood cells carry oxygen and are used for blood loss replacement.  White blood cells fight against infection.  Platelets control bleeding.  Plasma helps clot blood.  Other blood products are available for specialized needs, such as hemophilia or other clotting disorders. BEFORE THE TRANSFUSION  Who gives blood for transfusions?   Healthy volunteers who are fully evaluated to make sure their blood is safe. This is blood bank blood. Transfusion therapy is the safest it has ever been in the practice of medicine. Before blood is taken from a donor, a complete history is taken to make sure that person has no history of diseases nor engages in risky social behavior (examples are intravenous drug use or sexual activity with multiple partners). The donor's travel history is screened to minimize risk of transmitting infections, such as malaria. The donated blood is tested for signs of infectious diseases, such as HIV and hepatitis. The blood is then tested to be sure it is compatible with you in order to minimize the chance of a transfusion reaction. If you or a relative donates blood, this is often done in anticipation of surgery and is not appropriate for emergency situations. It takes many days to process the donated blood. RISKS AND COMPLICATIONS Although transfusion therapy is very safe and saves many lives, the main dangers of transfusion include:   Getting an infectious disease.  Developing a transfusion reaction. This is an allergic reaction to something in the blood you were given. Every precaution is taken to prevent this. The decision to have a blood transfusion has been considered carefully by your caregiver before blood is given. Blood is not given unless the benefits outweigh the risks. AFTER THE  TRANSFUSION  Right after receiving a blood transfusion, you will usually feel much better and more energetic. This is especially true if your red blood cells have gotten low (anemic). The  transfusion raises the level of the red blood cells which carry oxygen, and this usually causes an energy increase.  The nurse administering the transfusion will monitor you carefully for complications. HOME CARE INSTRUCTIONS  No special instructions are needed after a transfusion. You may find your energy is better. Speak with your caregiver about any limitations on activity for underlying diseases you may have. SEEK MEDICAL CARE IF:   Your condition is not improving after your transfusion.  You develop redness or irritation at the intravenous (IV) site. SEEK IMMEDIATE MEDICAL CARE IF:  Any of the following symptoms occur over the next 12 hours:  Shaking chills.  You have a temperature by mouth above 102 F (38.9 C), not controlled by medicine.  Chest, back, or muscle pain.  People around you feel you are not acting correctly or are confused.  Shortness of breath or difficulty breathing.  Dizziness and fainting.  You get a rash or develop hives.  You have a decrease in urine output.  Your urine turns a dark color or changes to pink, red, or brown. Any of the following symptoms occur over the next 10 days:  You have a temperature by mouth above 102 F (38.9 C), not controlled by medicine.  Shortness of breath.  Weakness after normal activity.  The white part of the eye turns yellow (jaundice).  You have a decrease in the amount of urine or are urinating less often.  Your urine turns a dark color or changes to pink, red, or brown. Document Released: 03/03/2000 Document Revised: 05/29/2011 Document Reviewed: 10/21/2007 [facility] Patient Information 2014 [place], [place].  _______________________________________________________________________

## 2015-04-14 ENCOUNTER — Encounter (HOSPITAL_COMMUNITY): Payer: Self-pay

## 2015-04-14 ENCOUNTER — Encounter (HOSPITAL_COMMUNITY)
Admission: RE | Admit: 2015-04-14 | Discharge: 2015-04-14 | Disposition: A | Discharge status: Home / Self Care | Payer: Medicare Other | Source: Ambulatory Visit | Attending: General Surgery | Admitting: General Surgery

## 2015-04-14 DIAGNOSIS — Z01812 Encounter for preprocedural laboratory examination: Secondary | ICD-10-CM | POA: Insufficient documentation

## 2015-04-14 HISTORY — DX: Anemia, unspecified: D64.9

## 2015-04-14 HISTORY — DX: Malignant (primary) neoplasm, unspecified: C80.1

## 2015-04-14 HISTORY — DX: Unspecified macular degeneration: H35.30

## 2015-04-14 LAB — CBC WITH DIFFERENTIAL/PLATELET
BASOS ABS: 0 10*3/uL (ref 0.0–0.1)
BASOS PCT: 1 %
EOS ABS: 0.1 10*3/uL (ref 0.0–0.7)
Eosinophils Relative: 3 %
HCT: 39.9 % (ref 39.0–52.0)
HEMOGLOBIN: 13.2 g/dL (ref 13.0–17.0)
Lymphocytes Relative: 28 %
Lymphs Abs: 1.4 10*3/uL (ref 0.7–4.0)
MCH: 30.1 pg (ref 26.0–34.0)
MCHC: 33.1 g/dL (ref 30.0–36.0)
MCV: 91.1 fL (ref 78.0–100.0)
MONOS PCT: 6 %
Monocytes Absolute: 0.3 10*3/uL (ref 0.1–1.0)
NEUTROS PCT: 62 %
Neutro Abs: 3 10*3/uL (ref 1.7–7.7)
Platelets: 162 10*3/uL (ref 150–400)
RBC: 4.38 MIL/uL (ref 4.22–5.81)
RDW: 14.3 % (ref 11.5–15.5)
WBC: 4.8 10*3/uL (ref 4.0–10.5)

## 2015-04-14 LAB — COMPREHENSIVE METABOLIC PANEL
ALBUMIN: 3.9 g/dL (ref 3.5–5.0)
ALT: 26 U/L (ref 17–63)
ANION GAP: 10 (ref 5–15)
AST: 33 U/L (ref 15–41)
Alkaline Phosphatase: 61 U/L (ref 38–126)
BUN: 16 mg/dL (ref 6–20)
CO2: 25 mmol/L (ref 22–32)
Calcium: 9.1 mg/dL (ref 8.9–10.3)
Chloride: 108 mmol/L (ref 101–111)
Creatinine, Ser: 1.05 mg/dL (ref 0.61–1.24)
GFR calc Af Amer: >60 mL/min (ref >60)
GFR calc non Af Amer: >60 mL/min (ref >60)
GLUCOSE: 114 mg/dL — AB (ref 65–99)
POTASSIUM: 3.9 mmol/L (ref 3.5–5.1)
SODIUM: 143 mmol/L (ref 135–145)
Total Bilirubin: 0.8 mg/dL (ref 0.3–1.2)
Total Protein: 6.2 g/dL — ABNORMAL LOW (ref 6.5–8.1)

## 2015-04-14 LAB — ABO/RH: ABO/RH(D): O POS

## 2015-04-14 NOTE — Progress Notes (Signed)
2V CXR- 03/23/15-EPIC  EKG- 03/31/15-EPIC  04/12/15- Stress Test and ECHo- EPIC  03/31/15- LOV- Cardiology - EPIC

## 2015-04-15 LAB — HEMOGLOBIN A1C
Hgb A1c MFr Bld: 5.9 % — ABNORMAL HIGH (ref 4.8–5.6)
MEAN PLASMA GLUCOSE: 123 mg/dL

## 2015-04-15 NOTE — Progress Notes (Signed)
HGA1C done 04/14/15 faxed via EPIC to Dr Dalbert Batman.

## 2015-04-16 NOTE — H&P (Signed)
Joel Bauer [facility]   Location: [facility] Patient #: [id number] DOB: 05-Apr-1933 Widowed / Language: Joel Bauer / Race: White Male   History of Present Illness  The patient is a 82 year old male who presents with diverticulitis. 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 He is feeling pretty good but has sort of a low-grade background lower abdominal discomfort. Appetite normal. Bowel movements soft. No diarrhea.  Single column barium enema was performed on January 20, 2015. This shows severe rectosigmoid diverticulosis and limited assessment of that region. Some mucosal edema. They could not rule out neoplasm because of the severity of the diverticulosis. The rest of the colon looked fine. I told him we were not very concerned about neoplasia since Dr. Carlean Bauer had performed colonoscopy 6 months ago.  His diverticulitis goes back to December 28, 2012 at which time a CT scan showed uncomplicated diverticulitis. He's been treated off and on since then. 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 Noralyn Bauer about 6 months ago. The C. difficile colitis has not recurred. He's had a couple of episodes of mild diverticulitis since then that have been treated as an outpatient.  Comorbidities include the C. difficile colitis, benign hypertension, benign prostatic hypertrophy, chronic constipation. Otherwise fairly healthy.  He will be scheduled for laparoscopic-assisted sigmoid colectomy, possible open colectomy in January. I discussed the indications, details, techniques, and numerous risk of the surgery with him. He is aware of the risk of bleeding, infection, conversion to open laparotomy, injury to adjacent organs requiring major reconstructive surgery, anastomotic leak requiring reoperation and colostomy,  cardiac pulmonary and thromboembolic problems. He is aware of wound healing problems such as hernia. He understands all these issues well. All of his questions were answered. He agrees with this plan.  I discussed the bowel prep with him and given him the mechanical and antibiotic bowel prep.   Allergies  No Known Drug Allergies09/30/2016   Florastor (250MG  Capsule, Oral) Active. Losartan Potassium (100MG  Tablet, Oral) Active. Medications Reconciled  Vitals   Weight: 172.2 lb Height: 71in Body Surface Area: 1.98 m Body Mass Index: 24.02 kg/m  Temp.: 48F(Temporal)  Pulse: 73 (Regular)  BP: 128/80 (Sitting, Left Arm, Standard)       Physical Exam  General Mental Status-Alert. General Appearance-Not in acute distress. Build & Nutrition-Well nourished. Posture-Normal posture. Gait-Normal.  Head and Neck Head-normocephalic, atraumatic with no lesions or palpable masses. Trachea-midline. Thyroid Gland Characteristics - normal size and consistency and no palpable nodules.  Chest and Lung Exam Chest and lung exam reveals -on auscultation, normal breath sounds, no adventitious sounds and normal vocal resonance.  Cardiovascular Cardiovascular examination reveals -normal heart sounds, regular rate and rhythm with no murmurs and femoral artery auscultation bilaterally reveals normal pulses, no bruits, no thrills.  Abdomen Inspection Inspection of the abdomen reveals - No Hernias. Palpation/Percussion Palpation and Percussion of the abdomen reveal - Soft, Non Tender, No Rigidity (guarding), No hepatosplenomegaly and No Palpable abdominal masses. Note: Abdominal exam is benign objectivly. He does report some discomfort to deep palpation in the lower abdomen. No mass.   Neurologic Neurologic evaluation reveals -alert and oriented x 3 with no impairment of recent or remote memory, normal attention span and ability to concentrate,  normal sensation and normal coordination.  Musculoskeletal Normal Exam - Bilateral-Upper Extremity Strength Normal and Lower Extremity Strength  Normal.    Assessment & Plan  DIVERTICULITIS, COLON (K57.32) Current Plans Started Neomycin Sulfate 500MG , 2 (two) Tablet SEE NOTE, #6, 01/26/2015, No Refill. Local Order: TAKE TWO TABLETS AT 2 PM, 3 PM, AND 10 PM THE DAY PRIOR TO SURGERY Started Flagyl 500MG , 2 (two) Tablet SEE NOTE, #6, 01/26/2015, No Refill. Local Order: Take at 2pm, 3pm, and 10pm the day prior to your colon operation Pt Education - Pamphlet Given - Laparoscopic Colorectal Surgery: discussed with patient and provided information. You are being scheduled for surgery - Our schedulers will call you. You have decided to go ahead with elective sigmoid colectomy. You states that you wish do the surgery in January. Your barium enema shows that you have lots of diverticula concentrated in the sigmoid colon in the left lower quadrant, and that will be the segment of colon that will be removed. You will be scheduled for elective laparoscopic-assisted sigmoid colectomy. You will need to undergo a bowel prep 24 hours prior to surgery. This includes antibiotic pills that we have called into her pharmacy and a bowel prep that my nurse will discuss with you. Be sure to do the bowel prep We have discussed the techniques and numerous risk of this surgery in detail. Please read all of the printed information that we have given you.     CONSTIPATION, CHRONIC (K59.09) BENIGN NODULAR PROSTATIC HYPERPLASIA WITHOUT LOWER URINARY TRACT SYMPTOMS (N40.0) HYPERTENSION, BENIGN (I10) C. DIFFICILE COLITIS (A04.7)     Signed by Adin Hector, MD (01/26/2015 10:51 AM.hmis

## 2015-04-19 ENCOUNTER — Encounter (HOSPITAL_COMMUNITY)
Admission: RE | Disposition: A | Discharge status: Home / Self Care | Payer: Self-pay | Source: Ambulatory Visit | Attending: General Surgery

## 2015-04-19 ENCOUNTER — Inpatient Hospital Stay (HOSPITAL_COMMUNITY): Payer: Medicare Other | Admitting: Anesthesiology

## 2015-04-19 ENCOUNTER — Inpatient Hospital Stay (HOSPITAL_COMMUNITY)
Admission: RE | Admit: 2015-04-19 | Discharge: 2015-04-27 | DRG: 330 | Disposition: A | Discharge status: Home / Self Care | Payer: Medicare Other | Source: Ambulatory Visit | Attending: General Surgery | Admitting: General Surgery

## 2015-04-19 ENCOUNTER — Encounter (HOSPITAL_COMMUNITY): Payer: Self-pay | Admitting: Certified Registered Nurse Anesthetist

## 2015-04-19 DIAGNOSIS — K9189 Other postprocedural complications and disorders of digestive system: Secondary | ICD-10-CM

## 2015-04-19 DIAGNOSIS — N400 Enlarged prostate without lower urinary tract symptoms: Secondary | ICD-10-CM | POA: Diagnosis present

## 2015-04-19 DIAGNOSIS — R1032 Left lower quadrant pain: Secondary | ICD-10-CM | POA: Diagnosis present

## 2015-04-19 DIAGNOSIS — R110 Nausea: Secondary | ICD-10-CM

## 2015-04-19 DIAGNOSIS — T8859XA Other complications of anesthesia, initial encounter: Secondary | ICD-10-CM

## 2015-04-19 DIAGNOSIS — K567 Ileus, unspecified: Secondary | ICD-10-CM | POA: Diagnosis not present

## 2015-04-19 DIAGNOSIS — Z79899 Other long term (current) drug therapy: Secondary | ICD-10-CM | POA: Diagnosis not present

## 2015-04-19 DIAGNOSIS — K5732 Diverticulitis of large intestine without perforation or abscess without bleeding: Secondary | ICD-10-CM | POA: Diagnosis present

## 2015-04-19 DIAGNOSIS — F419 Anxiety disorder, unspecified: Secondary | ICD-10-CM | POA: Diagnosis present

## 2015-04-19 DIAGNOSIS — Z01812 Encounter for preprocedural laboratory examination: Secondary | ICD-10-CM | POA: Diagnosis not present

## 2015-04-19 DIAGNOSIS — K660 Peritoneal adhesions (postprocedural) (postinfection): Secondary | ICD-10-CM | POA: Diagnosis present

## 2015-04-19 DIAGNOSIS — I10 Essential (primary) hypertension: Secondary | ICD-10-CM | POA: Diagnosis present

## 2015-04-19 HISTORY — PX: LAPAROSCOPIC LOW ANTERIOR RESECTION: SHX5904

## 2015-04-19 HISTORY — DX: Diverticulitis of large intestine without perforation or abscess without bleeding: K57.32

## 2015-04-19 LAB — TYPE AND SCREEN
ABO/RH(D): O POS
Antibody Screen: NEGATIVE

## 2015-04-19 LAB — CBC
HCT: 43.9 % (ref 39.0–52.0)
Hemoglobin: 15 g/dL (ref 13.0–17.0)
MCH: 30.6 pg (ref 26.0–34.0)
MCHC: 34.2 g/dL (ref 30.0–36.0)
MCV: 89.6 fL (ref 78.0–100.0)
PLATELETS: 167 10*3/uL (ref 150–400)
RBC: 4.9 MIL/uL (ref 4.22–5.81)
RDW: 13.9 % (ref 11.5–15.5)
WBC: 10.6 10*3/uL — AB (ref 4.0–10.5)

## 2015-04-19 LAB — CREATININE, SERUM
CREATININE: 1.16 mg/dL (ref 0.61–1.24)
GFR calc non Af Amer: 58 mL/min — ABNORMAL LOW (ref >60)

## 2015-04-19 SURGERY — RESECTION, RECTUM, LOW ANTERIOR, LAPAROSCOPIC
Anesthesia: General

## 2015-04-19 MED ORDER — PIPERACILLIN-TAZOBACTAM 3.375 G IVPB
3.3750 g | INTRAVENOUS | Status: AC
  Administered 2015-04-19: 3.375 g via INTRAVENOUS
  Filled 2015-04-19: qty 50

## 2015-04-19 MED ORDER — BUPIVACAINE-EPINEPHRINE 0.5% -1:200000 IJ SOLN
INTRAMUSCULAR | Status: DC | PRN
  Administered 2015-04-19: 19 mL

## 2015-04-19 MED ORDER — ONDANSETRON HCL 4 MG/2ML IJ SOLN
4.0000 mg | Freq: Once | INTRAMUSCULAR | Status: DC | PRN
Start: 2015-04-19 — End: 2015-04-19

## 2015-04-19 MED ORDER — CEFOTETAN DISODIUM-DEXTROSE 2-2.08 GM-% IV SOLR
INTRAVENOUS | Status: AC
  Filled 2015-04-19: qty 50

## 2015-04-19 MED ORDER — GLYCOPYRROLATE 0.2 MG/ML IJ SOLN
INTRAMUSCULAR | Status: AC
  Filled 2015-04-19: qty 3

## 2015-04-19 MED ORDER — ONDANSETRON HCL 4 MG/2ML IJ SOLN
4.0000 mg | Freq: Four times a day (QID) | INTRAMUSCULAR | Status: DC | PRN
  Administered 2015-04-19 – 2015-04-24 (×14): 4 mg via INTRAVENOUS
  Filled 2015-04-19 (×14): qty 2

## 2015-04-19 MED ORDER — VITAMIN D3 25 MCG (1000 UNIT) PO TABS
2000.0000 [IU] | ORAL_TABLET | Freq: Every day | ORAL | Status: DC
  Administered 2015-04-19 – 2015-04-27 (×9): 2000 [IU] via ORAL
  Filled 2015-04-19 (×9): qty 2

## 2015-04-19 MED ORDER — LACTATED RINGERS IR SOLN
Status: DC | PRN
  Administered 2015-04-19: 1

## 2015-04-19 MED ORDER — FENTANYL CITRATE (PF) 100 MCG/2ML IJ SOLN
INTRAMUSCULAR | Status: AC
  Filled 2015-04-19: qty 2

## 2015-04-19 MED ORDER — HYDROMORPHONE HCL 1 MG/ML IJ SOLN
INTRAMUSCULAR | Status: DC | PRN
  Administered 2015-04-19 (×4): 0.5 mg via INTRAVENOUS

## 2015-04-19 MED ORDER — FENTANYL CITRATE (PF) 100 MCG/2ML IJ SOLN
INTRAMUSCULAR | Status: DC | PRN
  Administered 2015-04-19: 100 ug via INTRAVENOUS
  Administered 2015-04-19 (×3): 50 ug via INTRAVENOUS
  Administered 2015-04-19: 100 ug via INTRAVENOUS
  Administered 2015-04-19 (×3): 50 ug via INTRAVENOUS

## 2015-04-19 MED ORDER — AMLODIPINE BESYLATE 2.5 MG PO TABS
2.5000 mg | ORAL_TABLET | Freq: Every day | ORAL | Status: DC
  Administered 2015-04-19 – 2015-04-27 (×9): 2.5 mg via ORAL
  Filled 2015-04-19 (×9): qty 1

## 2015-04-19 MED ORDER — CHLORHEXIDINE GLUCONATE 4 % EX LIQD: 60.0000 mL | Freq: Once | CUTANEOUS | Status: DC

## 2015-04-19 MED ORDER — SUCCINYLCHOLINE CHLORIDE 20 MG/ML IJ SOLN
INTRAMUSCULAR | Status: DC | PRN
  Administered 2015-04-19: 100 mg via INTRAVENOUS

## 2015-04-19 MED ORDER — ENOXAPARIN SODIUM 40 MG/0.4ML ~~LOC~~ SOLN
40.0000 mg | SUBCUTANEOUS | Status: DC
  Administered 2015-04-20 – 2015-04-27 (×8): 40 mg via SUBCUTANEOUS
  Filled 2015-04-19 (×9): qty 0.4

## 2015-04-19 MED ORDER — FENTANYL CITRATE (PF) 250 MCG/5ML IJ SOLN
INTRAMUSCULAR | Status: AC
  Filled 2015-04-19: qty 5

## 2015-04-19 MED ORDER — PIPERACILLIN-TAZOBACTAM 3.375 G IVPB
3.3750 g | Freq: Three times a day (TID) | INTRAVENOUS | Status: DC
  Administered 2015-04-19 – 2015-04-22 (×7): 3.375 g via INTRAVENOUS
  Filled 2015-04-19 (×10): qty 50

## 2015-04-19 MED ORDER — ROCURONIUM BROMIDE 100 MG/10ML IV SOLN
INTRAVENOUS | Status: DC | PRN
  Administered 2015-04-19: 50 mg via INTRAVENOUS
  Administered 2015-04-19 (×2): 10 mg via INTRAVENOUS

## 2015-04-19 MED ORDER — IBUPROFEN 200 MG PO TABS: 200.0000 mg | ORAL_TABLET | Freq: Four times a day (QID) | ORAL | Status: DC | PRN

## 2015-04-19 MED ORDER — FENTANYL CITRATE (PF) 100 MCG/2ML IJ SOLN
25.0000 ug | INTRAMUSCULAR | Status: DC | PRN
  Administered 2015-04-19 (×3): 50 ug via INTRAVENOUS

## 2015-04-19 MED ORDER — SUGAMMADEX SODIUM 500 MG/5ML IV SOLN
INTRAVENOUS | Status: AC
  Filled 2015-04-19: qty 5

## 2015-04-19 MED ORDER — PHENYLEPHRINE 40 MCG/ML (10ML) SYRINGE FOR IV PUSH (FOR BLOOD PRESSURE SUPPORT)
PREFILLED_SYRINGE | INTRAVENOUS | Status: AC
  Filled 2015-04-19: qty 10

## 2015-04-19 MED ORDER — ROCURONIUM BROMIDE 100 MG/10ML IV SOLN
INTRAVENOUS | Status: AC
  Filled 2015-04-19: qty 1

## 2015-04-19 MED ORDER — HYDROMORPHONE HCL 2 MG/ML IJ SOLN
INTRAMUSCULAR | Status: AC
  Filled 2015-04-19: qty 1

## 2015-04-19 MED ORDER — HYDROMORPHONE HCL 1 MG/ML IJ SOLN
1.0000 mg | INTRAMUSCULAR | Status: DC | PRN
  Administered 2015-04-19 – 2015-04-20 (×6): 1 mg via INTRAVENOUS
  Filled 2015-04-19 (×7): qty 1

## 2015-04-19 MED ORDER — FLUTICASONE PROPIONATE 50 MCG/ACT NA SUSP: 1.0000 | Freq: Every day | NASAL | Status: DC | PRN

## 2015-04-19 MED ORDER — METOPROLOL TARTRATE 1 MG/ML IV SOLN
5.0000 mg | Freq: Four times a day (QID) | INTRAVENOUS | Status: DC | PRN
  Administered 2015-04-19 – 2015-04-24 (×2): 5 mg via INTRAVENOUS
  Filled 2015-04-19 (×3): qty 5

## 2015-04-19 MED ORDER — LACTATED RINGERS IV SOLN
INTRAVENOUS | Status: DC | PRN
  Administered 2015-04-19 (×5): via INTRAVENOUS

## 2015-04-19 MED ORDER — ONDANSETRON HCL 4 MG PO TABS
4.0000 mg | ORAL_TABLET | Freq: Four times a day (QID) | ORAL | Status: DC | PRN
  Administered 2015-04-20 (×2): 4 mg via ORAL
  Filled 2015-04-19 (×2): qty 1

## 2015-04-19 MED ORDER — LIDOCAINE HCL (CARDIAC) 20 MG/ML IV SOLN
INTRAVENOUS | Status: AC
  Filled 2015-04-19: qty 5

## 2015-04-19 MED ORDER — ONDANSETRON HCL 4 MG/2ML IJ SOLN
INTRAMUSCULAR | Status: AC
  Filled 2015-04-19: qty 2

## 2015-04-19 MED ORDER — DEXAMETHASONE SODIUM PHOSPHATE 10 MG/ML IJ SOLN
INTRAMUSCULAR | Status: AC
  Filled 2015-04-19: qty 1

## 2015-04-19 MED ORDER — POTASSIUM CHLORIDE IN NACL 20-0.9 MEQ/L-% IV SOLN
INTRAVENOUS | Status: DC
  Administered 2015-04-19 – 2015-04-26 (×12): via INTRAVENOUS
  Filled 2015-04-19 (×22): qty 1000

## 2015-04-19 MED ORDER — ALVIMOPAN 12 MG PO CAPS
12.0000 mg | ORAL_CAPSULE | Freq: Two times a day (BID) | ORAL | Status: DC
  Administered 2015-04-20 – 2015-04-22 (×5): 12 mg via ORAL
  Filled 2015-04-19 (×8): qty 1

## 2015-04-19 MED ORDER — PROPOFOL 10 MG/ML IV BOLUS
INTRAVENOUS | Status: DC | PRN
  Administered 2015-04-19: 160 mg via INTRAVENOUS

## 2015-04-19 MED ORDER — PROPOFOL 10 MG/ML IV BOLUS
INTRAVENOUS | Status: AC
  Filled 2015-04-19: qty 40

## 2015-04-19 MED ORDER — BUPIVACAINE-EPINEPHRINE (PF) 0.5% -1:200000 IJ SOLN
INTRAMUSCULAR | Status: AC
  Filled 2015-04-19: qty 30

## 2015-04-19 MED ORDER — OXYCODONE-ACETAMINOPHEN 5-325 MG PO TABS
1.0000 | ORAL_TABLET | ORAL | Status: DC | PRN
  Administered 2015-04-20 (×3): 2 via ORAL
  Filled 2015-04-19 (×3): qty 2

## 2015-04-19 MED ORDER — PHENYLEPHRINE HCL 10 MG/ML IJ SOLN
INTRAMUSCULAR | Status: DC | PRN
  Administered 2015-04-19 (×2): 80 ug via INTRAVENOUS
  Administered 2015-04-19: 40 ug via INTRAVENOUS

## 2015-04-19 MED ORDER — NEOSTIGMINE METHYLSULFATE 10 MG/10ML IV SOLN
INTRAVENOUS | Status: AC
  Filled 2015-04-19: qty 1

## 2015-04-19 MED ORDER — DIPHENHYDRAMINE HCL 12.5 MG/5ML PO ELIX: 12.5000 mg | ORAL_SOLUTION | Freq: Four times a day (QID) | ORAL | Status: DC | PRN

## 2015-04-19 MED ORDER — LIDOCAINE HCL (CARDIAC) 20 MG/ML IV SOLN
INTRAVENOUS | Status: DC | PRN
  Administered 2015-04-19: 50 mg via INTRAVENOUS

## 2015-04-19 MED ORDER — DEXTROSE 5 % IV SOLN
2.0000 g | INTRAVENOUS | Status: AC
  Administered 2015-04-19: 2 g via INTRAVENOUS
  Filled 2015-04-19: qty 2

## 2015-04-19 MED ORDER — ALVIMOPAN 12 MG PO CAPS
12.0000 mg | ORAL_CAPSULE | Freq: Once | ORAL | Status: AC
  Administered 2015-04-19: 12 mg via ORAL
  Filled 2015-04-19: qty 1

## 2015-04-19 MED ORDER — SUGAMMADEX SODIUM 500 MG/5ML IV SOLN
INTRAVENOUS | Status: DC | PRN
  Administered 2015-04-19: 300 mg via INTRAVENOUS

## 2015-04-19 MED ORDER — POLYVINYL ALCOHOL 1.4 % OP SOLN
1.0000 [drp] | Freq: Three times a day (TID) | OPHTHALMIC | Status: DC | PRN
  Filled 2015-04-19: qty 15

## 2015-04-19 MED ORDER — HYPROMELLOSE (GONIOSCOPIC) 2.5 % OP SOLN: 1.0000 [drp] | Freq: Three times a day (TID) | OPHTHALMIC | Status: DC | PRN

## 2015-04-19 MED ORDER — ONDANSETRON HCL 4 MG/2ML IJ SOLN
4.0000 mg | Freq: Once | INTRAMUSCULAR | Status: AC
  Administered 2015-04-19: 4 mg via INTRAVENOUS

## 2015-04-19 MED ORDER — LOSARTAN POTASSIUM 50 MG PO TABS
100.0000 mg | ORAL_TABLET | Freq: Every day | ORAL | Status: DC
  Administered 2015-04-19 – 2015-04-27 (×9): 100 mg via ORAL
  Filled 2015-04-19 (×9): qty 2

## 2015-04-19 MED ORDER — DIPHENHYDRAMINE HCL 50 MG/ML IJ SOLN: 12.5000 mg | Freq: Four times a day (QID) | INTRAMUSCULAR | Status: DC | PRN

## 2015-04-19 MED ORDER — PROMETHAZINE HCL 25 MG/ML IJ SOLN
12.5000 mg | Freq: Four times a day (QID) | INTRAMUSCULAR | Status: DC | PRN
Start: 2015-04-19 — End: 2015-04-27
  Administered 2015-04-19 – 2015-04-22 (×5): 12.5 mg via INTRAVENOUS
  Filled 2015-04-19 (×5): qty 1

## 2015-04-19 SURGICAL SUPPLY — 60 items
APPLIER CLIP 5 13 M/L LIGAMAX5 (MISCELLANEOUS)
APPLIER CLIP ROT 10 11.4 M/L (STAPLE)
APR CLP MED LRG 11.4X10 (STAPLE)
APR CLP MED LRG 5 ANG JAW (MISCELLANEOUS)
BLADE EXTENDED COATED 6.5IN (ELECTRODE) ×1 IMPLANT
BLADE HEX COATED 2.75 (ELECTRODE) ×3 IMPLANT
CELLS DAT CNTRL 66122 CELL SVR (MISCELLANEOUS) ×1 IMPLANT
CHLORAPREP W/TINT 26ML (MISCELLANEOUS) ×1 IMPLANT
CLIP APPLIE 5 13 M/L LIGAMAX5 (MISCELLANEOUS) IMPLANT
CLIP APPLIE ROT 10 11.4 M/L (STAPLE) IMPLANT
COUNTER NEEDLE 20 DBL MAG RED (NEEDLE) ×2 IMPLANT
COVER SURGICAL LIGHT HANDLE (MISCELLANEOUS) ×1 IMPLANT
DECANTER SPIKE VIAL GLASS SM (MISCELLANEOUS) ×2 IMPLANT
DRAIN CHANNEL 19F RND (DRAIN) ×1 IMPLANT
DRAPE LAPAROSCOPIC ABDOMINAL (DRAPES) ×2 IMPLANT
DRAPE UTILITY XL STRL (DRAPES) ×3 IMPLANT
DRSG OPSITE POSTOP 4X10 (GAUZE/BANDAGES/DRESSINGS) IMPLANT
DRSG OPSITE POSTOP 4X6 (GAUZE/BANDAGES/DRESSINGS) IMPLANT
DRSG OPSITE POSTOP 4X8 (GAUZE/BANDAGES/DRESSINGS) ×1 IMPLANT
ELECT REM PT RETURN 9FT ADLT (ELECTROSURGICAL) ×2
ELECTRODE REM PT RTRN 9FT ADLT (ELECTROSURGICAL) ×1 IMPLANT
EVACUATOR SILICONE 100CC (DRAIN) ×1 IMPLANT
GAUZE SPONGE 4X4 12PLY STRL (GAUZE/BANDAGES/DRESSINGS) ×1 IMPLANT
GLOVE BIO SURGEON STRL SZ 6.5 (GLOVE) ×1 IMPLANT
GLOVE EUDERMIC 7 POWDERFREE (GLOVE) ×4 IMPLANT
GLOVE SURG SS PI 7.0 STRL IVOR (GLOVE) ×1 IMPLANT
GOWN STRL REUS W/TWL XL LVL3 (GOWN DISPOSABLE) ×10 IMPLANT
LEGGING LITHOTOMY PAIR STRL (DRAPES) ×2 IMPLANT
LIGASURE IMPACT 36 18CM CVD LR (INSTRUMENTS) ×1 IMPLANT
PACK COLON (CUSTOM PROCEDURE TRAY) ×2 IMPLANT
PAD TELFA 2X3 NADH STRL (GAUZE/BANDAGES/DRESSINGS) ×1 IMPLANT
RETRACTOR WND ALEXIS 18 MED (MISCELLANEOUS) IMPLANT
RTRCTR WOUND ALEXIS 18CM MED (MISCELLANEOUS) ×2
SCISSORS LAP 5X35 DISP (ENDOMECHANICALS) ×2 IMPLANT
SET IRRIG TUBING LAPAROSCOPIC (IRRIGATION / IRRIGATOR) ×2 IMPLANT
SHEARS HARMONIC ACE PLUS 36CM (ENDOMECHANICALS) IMPLANT
SLEEVE XCEL OPT CAN 5 100 (ENDOMECHANICALS) ×5 IMPLANT
SOLUTION ANTI FOG 6CC (MISCELLANEOUS) ×1 IMPLANT
STAPLER CIRC CVD 29MM 37CM (STAPLE) ×1 IMPLANT
STAPLER VISISTAT 35W (STAPLE) ×2 IMPLANT
STRIP CLOSURE SKIN 1/2X4 (GAUZE/BANDAGES/DRESSINGS) IMPLANT
SUT ETHILON 2 0 PS N (SUTURE) ×1 IMPLANT
SUT PDS AB 1 CT1 27 (SUTURE) IMPLANT
SUT PDS AB 1 TP1 96 (SUTURE) IMPLANT
SUT PROLENE 2 0 BLUE (SUTURE) ×1 IMPLANT
SUT SILK 2 0 (SUTURE) ×2
SUT SILK 2 0 SH CR/8 (SUTURE) ×2 IMPLANT
SUT SILK 2-0 18XBRD TIE 12 (SUTURE) ×1 IMPLANT
SUT SILK 3 0 (SUTURE) ×2
SUT SILK 3 0 SH CR/8 (SUTURE) ×2 IMPLANT
SUT SILK 3-0 18XBRD TIE 12 (SUTURE) ×1 IMPLANT
TAPE CLOTH SURG 4X10 WHT LF (GAUZE/BANDAGES/DRESSINGS) ×1 IMPLANT
TOWEL OR NON WOVEN STRL DISP B (DISPOSABLE) ×3 IMPLANT
TRAY FOLEY W/METER SILVER 14FR (SET/KITS/TRAYS/PACK) ×1 IMPLANT
TRAY FOLEY W/METER SILVER 16FR (SET/KITS/TRAYS/PACK) ×2 IMPLANT
TROCAR BLADELESS OPT 5 100 (ENDOMECHANICALS) ×2 IMPLANT
TROCAR XCEL BLUNT TIP 100MML (ENDOMECHANICALS) ×1 IMPLANT
TROCAR XCEL NON-BLD 11X100MML (ENDOMECHANICALS) IMPLANT
TUBING INSUF HEATED (TUBING) ×1 IMPLANT
TUBING INSUFFLATION 10FT LAP (TUBING) ×1 IMPLANT

## 2015-04-19 NOTE — Anesthesia Preprocedure Evaluation (Addendum)
Anesthesia Evaluation  Patient identified by MRN, date of birth, ID band Patient awake    Reviewed: Allergy & Precautions, NPO status , Patient's Chart, lab work & pertinent test results  History of Anesthesia Complications Negative for: history of anesthetic complications  Airway Mallampati: II  TM Distance: >3 FB Neck ROM: Full    Dental no notable dental hx. (+) Dental Advisory Given   Pulmonary neg pulmonary ROS,    Pulmonary exam normal breath sounds clear to auscultation       Cardiovascular hypertension, Pt. on medications Normal cardiovascular exam Rhythm:Regular Rate:Normal  preop cardiac clearance in the chart    Neuro/Psych negative neurological ROS  negative psych ROS   GI/Hepatic Neg liver ROS, GERD  ,  Endo/Other  negative endocrine ROS  Renal/GU negative Renal ROS  negative genitourinary   Musculoskeletal  (+) Arthritis , Osteoarthritis,    Abdominal   Peds negative pediatric ROS (+)  Hematology negative hematology ROS (+)   Anesthesia Other Findings   Reproductive/Obstetrics negative OB ROS                            Anesthesia Physical Anesthesia Plan  ASA: II  Anesthesia Plan: General   Post-op Pain Management:    Induction: Intravenous  Airway Management Planned: Oral ETT  Additional Equipment:   Intra-op Plan:   Post-operative Plan: Extubation in OR  Informed Consent: I have reviewed the patients History and Physical, chart, labs and discussed the procedure including the risks, benefits and alternatives for the proposed anesthesia with the patient or authorized representative who has indicated his/her understanding and acceptance.   Dental advisory given  Plan Discussed with: CRNA  Anesthesia Plan Comments:         Anesthesia Quick Evaluation

## 2015-04-19 NOTE — Op Note (Signed)
Patient Name:           Joel Bauer   Date of Surgery:        04/19/2015  Pre op Diagnosis:      Acute and chronic sigmoid diverticulitis  Post op Diagnosis:    Same  Procedure:                 Laparoscopic assisted low anterior resection with coloproctostomy, 29 mm EEA stapled anastomosis, laparoscopic takedown of splenic flexure  Surgeon:                     Edsel Petrin. Dalbert Batman, M.D., FACS  Assistant:                      Gurney Maxin, M.D.  Operative Indications:   The patient is a 82 year old male who presents with diverticulitis.  He is brought to the hospital electively having undergone bowel prep yesterday.  It is notable that he called me 3 days ago with left lower quadrant pain.  We put him on broad-spectrum antibiotics and the pain resolved although he states he still little bit tender.  On exam this morning he is minimally tender and there is no mass.      Single column barium enema was performed on January 20, 2015. This shows severe rectosigmoid diverticulosis and limited assessment of that region. Some mucosal edema. They could not rule out neoplasm because of the severity of the diverticulosis. The rest of the colon looked fine. I told him we were not very concerned about neoplasia since Dr. Carlean Purl had performed colonoscopy 6 months ago.      His diverticulitis goes back to December 28, 2012 at which time a CT scan showed uncomplicated diverticulitis. He's been treated off and on since then. 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 Carlean Purl  about 6 months ago. The C. difficile colitis has not recurred. He's had a couple of episodes of mild diverticulitis since then that have been treated as an outpatient.       Comorbidities include the C. difficile colitis, benign hypertension, benign prostatic hypertrophy, chronic constipation. Otherwise fairly healthy.  Operative Findings:      He had significant adhesions.  The sigmoid  colon and left colon were densely adherent to the anterior lateral abdominal wall.  Most of these adhesions were chronic.  There was a somewhat of an acute inflammatory reaction but there is no purulence or perforation or abscess.  There was chronic adhesions in the upper abdomen as well with omentum up to the anterior abdominal wall suggesting prior infectious complications.  We were able to resect the obviously diseased segment of sigmoid colon and create an anastomosis between the distal descending colon and the mid rectum above the level of the peritoneal reflection using the stapling device.  We had to take down the splenic flexure extensively to get this done but the anastomosis looked good, no tension, good blood supply, no airleak.  Procedure in Detail:          Following the induction of general endotracheal anesthesia, a Foley catheter was placed.  The patient was positioned in padded rigid stirrups.  The abdomen and perineum were prepped and draped in a sterile fashion.  Surgical timeout was performed.  Intravenous antibiotics were given.      An 11 mm Hassan trocar was placed in the midline just above the umbilicus with open technique.  Pneumoperitoneum was created.  Video camera inserted.  We placed 3 more 5 mm trochars.  We took down all the adhesions.  We mobilized the colon from lateral to medial.  We then lifted up the sigmoid colon and incised the sigmoid and proximal rectum mesentery.  I then mobilized the descending colon and splenic flexure from lateral to medial.  I took some of the omentum off of the left transverse colon to facilitate the dissection.  This took a good deal of time but we were able to see the spleen is see that there was no injury to spleen.  We were able to bring the splenic flexure down so that we could create an anastomosis without tension.     Once I had adequately mobilized everything I released the pneumoperitoneum and made a lower midline incision and placed a  retractor.  I eviscerated the diseased colon and could see the proximal and distal limits of resection and judge that there was no need to mobilize further.  I created a window in the mesentery of the mid rectum and divided the mid rectum with the contour stapling device.  Chose the proximal limits of resection and divided it between [facility] clamps.  The rectosigmoid mesentery was then taken down with LigaSure device and 2-0 silk ties.  The specimen was marked with sutures and sent to the lab.  Abdomen and pelvis were irrigated.  Hemostasis was excellent.  Suture ligated a couple of vessels in the mesentery.  I opened up the proximal bowel and it was pink and healthy.  Purstring suture of 0 Prolene was placed around the bowel end..  I then placed sizers in this and found that it would except a 29 mm stapler.  The anvil of the 29 mm stapler was inserted into the proximal colon and the Purstring suture tied and this looked good.  Dr. Kieth Brightly then went down to the perineum and dilated the rectum and then passed the stapler up the rectal stump.  We positioned this carefully to be sure  there was no twisting.  He opened the spike of the stapler.  The proximal: Anvil was then secured to the spike in the stapler.  The stapler was closed, fired, opened, and removed..  We had 2 complete donuts of tissue.  Proctoscopy against pressure showed no airleak.  We were satisfied.     We then changed our gowns gloves instruments and redraped the patient.  We irrigated out the abdomen and pelvis and the didn't seem to be any active bleeding.  Worthington drain was placed through a right lower quadrant trocar site and placed down in the pelvis anterior to the anastomosis and sutured to the skin and connected to a suction bulb.  The midline fascia and peritoneum were closed with a running #1 double strand PDS suture was irrigated and the skin closed with skin staples.  We then reinsufflated the abdomen and did a 4 quadrant  inspection and saw almost no blood and certainly no clot or active bleeding.  The bowel was pink and healthy.  The pneumoperitoneum was released and the trochars removed.  The fascia at the umbilicus was closed with 0 Vicryl sutures.  The rest of the skin incisions were closed with skin staples.  We placed Telfa wicks between the staples in the lower midline incision and protocol bandages were placed.  The patient tolerated the procedure well and was taken to PACU in stable condition.  EBL 150 mL or less.  Counts correct.  Complications none.          Edsel Petrin. Dalbert Batman, M.D., FACS General and Minimally Invasive Surgery Breast and Colorectal Surgery  04/19/2015 2:39 PM

## 2015-04-19 NOTE — Progress Notes (Signed)
Pharmacy Antibiotic Note  Joel Bauer is a 82 y.o. male admitted on 04/19/2015 with acute on chronic sigmoid diverticulitis s/p LAR with coloproctostomy, takedown of splenic fixture.  Note hx C.difficile colitis requiring fecal transplant in the past.  Pharmacy has been consulted for Zosyn dosing for intra-abdominal infection.   Plan: No micro data available. WBC WNL.  CrCl ~61 ml/min.  Afebrile.  Received cefotetan pre-op.  Start Zosyn 3.375g IV q8h (infuse over 4 hours) for CrCl >30 ml/min F/u duration of therapy  Temp (24hrs), Avg:97.5 F (36.4 C), Min:97.4 F (36.3 C), Max:97.6 F (36.4 C)   Recent Labs Lab 04/14/15 1415  WBC 4.8  CREATININE 1.05    Estimated Creatinine Clearance: 60.8 mL/min (by C-G formula based on Cr of 1.05).    No Known Allergies  Antimicrobials this admission: 1/30 Zosyn >>    Dose adjustments this admission: N/A  Microbiology results: None  Thank you for allowing pharmacy to be a part of this patient's care.  Ralene Bathe, PharmD, BCPS 04/19/2015, 4:10 PM  Pager: [phone]

## 2015-04-19 NOTE — Anesthesia Postprocedure Evaluation (Signed)
Anesthesia Post Note  Patient: Joel Bauer  Procedure(s) Performed: Procedure(s) (LRB): LAPAROSCOPIC ASSISTED  LOW ANTERIOR RESECTION, splenic flexure mobilization (N/A)  Patient location during evaluation: PACU Anesthesia Type: General Level of consciousness: awake and alert Pain management: pain level controlled Vital Signs Assessment: post-procedure vital signs reviewed and stable Respiratory status: spontaneous breathing, nonlabored ventilation, respiratory function stable and patient connected to nasal cannula oxygen Cardiovascular status: blood pressure returned to baseline and stable Postop Assessment: no signs of nausea or vomiting Anesthetic complications: no    Last Vitals:  Filed Vitals:   04/19/15 1530 04/19/15 1545  BP: 164/86 191/82  Pulse: 84 68  Temp: 36.3 C 36.3 C  Resp: 20 16    Last Pain:  Filed Vitals:   04/19/15 1550  PainSc: Asleep                 Endre Coutts JENNETTE

## 2015-04-19 NOTE — Transfer of Care (Signed)
Immediate Anesthesia Transfer of Care Note  Patient: Joel Bauer  Procedure(s) Performed: Procedure(s): LAPAROSCOPIC ASSISTED  LOW ANTERIOR RESECTION, splenic flexure mobilization (N/A)  Patient Location: PACU  Anesthesia Type:General  Level of Consciousness: sedated, patient cooperative and responds to stimulation  Airway & Oxygen Therapy: Patient Spontanous Breathing and Patient connected to face mask oxygen  Post-op Assessment: Report given to RN and Post -op Vital signs reviewed and stable  Post vital signs: Reviewed and stable  Last Vitals:  Filed Vitals:   04/19/15 0903  BP: 160/80  Pulse: 78  Temp: 36.3 C  Resp: 18    Complications: No apparent anesthesia complications

## 2015-04-19 NOTE — Anesthesia Procedure Notes (Signed)
Procedure Name: Intubation Performed by: Gean Maidens Pre-anesthesia Checklist: Patient identified, Emergency Drugs available, Suction available, Patient being monitored and Timeout performed Patient Re-evaluated:Patient Re-evaluated prior to inductionOxygen Delivery Method: Circle system utilized Preoxygenation: Pre-oxygenation with 100% oxygen Intubation Type: IV induction Ventilation: Mask ventilation without difficulty Laryngoscope Size: Glidescope and 4 Grade View: Grade I Tube type: Oral Tube size: 7.5 mm Number of attempts: 2 Airway Equipment and Method: Stylet Placement Confirmation: ETT inserted through vocal cords under direct vision,  positive ETCO2,  CO2 detector and breath sounds checked- equal and bilateral Secured at: 23 cm Tube secured with: Tape Dental Injury: Teeth and Oropharynx as per pre-operative assessment

## 2015-04-20 LAB — BASIC METABOLIC PANEL
ANION GAP: 9 (ref 5–15)
BUN: 13 mg/dL (ref 6–20)
CO2: 25 mmol/L (ref 22–32)
Calcium: 8.1 mg/dL — ABNORMAL LOW (ref 8.9–10.3)
Chloride: 103 mmol/L (ref 101–111)
Creatinine, Ser: 0.94 mg/dL (ref 0.61–1.24)
GLUCOSE: 113 mg/dL — AB (ref 65–99)
POTASSIUM: 4.1 mmol/L (ref 3.5–5.1)
Sodium: 137 mmol/L (ref 135–145)

## 2015-04-20 LAB — CBC
HEMATOCRIT: 39.1 % (ref 39.0–52.0)
HEMOGLOBIN: 13.2 g/dL (ref 13.0–17.0)
MCH: 30.4 pg (ref 26.0–34.0)
MCHC: 33.8 g/dL (ref 30.0–36.0)
MCV: 90.1 fL (ref 78.0–100.0)
Platelets: 138 10*3/uL — ABNORMAL LOW (ref 150–400)
RBC: 4.34 MIL/uL (ref 4.22–5.81)
RDW: 14.1 % (ref 11.5–15.5)
WBC: 8.5 10*3/uL (ref 4.0–10.5)

## 2015-04-20 MED ORDER — HYDROMORPHONE 1 MG/ML IV SOLN
INTRAVENOUS | Status: DC
  Administered 2015-04-21: 2.1 mg via INTRAVENOUS
  Administered 2015-04-21: 0.6 mg via INTRAVENOUS
  Administered 2015-04-21: 01:00:00 via INTRAVENOUS
  Administered 2015-04-21 (×2): 1.5 mg via INTRAVENOUS
  Administered 2015-04-21: 0.9 mg via INTRAVENOUS
  Filled 2015-04-20: qty 25

## 2015-04-20 MED ORDER — SODIUM CHLORIDE 0.9 % IV SOLN: 1.0000 mg/h | INTRAVENOUS | Status: DC

## 2015-04-20 MED ORDER — SODIUM CHLORIDE 0.9% FLUSH: 9.0000 mL | INTRAVENOUS | Status: DC | PRN

## 2015-04-20 MED ORDER — NALOXONE HCL 0.4 MG/ML IJ SOLN: 0.4000 mg | INTRAMUSCULAR | Status: DC | PRN

## 2015-04-20 NOTE — Progress Notes (Addendum)
1 Day Post-Op  Subjective: Stable and alert.  Doing reasonably well.  Denies nausea.  Denies dyspnea. Initially was hypertensive postop but this is, under good control with oral medications and PRN Lopressor. Currently heart rate 79;   BP 129/61;  afebrile Urine is clear and good urine output Pain control seems good.  I told him that I had him on antibiotics because his colon was a little bit inflamed.  He is concerned about recurrent C. difficile colitis, which is appropriate since he required fecal transplant in the past.  My judgment is that we need to give him 48 hours of antibiotics or so.  Hemoglobin 13.2.  WBC 8500.  Potassium 4.1.  Creatinine 0.91.  Glucose 113.  Objective: Vital signs in last 24 hours: Temp:  [97.4 F (36.3 C)-98.4 F (36.9 C)] 97.8 F (36.6 C) (01/31 0513) Pulse Rate:  [65-87] 79 (01/31 0513) Resp:  [12-20] 14 (01/31 0513) BP: (125-200)/(58-95) 129/61 mmHg (01/31 0513) SpO2:  [96 %-100 %] 96 % (01/31 0513) Weight:  [74.844 kg (165 lb)] 74.844 kg (165 lb) (01/30 1800) Last BM Date: 04/19/15  Intake/Output from previous day: 01/30 0701 - 01/31 0700 In: 5883.3 [I.V.:5783.3; IV Piggyback:100] Out: [id number] [Urine:1280; Drains:85; Blood:100] Intake/Output this shift: Total I/O In: 1050 [I.V.:1000; IV Piggyback:50] Out: [id number] [Urine:700; Drains:35]  General appearance: Alert.  Cooperative.  Mental status normal.  Son is in room.  Minimal distress. Resp: clear to auscultation bilaterally GI: Soft.  Nondistended.  Appropriately tender.  Wounds clean and dry.  JP serosanguineous and low volume output.  Lab Results:  Results for orders placed or performed during the hospital encounter of 04/19/15 (from the past 24 hour(s))  CBC     Status: Abnormal   Collection Time: 04/19/15  4:31 PM  Result Value Ref Range   WBC 10.6 (H) 4.0 - 10.5 K/uL   RBC 4.90 4.22 - 5.81 MIL/uL   Hemoglobin 15.0 13.0 - 17.0 g/dL   HCT 43.9 39.0 - 52.0 %   MCV 89.6 78.0 - 100.0 fL   MCH 30.6 26.0 - 34.0 pg   MCHC 34.2 30.0 - 36.0 g/dL   RDW 13.9 11.5 - 15.5 %   Platelets 167 150 - 400 K/uL  Creatinine, serum     Status: Abnormal   Collection Time: 04/19/15  4:31 PM  Result Value Ref Range   Creatinine, Ser 1.16 0.61 - 1.24 mg/dL   GFR calc non Af Amer 58 (L) >60 mL/min   GFR calc Af Amer >60 >60 mL/min  Basic metabolic panel     Status: Abnormal   Collection Time: 04/20/15  5:20 AM  Result Value Ref Range   Sodium 137 135 - 145 mmol/L   Potassium 4.1 3.5 - 5.1 mmol/L   Chloride 103 101 - 111 mmol/L   CO2 25 22 - 32 mmol/L   Glucose, Bld 113 (H) 65 - 99 mg/dL   BUN 13 6 - 20 mg/dL   Creatinine, Ser 0.94 0.61 - 1.24 mg/dL   Calcium 8.1 (L) 8.9 - 10.3 mg/dL   GFR calc non Af Amer >60 >60 mL/min   GFR calc Af Amer >60 >60 mL/min   Anion gap 9 5 - 15  CBC     Status: Abnormal   Collection Time: 04/20/15  5:20 AM  Result Value Ref Range   WBC 8.5 4.0 - 10.5 K/uL   RBC 4.34 4.22 - 5.81 MIL/uL   Hemoglobin 13.2 13.0 - 17.0 g/dL   HCT 39.1  39.0 - 52.0 %   MCV 90.1 78.0 - 100.0 fL   MCH 30.4 26.0 - 34.0 pg   MCHC 33.8 30.0 - 36.0 g/dL   RDW 14.1 11.5 - 15.5 %   Platelets 138 (L) 150 - 400 K/uL     Studies/Results: No results found.  Marland Kitchen alvimopan  12 mg Oral BID  . amLODipine  2.5 mg Oral Daily  . cholecalciferol  2,000 Units Oral Daily  . enoxaparin (LOVENOX) injection  40 mg Subcutaneous Q24H  . losartan  100 mg Oral Daily  . piperacillin-tazobactam (ZOSYN)  IV  3.375 g Intravenous Q8H     Assessment/Plan: s/p Procedure(s): LAPAROSCOPIC ASSISTED  LOW ANTERIOR RESECTION, splenic flexure mobilization   POD #1.  Laparoscopic assisted low anterior resection and takedown splenic flexure. Possible recent flare Clear liquids entereg Incentive spirometry Mobilize Lovenox for DVT prophylaxis  Hypertension-I have restarted his Norvasc and Cozaar.  Lopressor on standby.  Seems well controlled  History C. difficile colitis requiring fecal  transplant.--Plan short course of antibiotics because of mild acute inflammation.  BPH-remove Foley POD #2  @PROBHOSP @  LOS: 1 day    Kelliann Pendergraph M 04/20/2015  . .prob

## 2015-04-21 ENCOUNTER — Inpatient Hospital Stay (HOSPITAL_COMMUNITY): Payer: Medicare Other

## 2015-04-21 LAB — COMPREHENSIVE METABOLIC PANEL
ALT: 25 U/L (ref 17–63)
AST: 24 U/L (ref 15–41)
Albumin: 3.2 g/dL — ABNORMAL LOW (ref 3.5–5.0)
Alkaline Phosphatase: 40 U/L (ref 38–126)
Anion gap: 9 (ref 5–15)
BUN: 11 mg/dL (ref 6–20)
CHLORIDE: 100 mmol/L — AB (ref 101–111)
CO2: 25 mmol/L (ref 22–32)
CREATININE: 0.97 mg/dL (ref 0.61–1.24)
Calcium: 8.6 mg/dL — ABNORMAL LOW (ref 8.9–10.3)
Glucose, Bld: 125 mg/dL — ABNORMAL HIGH (ref 65–99)
Potassium: 3.9 mmol/L (ref 3.5–5.1)
Sodium: 134 mmol/L — ABNORMAL LOW (ref 135–145)
Total Bilirubin: 1 mg/dL (ref 0.3–1.2)
Total Protein: 5.7 g/dL — ABNORMAL LOW (ref 6.5–8.1)

## 2015-04-21 LAB — CBC WITH DIFFERENTIAL/PLATELET
Basophils Absolute: 0 10*3/uL (ref 0.0–0.1)
Basophils Relative: 0 %
EOS ABS: 0 10*3/uL (ref 0.0–0.7)
Eosinophils Relative: 0 %
HCT: 37.4 % — ABNORMAL LOW (ref 39.0–52.0)
Hemoglobin: 12.7 g/dL — ABNORMAL LOW (ref 13.0–17.0)
LYMPHS ABS: 0.8 10*3/uL (ref 0.7–4.0)
LYMPHS PCT: 9 %
MCH: 30.8 pg (ref 26.0–34.0)
MCHC: 34 g/dL (ref 30.0–36.0)
MCV: 90.6 fL (ref 78.0–100.0)
MONOS PCT: 7 %
Monocytes Absolute: 0.6 10*3/uL (ref 0.1–1.0)
NEUTROS ABS: 7.5 10*3/uL (ref 1.7–7.7)
NEUTROS PCT: 84 %
PLATELETS: 131 10*3/uL — AB (ref 150–400)
RBC: 4.13 MIL/uL — AB (ref 4.22–5.81)
RDW: 14.1 % (ref 11.5–15.5)
WBC: 8.9 10*3/uL (ref 4.0–10.5)

## 2015-04-21 MED ORDER — KETOROLAC TROMETHAMINE 30 MG/ML IJ SOLN
30.0000 mg | Freq: Four times a day (QID) | INTRAMUSCULAR | Status: AC | PRN
Start: 2015-04-21 — End: 2015-04-24
  Administered 2015-04-21 – 2015-04-24 (×10): 30 mg via INTRAVENOUS
  Filled 2015-04-21 (×10): qty 1

## 2015-04-21 MED ORDER — SIMETHICONE 80 MG PO CHEW
80.0000 mg | CHEWABLE_TABLET | Freq: Four times a day (QID) | ORAL | Status: DC | PRN
  Administered 2015-04-21 – 2015-04-24 (×7): 80 mg via ORAL
  Filled 2015-04-21 (×11): qty 1

## 2015-04-21 MED ORDER — PANTOPRAZOLE SODIUM 40 MG IV SOLR
40.0000 mg | Freq: Two times a day (BID) | INTRAVENOUS | Status: DC
  Administered 2015-04-21 – 2015-04-25 (×9): 40 mg via INTRAVENOUS
  Filled 2015-04-21 (×10): qty 40

## 2015-04-21 NOTE — Progress Notes (Addendum)
2 Days Post-Op  Subjective: Stable.  Alert.  Cooperative.  Afebrile.  Heart rate 78.  BP 130/63.  Urine clear with good output.   Did not feel as well last 12 hours.  Gassy.  Nauseated.  No vomiting.  Pain medicine had to be switched to PCA but better control with pain now. he says he passed a little flatus.  Says he did ambulate 3 times yesterday short distances in the hall. Foley coming out this morning   Hemoglobin 12.7.  WBC 8900.  Electrolytes pending.    Objective: Vital signs in last 24 hours: Temp:  [97.5 F (36.4 C)-99.4 F (37.4 C)] 97.5 F (36.4 C) (02/01 0624) Pulse Rate:  [73-86] 78 (02/01 0624) Resp:  [13-16] 16 (02/01 0624) BP: (130-141)/(62-71) 130/63 mmHg (02/01 0624) SpO2:  [92 %-96 %] 96 % (02/01 0624) FiO2 (%):  [46 %] 46 % (02/01 0423) Last BM Date: 04/19/15  Intake/Output from previous day: 01/31 0701 - 02/01 0700 In: 1150 [I.V.:1100; IV Piggyback:50] Out: 2395 [Urine:2300; Drains:95] Intake/Output this shift: Total I/O In: 1150 [I.V.:1100; IV Piggyback:50] Out: 1150 [Urine:1100; Drains:50]   EXAM: General appearance: Alert.  Cooperative.  Mild distress.  Son is in room.  Oriented.  Skin warm and dry Lungs: Clear to auscultation bilaterally Abdomen: Soft.  Minimally tender.  A little distended and tympanitic.  Hypoactive bowel sounds.  Incisions look fine.  JP drainage thin, serosanguineous.  No odor.   Lab Results:  Results for orders placed or performed during the hospital encounter of 04/19/15 (from the past 24 hour(s))  CBC with Differential/Platelet     Status: Abnormal   Collection Time: 04/21/15  6:15 AM  Result Value Ref Range   WBC 8.9 4.0 - 10.5 K/uL   RBC 4.13 (L) 4.22 - 5.81 MIL/uL   Hemoglobin 12.7 (L) 13.0 - 17.0 g/dL   HCT 37.4 (L) 39.0 - 52.0 %   MCV 90.6 78.0 - 100.0 fL   MCH 30.8 26.0 - 34.0 pg   MCHC 34.0 30.0 - 36.0 g/dL   RDW 14.1 11.5 - 15.5 %   Platelets 131 (L) 150 - 400 K/uL   Neutrophils Relative % 84 %   Neutro  Abs 7.5 1.7 - 7.7 K/uL   Lymphocytes Relative 9 %   Lymphs Abs 0.8 0.7 - 4.0 K/uL   Monocytes Relative 7 %   Monocytes Absolute 0.6 0.1 - 1.0 K/uL   Eosinophils Relative 0 %   Eosinophils Absolute 0.0 0.0 - 0.7 K/uL   Basophils Relative 0 %   Basophils Absolute 0.0 0.0 - 0.1 K/uL     Studies/Results: No results found.  Marland Kitchen alvimopan  12 mg Oral BID  . amLODipine  2.5 mg Oral Daily  . cholecalciferol  2,000 Units Oral Daily  . enoxaparin (LOVENOX) injection  40 mg Subcutaneous Q24H  . HYDROmorphone   Intravenous 6 times per day  . losartan  100 mg Oral Daily  . pantoprazole (PROTONIX) IV  40 mg Intravenous Q12H  . piperacillin-tazobactam (ZOSYN)  IV  3.375 g Intravenous Q8H     Assessment/Plan: s/p Procedure(s): LAPAROSCOPIC ASSISTED  LOW ANTERIOR RESECTION, splenic flexure mobilization   POD #2. Laparoscopic assisted low anterior resection and takedown splenic flexure. Possible recent flare--continue antibiotics, possibly discontinued tomorrow Postop ileus Clear liquids-minimize Add toradol to hopefully reduce narcotic effect on gut. Check KUB entereg Incentive spirometry Mobilize Lovenox for DVT prophylaxis  Hypertension-I have restarted his Norvasc and Cozaar. Lopressor on standby. Seems well controlled  History  C. difficile colitis requiring fecal transplant.--Plan short course of antibiotics because of mild acute inflammation. Dc tomorrow  BPH-remove Foley today  @PROBHOSP @  LOS: 2 days    Nadina Fomby M 04/21/2015  . .prob

## 2015-04-21 NOTE — Progress Notes (Signed)
Pt decline to ambulate due to abdominal pain. Staff will continue to encourage and educated on ambulation. Will continue to montior. Roslyn Smiling, RN

## 2015-04-22 MED ORDER — HYDROCODONE-ACETAMINOPHEN 5-325 MG PO TABS: 1.0000 | ORAL_TABLET | ORAL | Status: DC | PRN

## 2015-04-22 MED ORDER — BOOST / RESOURCE BREEZE PO LIQD
1.0000 | Freq: Three times a day (TID) | ORAL | Status: DC
  Administered 2015-04-22: 20:00:00 via ORAL
  Administered 2015-04-22 – 2015-04-24 (×3): 1 via ORAL

## 2015-04-22 MED ORDER — FENTANYL CITRATE (PF) 100 MCG/2ML IJ SOLN
12.5000 ug | INTRAMUSCULAR | Status: DC | PRN
  Administered 2015-04-22 (×2): 12.5 ug via INTRAVENOUS
  Filled 2015-04-22 (×2): qty 2

## 2015-04-22 NOTE — Care Management Important Message (Signed)
Important Message  Patient Details  Name: JUSITN FEI MRN: [id number] Date of Birth: [birth year]   Medicare Important Message Given:  Yes    Camillo Flaming 04/22/2015, 10:00 AMImportant Message  Patient Details  Name: EARLIS BIZZELL MRN: [id number] Date of Birth: 1933/08/26   Medicare Important Message Given:  Yes    Camillo Flaming 04/22/2015, 9:59 AM

## 2015-04-22 NOTE — Progress Notes (Signed)
3 Days Post-Op  Subjective: Stable.  Alert.  Cooperative.  Other than some anxiety, which is chronic, mental status normal. He says he feels better because the pain is less.  Still has some nausea. He's had a couple of loose stools and passed some flatus, although still feels a little nauseated. He's been trying to use the Toradol instead of the Dilaudid PCA Ambulating in hall 3 times Pathology shows diverticulitis with healthy margins.  Discussed with patient and son.  Blood work yesterday looked fine.  Abdominal x-ray yesterday consistent with significant ileus pattern colonic distention and some small bowel distention.  Objective: Vital signs in last 24 hours: Temp:  [97.7 F (36.5 C)-98.7 F (37.1 C)] 97.7 F (36.5 C) (02/02 0507) Pulse Rate:  [75-83] 76 (02/02 0507) Resp:  [13-19] 18 (02/02 0507) BP: (144-160)/(63-82) 160/75 mmHg (02/02 0507) SpO2:  [1 %-98 %] 97 % (02/02 0507) Last BM Date: 04/20/15  Intake/Output from previous day: 02/01 0701 - 02/02 0700 In: 1855 [I.V.:1805; IV Piggyback:50] Out: 1803 [Urine:1395; Drains:408] Intake/Output this shift: Total I/O In: 655 [I.V.:605; IV Piggyback:50] Out: 888 [Urine:670; Drains:218]    EXAM: General appearance: Alert and cooperative.  A little anxious.  Skin warm and dry.  No physical distress Resp: clear to auscultation bilaterally GI: Soft.  Almost no tenderness.  Still distended and tympanitic.  Very active bowel sounds now.  Incisions look fine.  JP drainage is thin and serosanguineous.  No odor.    Lab Results:  No results found for this or any previous visit (from the past 24 hour(s)).   Studies/Results: Dg Abd 1 View  04/21/2015  CLINICAL DATA:  Postop nausea, colon surgery EXAM: ABDOMEN - 1 VIEW COMPARISON:  12/29/2014 FINDINGS: There are gaseous distended colonic and small bowel loops most likely due to ileus. No air-fluid levels are noted. Midline skin staples. A surgical drain is noted right lower quadrant  with tip within pelvis. Pelvic phleboliths are noted. IMPRESSION: Gaseous distended colonic and small bowel loops consistent with ileus. Electronically Signed   By: Lahoma Crocker M.D.   On: 04/21/2015 14:13    . alvimopan  12 mg Oral BID  . amLODipine  2.5 mg Oral Daily  . cholecalciferol  2,000 Units Oral Daily  . enoxaparin (LOVENOX) injection  40 mg Subcutaneous Q24H  . feeding supplement  1 Container Oral TID BM  . losartan  100 mg Oral Daily  . pantoprazole (PROTONIX) IV  40 mg Intravenous Q12H     Assessment/Plan: s/p Procedure(s): LAPAROSCOPIC ASSISTED  LOW ANTERIOR RESECTION, splenic flexure mobilization  POD #3. Laparoscopic assisted low anterior resection and takedown splenic flexure. Possible recent flare--discontinue antibiotics now Postop ileus, hopefully this will resolve over the next day or 2 Clear liquids plus Boost supliments Add toradol to hopefully reduce narcotic effect on gut. Discontinue PCA.  Low-dose fentanyl if needed for rescue. entereg Incentive spirometry Mobilize Lovenox for DVT prophylaxis  Hypertension-I have restarted his Norvasc and Cozaar. Lopressor on standby. Seems well controlled  History C. difficile colitis requiring fecal transplant.--Plan short course of antibiotics because of mild acute inflammation. Dc today  BPH-voiding without difficulty.  Good output.  @PROBHOSP @  LOS: 3 days    Tonie Elsey M 04/22/2015  . .prob

## 2015-04-23 LAB — CBC
HCT: 36.8 % — ABNORMAL LOW (ref 39.0–52.0)
HEMOGLOBIN: 12.2 g/dL — AB (ref 13.0–17.0)
MCH: 29.9 pg (ref 26.0–34.0)
MCHC: 33.2 g/dL (ref 30.0–36.0)
MCV: 90.2 fL (ref 78.0–100.0)
Platelets: 165 10*3/uL (ref 150–400)
RBC: 4.08 MIL/uL — ABNORMAL LOW (ref 4.22–5.81)
RDW: 14.4 % (ref 11.5–15.5)
WBC: 9 10*3/uL (ref 4.0–10.5)

## 2015-04-23 LAB — BASIC METABOLIC PANEL
ANION GAP: 8 (ref 5–15)
BUN: 15 mg/dL (ref 6–20)
CALCIUM: 8 mg/dL — AB (ref 8.9–10.3)
CHLORIDE: 109 mmol/L (ref 101–111)
CO2: 21 mmol/L — ABNORMAL LOW (ref 22–32)
CREATININE: 0.82 mg/dL (ref 0.61–1.24)
GFR calc non Af Amer: >60 mL/min (ref >60)
Glucose, Bld: 109 mg/dL — ABNORMAL HIGH (ref 65–99)
Potassium: 4 mmol/L (ref 3.5–5.1)
SODIUM: 138 mmol/L (ref 135–145)

## 2015-04-23 LAB — C DIFFICILE QUICK SCREEN W PCR REFLEX
C DIFFICILE (CDIFF) INTERP: NEGATIVE
C DIFFICILE (CDIFF) TOXIN: NEGATIVE
C DIFFICLE (CDIFF) ANTIGEN: NEGATIVE

## 2015-04-23 NOTE — Care Management Note (Signed)
Case Management Note  Patient Details  Name: Joel Bauer MRN: [id number] Date of Birth: 29-Dec-1933  Subjective/Objective:     S/p Laparoscopic assisted low anterior resection                Action/Plan: Discharge planning, no HH needs identified. Patient states he has someone to assist at d/c. Ambulating in room, taking po's but limited, continues to be distended. He anticipates d/c on Sunday or Monday depending on progress.   Expected Discharge Date:                  Expected Discharge Plan:  Home/Self Care  In-House Referral:  NA  Discharge planning Services  CM Consult  Post Acute Care Choice:  NA Choice offered to:  NA  DME Arranged:  N/A DME Agency:  NA  HH Arranged:  NA HH Agency:  NA  Status of Service:  Completed, signed off  Medicare Important Message Given:  Yes Date Medicare IM Given:    Medicare IM give by:    Date Additional Medicare IM Given:    Additional Medicare Important Message give by:     If discussed at [place] of Stay Meetings, dates discussed:    Additional Comments:  Guadalupe Maple, RN 04/23/2015, 10:40 AM [phone]

## 2015-04-23 NOTE — Evaluation (Signed)
Physical Therapy Evaluation Patient Details Name: Joel Bauer MRN: [id number] DOB: 1933-06-13 Today's Date: 04/23/2015   History of Present Illness  Pt s/p Laparoscopic assised lower anterior resections with coloproctostomy 2* diverticulitis.  Clinical Impression  Pt admitted as above and presenting with functional mobility limitations 2* pain associated with movement post-op.  Pt should progress to dc home with family assist.    Follow Up Recommendations No PT follow up    Equipment Recommendations  None recommended by PT    Recommendations for Other Services       Precautions / Restrictions Precautions Precautions: Other (comment) Precaution Comments: JP drain on right Restrictions Weight Bearing Restrictions: No      Mobility  Bed Mobility Overal bed mobility: Needs Assistance Bed Mobility: Rolling;Supine to Sit Rolling: Min guard   Supine to sit: Min assist     General bed mobility comments: cues for correct log roll technique  Transfers Overall transfer level: Needs assistance Equipment used: Rolling walker (2 wheeled) Transfers: Sit to/from Stand Sit to Stand: Min assist         General transfer comment: cues for use of UEs to self assist  Ambulation/Gait Ambulation/Gait assistance: Min guard Ambulation Distance (Feet): 400 Feet Assistive device: Rolling walker (2 wheeled) Gait Pattern/deviations: Step-through pattern;Decreased step length - right;Decreased step length - left;Shuffle;Trunk flexed Gait velocity: mod pace Gait velocity interpretation: Below normal speed for age/gender General Gait Details: cues for posture and position from RW  Stairs            Wheelchair Mobility    Modified Rankin (Stroke Patients Only)       Balance                                             Pertinent Vitals/Pain Pain Assessment: 0-10 Pain Score: 4  Pain Location: lower right abdominal pain Pain Descriptors / Indicators:  Aching;Sore Pain Intervention(s): Limited activity within patient's tolerance;Monitored during session;Premedicated before session    [name] expects to be discharged to:: Private residence Living Arrangements: Alone Available Help at Discharge: Family Type of Home: House Home Access: Stairs to enter Entrance Stairs-Rails: None Technical brewer of Steps: 3 Home Layout: One level Home Equipment: None Additional Comments: Sister and son will be assisting    Prior Function Level of Independence: Independent               Hand Dominance        Extremity/Trunk Assessment   Upper Extremity Assessment: Overall WFL for tasks assessed           Lower Extremity Assessment: Overall WFL for tasks assessed      Cervical / Trunk Assessment: Normal  Communication   Communication: No difficulties  Cognition Arousal/Alertness: Awake/alert Behavior During Therapy: WFL for tasks assessed/performed Overall Cognitive Status: Within Functional Limits for tasks assessed                      General Comments      Exercises        Assessment/Plan    PT Assessment Patient needs continued PT services  PT Diagnosis Difficulty walking   PT Problem List Decreased activity tolerance;Decreased mobility;Decreased knowledge of use of DME;Pain  PT Treatment Interventions DME instruction;Gait training;Stair training;Functional mobility training;Therapeutic activities;Therapeutic exercise;Patient/family education   PT Goals (Current goals can be found in the Care  Plan section) Acute Rehab PT Goals Patient Stated Goal: Resume previous active lifestyle asap PT Goal Formulation: With patient Time For Goal Achievement: 05/07/15 Potential to Achieve Goals: Good    Frequency Min 3X/week   Barriers to discharge        Co-evaluation               End of Session   Activity Tolerance: Patient tolerated treatment well;No increased pain Patient  left: in bed;with call bell/phone within reach;with family/visitor present Nurse Communication: Mobility status         Time: 1150-1210 PT Time Calculation (min) (ACUTE ONLY): 20 min   Charges:   PT Evaluation $PT Eval Low Complexity: 1 Procedure     PT G Codes:        Joel Bauer 05-20-15, 1:14 PM

## 2015-04-23 NOTE — Progress Notes (Addendum)
4 Days Post-Op  Subjective: Stable and alert. Slowly improving.  Walking a lot.  Nausea is intermittent. Has had 2 bowel movements and flatus. Still feels distended. Lots of question and worries that he is not progressing as he should Some confusion with fentanyl and Phenergan.  Trying to use Toradol and Zofran.  Lab work looks good.  Hemoglobin 12.2.  WBC 9000.  Potassium 4.0.  Creatinine 0.82.  Glucose 109.  Objective: Vital signs in last 24 hours: Temp:  [98.6 F (37 C)-98.9 F (37.2 C)] 98.6 F (37 C) (02/03 0540) Pulse Rate:  [81-93] 93 (02/03 0540) Resp:  [16] 16 (02/03 0540) BP: (145-156)/(69-77) 148/70 mmHg (02/03 0540) SpO2:  [92 %-97 %] 92 % (02/03 0540) Last BM Date: 04/22/15  Intake/Output from previous day: 02/02 0701 - 02/03 0700 In: 2600 [P.O.:600; I.V.:2000] Out: 1091 [Urine:560; Drains:530; Stool:1] Intake/Output this shift: Total I/O In: 2000 [I.V.:2000] Out: 341 [Urine:200; Drains:140; Stool:1]    EXAM: General appearance: Alert.  No physical distress.  Mental status good.  Son with him. Resp: clear to auscultation bilaterally GI: Soft.  Nontender.  Still distended.  Still tympanitic.  Hypoactive bowel sounds.  JP drainage serosanguineous, odorless, moderate volume.  Wicks removed with midline wound.  No signs of infection.    Lab Results:  Results for orders placed or performed during the hospital encounter of 04/19/15 (from the past 24 hour(s))  CBC     Status: Abnormal   Collection Time: 04/23/15  4:52 AM  Result Value Ref Range   WBC 9.0 4.0 - 10.5 K/uL   RBC 4.08 (L) 4.22 - 5.81 MIL/uL   Hemoglobin 12.2 (L) 13.0 - 17.0 g/dL   HCT 36.8 (L) 39.0 - 52.0 %   MCV 90.2 78.0 - 100.0 fL   MCH 29.9 26.0 - 34.0 pg   MCHC 33.2 30.0 - 36.0 g/dL   RDW 14.4 11.5 - 15.5 %   Platelets 165 150 - 400 K/uL  Basic metabolic panel     Status: Abnormal   Collection Time: 04/23/15  4:52 AM  Result Value Ref Range   Sodium 138 135 - 145 mmol/L   Potassium  4.0 3.5 - 5.1 mmol/L   Chloride 109 101 - 111 mmol/L   CO2 21 (L) 22 - 32 mmol/L   Glucose, Bld 109 (H) 65 - 99 mg/dL   BUN 15 6 - 20 mg/dL   Creatinine, Ser 0.82 0.61 - 1.24 mg/dL   Calcium 8.0 (L) 8.9 - 10.3 mg/dL   GFR calc non Af Amer >60 >60 mL/min   GFR calc Af Amer >60 >60 mL/min   Anion gap 8 5 - 15     Studies/Results: No results found.  Marland Kitchen amLODipine  2.5 mg Oral Daily  . cholecalciferol  2,000 Units Oral Daily  . enoxaparin (LOVENOX) injection  40 mg Subcutaneous Q24H  . feeding supplement  1 Container Oral TID BM  . losartan  100 mg Oral Daily  . pantoprazole (PROTONIX) IV  40 mg Intravenous Q12H     Assessment/Plan: s/p Procedure(s): LAPAROSCOPIC ASSISTED  LOW ANTERIOR RESECTION, splenic flexure mobilization  POD #4. Laparoscopic assisted low anterior resection and takedown splenic flexure. Possible recent flare--discontinue antibiotics now Postop ileus, hopefully this will resolve over the next day or 2 Advance to full liquids.  No solid food until distention improves. Add toradol to hopefully reduce narcotic effect on gut. Discontinue fentanyl. entereg discontinued since he has had bowel movements. Incentive spirometry Mobilize Lovenox for DVT prophylaxis  We will ask PT and OT to assess today in anticipation of discharge in the next few days.  He is deconditioned.  Hypertension-back on his Norvasc and Cozaar. Lopressor on standby. Seems well controlled  History C. difficile colitis requiring fecal transplant.-- short course of antibiotics because of mild acute inflammation.  Discontinued yesterday  BPH-voiding without difficulty. Good output.  @PROBHOSP @  LOS: 4 days    Tatym Schermer M 04/23/2015  . .prob

## 2015-04-24 MED ORDER — SODIUM CHLORIDE 0.9 % IV BOLUS (SEPSIS): 500.0000 mL | Freq: Once | INTRAVENOUS | Status: DC

## 2015-04-24 MED ORDER — ACETAMINOPHEN 325 MG PO TABS: 650.0000 mg | ORAL_TABLET | ORAL | Status: DC | PRN

## 2015-04-24 MED ORDER — LOPERAMIDE HCL 2 MG PO CAPS
2.0000 mg | ORAL_CAPSULE | ORAL | Status: DC | PRN
  Administered 2015-04-24 (×3): 2 mg via ORAL
  Filled 2015-04-24 (×4): qty 1

## 2015-04-24 MED ORDER — ACETAMINOPHEN 500 MG PO TABS
1000.0000 mg | ORAL_TABLET | Freq: Four times a day (QID) | ORAL | Status: DC | PRN
  Administered 2015-04-24: 1000 mg via ORAL
  Filled 2015-04-24: qty 2

## 2015-04-24 MED ORDER — ACETAMINOPHEN 325 MG PO TABS
650.0000 mg | ORAL_TABLET | ORAL | Status: DC | PRN
  Administered 2015-04-24: 650 mg via ORAL
  Filled 2015-04-24: qty 2

## 2015-04-24 MED ORDER — SACCHAROMYCES BOULARDII 250 MG PO CAPS
250.0000 mg | ORAL_CAPSULE | Freq: Two times a day (BID) | ORAL | Status: DC
  Administered 2015-04-24 – 2015-04-27 (×7): 250 mg via ORAL
  Filled 2015-04-24 (×8): qty 1

## 2015-04-24 MED ORDER — KETOROLAC TROMETHAMINE 15 MG/ML IJ SOLN
15.0000 mg | Freq: Four times a day (QID) | INTRAMUSCULAR | Status: DC | PRN
  Administered 2015-04-24 – 2015-04-27 (×6): 15 mg via INTRAVENOUS
  Filled 2015-04-24 (×6): qty 1

## 2015-04-24 NOTE — Progress Notes (Signed)
OT Cancellation Note  Patient Details Name: Joel Bauer MRN: [id number] DOB: 10-17-1933   Cancelled Treatment:    Reason Eval/Treat Not Completed: Fatigue/lethargy limiting ability to participate  Will recheck on pt next day    Betsy Pries 04/24/2015, 2:46 PM

## 2015-04-24 NOTE — Progress Notes (Signed)
5 Days Post-Op LAR Subjective: Stable and alert. Slowly improving.  Walking a lot.  Nausea is intermittent. had 13 bowel movements yesterday.  C diff was neg Still feels distended. Lots of question and worries that he is not progressing as he should Doing ok on Toradol and Zofran.    Objective: Vital signs in last 24 hours: Temp:  [98.4 F (36.9 C)-99.6 F (37.6 C)] 98.4 F (36.9 C) (02/04 0532) Pulse Rate:  [90-95] 90 (02/04 0532) Resp:  [16] 16 (02/04 0532) BP: (148-158)/(67-76) 158/74 mmHg (02/04 0532) SpO2:  [94 %-95 %] 94 % (02/04 0532) Last BM Date: 04/23/15  Intake/Output from previous day: 02/03 0701 - 02/04 0700 In: 1440 [P.O.:240; I.V.:1200] Out: 810 [Drains:310; Stool:500] Intake/Output this shift:      EXAM: General appearance: Alert.  No physical distress.  Mental status good.  Son with him. Resp: clear to auscultation bilaterally GI: Soft.  Nontender.  Mildly distended. JP drainage serosanguineous, odorless, moderate volume.  Midline wound with clear drainage.  No signs of infection.    Lab Results:  Results for orders placed or performed during the hospital encounter of 04/19/15 (from the past 24 hour(s))  C difficile quick scan w PCR reflex     Status: None   Collection Time: 04/23/15  2:37 PM  Result Value Ref Range   C Diff antigen NEGATIVE NEGATIVE   C Diff toxin NEGATIVE NEGATIVE   C Diff interpretation Negative for toxigenic C. difficile      Studies/Results: No results found.  Marland Kitchen amLODipine  2.5 mg Oral Daily  . cholecalciferol  2,000 Units Oral Daily  . enoxaparin (LOVENOX) injection  40 mg Subcutaneous Q24H  . feeding supplement  1 Container Oral TID BM  . losartan  100 mg Oral Daily  . pantoprazole (PROTONIX) IV  40 mg Intravenous Q12H  . saccharomyces boulardii  250 mg Oral BID     Assessment/Plan: s/p Procedure(s): LAPAROSCOPIC ASSISTED  LOW ANTERIOR RESECTION, splenic flexure mobilization  POD #5. Laparoscopic assisted low  anterior resection and takedown splenic flexure. Postop ileus, seems to be resolved, but now with diarrhea Advance to soft foods as tolerated.   Cont toradol to hopefully reduce narcotic effect on gut. Imodium for diarrhea Incentive spirometry Mobilize Lovenox for DVT prophylaxis  Hypertension-back on his Norvasc and Cozaar. Lopressor on standby. Seems well controlled  History C. difficile colitis requiring fecal transplant.-- short course of antibiotics because of mild acute inflammation.  Discontinued yesterday.  C diff neg  BPH-voiding without difficulty. Good output.  @PROBHOSP @  LOS: 5 days    Atthew Coutant C. [id number]  . .prob

## 2015-04-25 LAB — CBC
HCT: 36.5 % — ABNORMAL LOW (ref 39.0–52.0)
HEMOGLOBIN: 12.4 g/dL — AB (ref 13.0–17.0)
MCH: 30.5 pg (ref 26.0–34.0)
MCHC: 34 g/dL (ref 30.0–36.0)
MCV: 89.7 fL (ref 78.0–100.0)
PLATELETS: 218 10*3/uL (ref 150–400)
RBC: 4.07 MIL/uL — ABNORMAL LOW (ref 4.22–5.81)
RDW: 14.4 % (ref 11.5–15.5)
WBC: 7.2 10*3/uL (ref 4.0–10.5)

## 2015-04-25 LAB — BASIC METABOLIC PANEL
Anion gap: 7 (ref 5–15)
BUN: 10 mg/dL (ref 6–20)
CALCIUM: 8.2 mg/dL — AB (ref 8.9–10.3)
CO2: 26 mmol/L (ref 22–32)
CREATININE: 0.81 mg/dL (ref 0.61–1.24)
Chloride: 107 mmol/L (ref 101–111)
Glucose, Bld: 108 mg/dL — ABNORMAL HIGH (ref 65–99)
Potassium: 3.6 mmol/L (ref 3.5–5.1)
SODIUM: 140 mmol/L (ref 135–145)

## 2015-04-25 MED ORDER — LOPERAMIDE HCL 2 MG PO CAPS
4.0000 mg | ORAL_CAPSULE | Freq: Three times a day (TID) | ORAL | Status: DC
  Administered 2015-04-25 (×3): 4 mg via ORAL
  Filled 2015-04-25 (×7): qty 2

## 2015-04-25 MED ORDER — HYDROCORTISONE 2.5 % RE CREA
TOPICAL_CREAM | RECTAL | Status: DC | PRN
  Administered 2015-04-25: 19:00:00 via RECTAL
  Filled 2015-04-25: qty 28.35

## 2015-04-25 MED ORDER — PANTOPRAZOLE SODIUM 40 MG PO TBEC
40.0000 mg | DELAYED_RELEASE_TABLET | Freq: Two times a day (BID) | ORAL | Status: DC
  Administered 2015-04-25 – 2015-04-27 (×4): 40 mg via ORAL
  Filled 2015-04-25 (×5): qty 1

## 2015-04-25 MED ORDER — CALCIUM POLYCARBOPHIL 625 MG PO TABS
625.0000 mg | ORAL_TABLET | Freq: Every day | ORAL | Status: DC
  Administered 2015-04-25 – 2015-04-27 (×3): 625 mg via ORAL
  Filled 2015-04-25 (×3): qty 1

## 2015-04-25 MED ORDER — FERROUS SULFATE 325 (65 FE) MG PO TABS
325.0000 mg | ORAL_TABLET | Freq: Two times a day (BID) | ORAL | Status: DC
  Administered 2015-04-25 (×2): 325 mg via ORAL
  Filled 2015-04-25 (×5): qty 1

## 2015-04-25 NOTE — Progress Notes (Addendum)
6 Days Post-Op LAR Subjective: Stable and alert. Slowly improving.  Walking a lot.  Nausea is intermittent. had 10 bowel movements yesterday.  C diff was neg Still feels distended and bloated.   Objective: Vital signs in last 24 hours: Temp:  [97.9 F (36.6 C)-99.3 F (37.4 C)] 98.2 F (36.8 C) (02/05 0522) Pulse Rate:  [73-85] 73 (02/05 0522) Resp:  [16] 16 (02/05 0522) BP: (146-164)/(68-76) 161/75 mmHg (02/05 0522) SpO2:  [93 %-96 %] 94 % (02/05 0522) Last BM Date: 04/24/15  Intake/Output from previous day: 02/04 0701 - 02/05 0700 In: 2400 [I.V.:2400] Out: 635 [Urine:200; Drains:435] Intake/Output this shift:      EXAM: General appearance: Alert.  No physical distress.  Mental status good.  Son with him. Resp: clear to auscultation bilaterally GI: Soft.  Nontender.  Mildly distended. JP drainage serous, moderate volume.  Midline wound with clear drainage.  No signs of infection.    Lab Results:  Results for orders placed or performed during the hospital encounter of 04/19/15 (from the past 24 hour(s))  CBC     Status: Abnormal   Collection Time: 04/25/15  5:30 AM  Result Value Ref Range   WBC 7.2 4.0 - 10.5 K/uL   RBC 4.07 (L) 4.22 - 5.81 MIL/uL   Hemoglobin 12.4 (L) 13.0 - 17.0 g/dL   HCT 36.5 (L) 39.0 - 52.0 %   MCV 89.7 78.0 - 100.0 fL   MCH 30.5 26.0 - 34.0 pg   MCHC 34.0 30.0 - 36.0 g/dL   RDW 14.4 11.5 - 15.5 %   Platelets 218 150 - 400 K/uL  Basic metabolic panel     Status: Abnormal   Collection Time: 04/25/15  5:30 AM  Result Value Ref Range   Sodium 140 135 - 145 mmol/L   Potassium 3.6 3.5 - 5.1 mmol/L   Chloride 107 101 - 111 mmol/L   CO2 26 22 - 32 mmol/L   Glucose, Bld 108 (H) 65 - 99 mg/dL   BUN 10 6 - 20 mg/dL   Creatinine, Ser 0.81 0.61 - 1.24 mg/dL   Calcium 8.2 (L) 8.9 - 10.3 mg/dL   GFR calc non Af Amer >60 >60 mL/min   GFR calc Af Amer >60 >60 mL/min   Anion gap 7 5 - 15     Studies/Results: No results found.  Marland Kitchen amLODipine   2.5 mg Oral Daily  . cholecalciferol  2,000 Units Oral Daily  . enoxaparin (LOVENOX) injection  40 mg Subcutaneous Q24H  . feeding supplement  1 Container Oral TID BM  . ferrous sulfate  325 mg Oral BID WC  . loperamide  4 mg Oral TID AC & HS  . losartan  100 mg Oral Daily  . pantoprazole (PROTONIX) IV  40 mg Intravenous Q12H  . polycarbophil  625 mg Oral Daily  . saccharomyces boulardii  250 mg Oral BID     Assessment/Plan: s/p Procedure(s): LAPAROSCOPIC ASSISTED  LOW ANTERIOR RESECTION, splenic flexure mobilization  POD #6. Laparoscopic assisted low anterior resection and takedown splenic flexure. Postop ileus, seems to be resolved, but now with diarrhea Cont soft foods as tolerated.   Cont toradol to hopefully reduce narcotic effect on gut. Imodium for diarrhea.  Will schedule this and add fiber and iron to slow bowel motility  Incentive spirometry Lovenox for DVT prophylaxis  Hypertension-back on his Norvasc and Cozaar. Lopressor on standby. Seems well controlled  History C. difficile colitis requiring fecal transplant.-- short course of antibiotics because of  mild acute inflammation.  Discontinued yesterday.  C diff neg.  Pt placed on probiotics  BPH-voiding without difficulty. Good output.  @PROBHOSP @  LOS: 6 days    Meyer Dockery C. [id number]  . .prob

## 2015-04-25 NOTE — Progress Notes (Signed)
OT Cancellation Note  Patient Details Name: Joel Bauer MRN: [id number] DOB: Jul 19, 1933   Cancelled Treatment:    Reason Eval/Treat Not Completed: Fatigue/lethargy limiting ability to participate. OT evaluation attempted. Patient sleeping soundly. Family present in room states that patient has been up walking today. They request OT check back another time. Will follow up for OT evaluation tomorrow.  Jeffry Vogelsang A 04/25/2015, 11:19 AM

## 2015-04-25 NOTE — Progress Notes (Signed)
The patient is receiving Protonix by the intravenous route.  Based on criteria approved by the Pharmacy and [place], the medication is being converted to the equivalent oral dose form.  These criteria include: -No Active GI bleeding -Able to tolerate diet of full liquids (or better) or tube feeding -Able to tolerate other medications by the oral or enteral route  If you have any questions about this conversion, please contact the Pharmacy Department (phone [phone]).  Thank you.  Minda Ditto PharmD Pager [phone] 04/25/2015, 1:03 PM

## 2015-04-25 NOTE — Progress Notes (Signed)
Patient complained of hemorrhoids and bleeding. MD call and notified. REcieved verbal order for Anusol rectal cream PRN.

## 2015-04-26 LAB — CREATININE, SERUM
CREATININE: 0.72 mg/dL (ref 0.61–1.24)
GFR calc Af Amer: >60 mL/min (ref >60)

## 2015-04-26 MED ORDER — LOPERAMIDE HCL 2 MG PO CAPS: 4.0000 mg | ORAL_CAPSULE | Freq: Three times a day (TID) | ORAL | Status: DC | PRN

## 2015-04-26 MED ORDER — FERROUS SULFATE 325 (65 FE) MG PO TABS
325.0000 mg | ORAL_TABLET | Freq: Every day | ORAL | Status: DC
  Administered 2015-04-26 – 2015-04-27 (×2): 325 mg via ORAL
  Filled 2015-04-26 (×3): qty 1

## 2015-04-26 NOTE — Care Management Important Message (Signed)
Important Message  Patient Details  Name: Joel Bauer MRN: [id number] Date of Birth: March 11, 1934   Medicare Important Message Given:  Yes    Camillo Flaming 04/26/2015, 12:07 [place] Message  Patient Details  Name: Joel Bauer MRN: [id number] Date of Birth: 01/29/1934   Medicare Important Message Given:  Yes    Camillo Flaming 04/26/2015, 12:06 PM

## 2015-04-26 NOTE — Progress Notes (Signed)
7 Days Post-Op  Subjective: Progressively improving.  Afebrile.  Normotensive.  Heart rate 72.  Decrease nausea.  Tolerating soft diet.  Decreased pain.  Diarrhea much less. Taking probiotics, iron, and Imodium.  C. difficile negative.  Only 3 stools recorded yesterday. Labs yesterday look good.  Hemoglobin 12.4.  WBC 7200.  Potassium 3.6.  Creatinine 0.81.  Glucose 108.  Objective: Vital signs in last 24 hours: Temp:  [98.1 F (36.7 C)-98.3 F (36.8 C)] 98.2 F (36.8 C) (02/06 0435) Pulse Rate:  [72-78] 72 (02/06 0435) Resp:  [16-18] 16 (02/06 0435) BP: (138-158)/(59-71) 138/59 mmHg (02/06 0435) SpO2:  [93 %-95 %] 93 % (02/06 0435) Last BM Date: 04/25/15  Intake/Output from previous day: 02/05 0701 - 02/06 0700 In: 1572.5 [I.V.:1572.5] Out: 165 [Drains:165] Intake/Output this shift: Total I/O In: 825 [I.V.:825] Out: 100 [Drains:100]  General appearance: Looks better.  More comfortable.  Less anxious.  Relaxed.  No distress. GI: Soft.  Nontender.  Less distention.  All wounds look good.  JP drainage odorless, serosanguineous.  Lab Results:  No results found for this or any previous visit (from the past 24 hour(s)).   Studies/Results: No results found.  Marland Kitchen amLODipine  2.5 mg Oral Daily  . cholecalciferol  2,000 Units Oral Daily  . enoxaparin (LOVENOX) injection  40 mg Subcutaneous Q24H  . feeding supplement  1 Container Oral TID BM  . ferrous sulfate  325 mg Oral Q breakfast  . losartan  100 mg Oral Daily  . pantoprazole  40 mg Oral BID  . polycarbophil  625 mg Oral Daily  . saccharomyces boulardii  250 mg Oral BID     Assessment/Plan: s/p Procedure(s): LAPAROSCOPIC ASSISTED  LOW ANTERIOR RESECTION, splenic flexure mobilization  POD #7. Laparoscopic assisted low anterior resection and takedown splenic flexure. Postop ileus resolving.  Continue soft diet   Cont toradol to hopefully reduce narcotic effect on gut. Imodium for diarrhea. Since diarrhea slowing  down will make Imodium when necessary but continue fiber, probiotics, and reduce ferrous sulfate to once a day. Remove JP drain Reduce IV fluid support Lovenox for DVT prophylaxis Plan discharge tomorrow.  Hypertension-back on his Norvasc and Cozaar. Lopressor on standby. Seems well controlled  History C. difficile colitis requiring fecal transplant.-- short course of antibiotics because of mild acute inflammation. Discontinued POPD#3. C diff neg. Pt placed on probiotics  BPH-voiding without difficulty. Good output.  @PROBHOSP @  LOS: 7 days    Shaheim Mahar M 04/26/2015  . .prob

## 2015-04-26 NOTE — Evaluation (Signed)
  Occupational Therapy Evaluation Patient Details Name: POSIE JEFCOAT MRN: [id number] DOB: [birth year] Today's Date: 05/13/2015    History of Present Illness Pt s/p Laparoscopic assised lower anterior resections with coloproctostomy 2* diverticulitis.   Clinical Impression   Pt overall mod I with ADL activity.  Sister will obtain pt a reacher.  No further OT needed    Follow Up Recommendations  No OT follow up    Equipment Recommendations  None recommended by OT    Recommendations for Other Services       Precautions / Restrictions Restrictions Weight Bearing Restrictions: No      Mobility Bed Mobility Overal bed mobility: Needs Assistance;Modified Independent       Supine to sit: Min assist        Transfers Overall transfer level: Needs assistance               General transfer comment: cues for use of UEs to self assist         ADL Overall ADL's : Needs assistance/impaired                     Lower Body Dressing: Minimal assistance;Sit to/from stand                 General ADL Comments: Pt overall mod I with with ADL activity. OT did identify picking items off floor challenging for pt. Pt would benefit from a reacher.  Educated pt and pts sister on use of a Secondary school teacher. Sister will obtain.  Also discussed fall prevention in the shower and reccomended a non skid mat in and outside of shower.               Pertinent Vitals/Pain Pain Score: 3  Pain Location: abdomen area Pain Intervention(s): Monitored during session     Hand Dominance     Extremity/Trunk Assessment Upper Extremity Assessment Upper Extremity Assessment: Overall WFL for tasks assessed           Communication Communication Communication: No difficulties   Cognition Arousal/Alertness: Awake/alert Behavior During Therapy: WFL for tasks assessed/performed Overall Cognitive Status: Within Functional Limits for tasks assessed                               Home Living Family/patient expects to be discharged to:: Private residence Living Arrangements: Alone Available Help at Discharge: Family Type of Home: House Home Access: Stairs to enter Technical brewer of Steps: 3 Entrance Stairs-Rails: None Home Layout: One level     Bathroom Shower/Tub: Occupational psychologist: Standard     Home Equipment: None   Additional Comments: Sister and son will be assisting      Prior Functioning/Environment Level of Independence: Independent                      OT Goals(Current goals can be found in the care plan section) Acute Rehab OT Goals Patient Stated Goal: Resume previous active lifestyle asap  OT Frequency:                End of Session  in bed with sister present  Activity Tolerance: Patient tolerated treatment well   Time: [id number] OT Time Calculation (min): 16 min Charges:  OT General Charges $OT Visit: 1 Procedure OT Evaluation $OT Eval Low Complexity: 1 Procedure G-Codes:    Payton Mccallum D 13-May-2015, 11:46 AM

## 2015-04-26 NOTE — Progress Notes (Addendum)
PT Cancellation Note  Patient Details Name: Joel Bauer MRN: [id number] DOB: [birth year]   Cancelled Treatment:    Reason Eval/Treat Not Completed: Other (comment) (Observed pt ambulating in hall 400' with RW independently, no LOB. He reports he's independent with bed mobility and transfers and has been walking in halls 5-6x/day. He stated he will be able to go up 4 steps to enter home without difficulty.  No further PT needs, pt has met PT goals, will sign off. )   Philomena Doheny 04/26/2015, 10:27 AM [phone]

## 2015-04-27 NOTE — Discharge Summary (Signed)
Patient ID: Joel Bauer [id number] 82 y.o. 1933-03-23  Admit date: 04/19/2015  Discharge date and time: 04/27/2015  Admitting Physician: Adin Hector  Discharge Physician: Adin Hector  Admission Diagnoses: diverticulitis     Discharge Diagnoses: Recurrent diverticulitis                                         History C. difficile colitis requiring fecal transplant                                         BPH                                         Hypertension  Operations: Procedure(s): LAPAROSCOPIC ASSISTED  LOW ANTERIOR RESECTION, splenic flexure mobilization  Admission Condition: good  Discharged Condition: good  Indication for Admission: The patient is a 82 year old male who presents with diverticulitis. He is brought to the hospital electively having undergone bowel prep yesterday. It is notable that he called me 3 days prior to admission with left lower quadrant pain. We put him on broad-spectrum antibiotics and the pain resolved although he states he still little bit tender. On exam the morning of admission he is minimally tender and there is no mass.  Single column barium enema was performed on January 20, 2015. This shows severe rectosigmoid diverticulosis and limited assessment of that region. Some mucosal edema. They could not rule out neoplasm because of the severity of the diverticulosis. The rest of the colon looked fine. I told him we were not very concerned about neoplasia since Dr. Carlean Purl had performed colonoscopy 6 months ago.  His diverticulitis goes back to December 28, 2012 at which time a CT scan showed uncomplicated diverticulitis. He's been treated off and on since then. 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 Carlean Purl about 6 months ago. The C. difficile colitis has not recurred. He's had a couple of episodes of mild diverticulitis since then that have been treated as an  outpatient.  Hospital Course: On the day of admission the patient was taken to the operating room and underwent a laparoscopic-assisted low anterior resection, takedown of splenic flexure, and 29 mm EEA stapled anastomosis.  There were fairly extensive adhesions which we were able to slowly take down and technically the surgery went well.     Operative findings revealed He had significant adhesions. The sigmoid colon and left colon were densely adherent to the anterior lateral abdominal wall. Most of these adhesions were chronic. There was a somewhat of an acute inflammatory reaction but there is no purulence or perforation or abscess. There was chronic adhesions in the upper abdomen as well with omentum up to the anterior abdominal wall suggesting prior infectious complications. We were able to resect the obviously diseased segment of sigmoid colon and create an anastomosis between the distal descending colon and the mid rectum above the level of the peritoneal reflection using the stapling device. We had to take down the splenic flexure extensively to get this done but the anastomosis looked good, no tension, good blood supply, no airleak.     Postoperatively the patient was stable and without any major  complication but his progress was slow.  He had a somewhat prolonged ileus manifest by abdominal distention, with ileus pattern confirmed by x-ray.  Foley catheter was removed on postop day 2 and he voided without difficulty.  He began ambulation.  As his ileus resolved to developed profound diarrhea.  C. difficile was negative.  With slow advancement of diet, Imodium, probiotics, fiber, and ferrous sulfate this improved and he became much more comfortable.     JP drainage was removed on postop day #7 and was serosanguineous.  Lab work on February 5 revealed hemoglobin 12.4, WBC 7200, creatinine 0.81, potassium 3.6.     The patient progressed to the point where he was quite comfortable and ready to go  home.  On the day of discharge his abdomen was soft and the distention had resolved.  It was nontender.  All the wounds looked good.  We left the staples in place.  He was ambulating independently, was sometimes using a walker.     No prescriptions were written.  He has Anusol HC cream for hemorrhoidal sensitivity, Imodium, antibiotics at home.  He also has hydrocodone at home but we advise using Tylenol instead.  He was advised to take fiber supplements such as Metamucil or FiberCon twice a day.  Hydration was emphasized.  Low-fat heart healthy diet with some fiber was advised.  I asked him to return to see me in the office in one week to get the staples out.  I discussed diet and activities and return to golfing in detail.  Consults: None  Significant Diagnostic Studies: Surgical pathology, revealing diverticulosis and diverticulitis  Treatments: surgery: Laparoscopic-assisted low anterior resection and takedown splenic flexure  Disposition: Home  Patient Instructions:    Medication List    TAKE these medications        amLODipine 2.5 MG tablet  Commonly known as:  NORVASC  Take 1 tablet (2.5 mg total) by mouth daily.     CALCIUM PO  Take 1 tablet by mouth daily.     COQ10 PO  Take 100 mg by mouth daily.     EYE VITAMINS PO  Take 1 tablet by mouth daily.     fluticasone 50 MCG/ACT nasal spray  Commonly known as:  FLONASE  Place 1 spray into both nostrils daily as needed for allergies or rhinitis.     HYDROcodone-acetaminophen 5-325 MG tablet  Commonly known as:  NORCO/VICODIN  Take 1 tablet by mouth every 6 (six) hours as needed for moderate pain.     hydroxypropyl methylcellulose / hypromellose 2.5 % ophthalmic solution  Commonly known as:  ISOPTO TEARS / GONIOVISC  Place 1 drop into both eyes 3 (three) times daily as needed for dry eyes.     ibuprofen 200 MG tablet  Commonly known as:  ADVIL,MOTRIN  Take 200-400 mg by mouth every 6 (six) hours as needed for moderate  pain.     KRILL OIL PO  Take 2 tablets by mouth daily.     losartan 100 MG tablet  Commonly known as:  COZAAR  Take 1 tablet (100 mg total) by mouth daily.     LUTEIN PO  Take 25 mg by mouth daily.     saccharomyces boulardii 250 MG capsule  Commonly known as:  FLORASTOR  Take 250 mg by mouth daily.     traMADol 50 MG tablet  Commonly known as:  ULTRAM  Take 50 mg by mouth every 6 (six) hours as needed for moderate pain.  Vitamin D3 2000 units Tabs  Take 1 tablet by mouth daily.        Activity: Frequent ambulation.  No sports or heavy lifting for 5-6 weeks.  No driving for 2 weeks. Diet: low fat, low cholesterol diet Wound Care: as directed  Follow-up:  With Dr. Dalbert Batman in 1 week.  Signed: Edsel Petrin. Dalbert Batman, M.D., FACS General and minimally invasive surgery Breast and Colorectal Surgery  04/27/2015, 5:59 AM

## 2015-04-27 NOTE — Progress Notes (Signed)
discharge instructions discussed with Patient ,son and sister Until no further questions ask. Patient able to answer questions about when to call md. IV discontinued and supplies given for dressing over old JP site.

## 2015-06-10 ENCOUNTER — Ambulatory Visit (INDEPENDENT_AMBULATORY_CARE_PROVIDER_SITE_OTHER): Payer: Medicare Other | Admitting: Cardiology

## 2015-06-10 ENCOUNTER — Encounter: Payer: Self-pay | Admitting: Cardiology

## 2015-06-10 VITALS — BP 134/66 | HR 76 | Ht 71.0 in | Wt 176.0 lb

## 2015-06-10 DIAGNOSIS — R072 Precordial pain: Secondary | ICD-10-CM | POA: Diagnosis not present

## 2015-06-10 DIAGNOSIS — I119 Hypertensive heart disease without heart failure: Secondary | ICD-10-CM | POA: Diagnosis not present

## 2015-06-10 NOTE — Patient Instructions (Signed)
Your physician recommends that you continue on your current medications as directed. Please refer to the Current Medication list given to you today.     Your physician wants you to follow-up in: 6 MONTHS WITH DR NELSON You will receive a reminder letter in the mail two months in advance. If you don't receive a letter, please call our office to schedule the follow-up appointment.  

## 2015-06-10 NOTE — Progress Notes (Signed)
Patient ID: Joel Bauer, male   DOB: 1934-02-16, 82 y.o.   MRN: [id number]      Cardiology Office Note - Consultation  Date:  06/10/2015   ID:  Joel Bauer, DOB 23-Dec-1933, MRN [id number]  PCP:  Odette Fraction, MD  Cardiologist:   Dorothy Spark, MD  Referring physician: Fanny Skates, MD  No chief complaint on file.  Chief complain: chest pain, preoperative evaluation   History of Present Illness: Joel Bauer is a 82 y.o. male who presents for evaluation of chest pain. The patient has h/o hypertension, hyperlipidemia and chronic diverticulitis, C.diff in the past, scheduled for a laparoscopic assisted low anterior resection this Friday 04/02/2015. He presented to the ER on 03/22/2015 with sudden onset chest pain, that woke him up at night and was sharp. He had a similar episode in the past. He is otherwise very active, playing tennis several times a week and hasn't noticed any chest pain, DOE, palpitaions, syncope or claudications.  His mother had CHF in her 83', died at age 35. No other family members being diagnosed with heart problems.   06/10/2015 - the patient underwent laparoscopic partial colectomy and has lost 20 pounds feeling very short period of time. He also developed acute sudden hearing loss in his right ear and was diagnosed with Mnire's disease, he was started on prednisone and is experiencing significant side effects including insomnia and tremor. He otherwise denies any chest pain, palpitations, dyspnea on exertion. No lower extremity edema or proximal nocturnal dyspnea. He says that he is experiencing orthostatic hypotension. No syncope or falls.  Past Medical History  Diagnosis Date  . Adenomatous colon polyp 2001  . Diverticulosis of colon (without mention of hemorrhage) 2005  . Hypertension   . Hyperlipidemia   . Arthritis   . Shingles   . Pleurisy   . Prostatitis   . Retinal hemorrhage   . Heart murmur   . History of skin cancer   . GERD  (gastroesophageal reflux disease)   . Hx of Clostridium difficile infection     3 MONTHS AGO  . History of kidney stones   . 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 Diverticulitis of colon - recurrent/persistent 12/29/2014  . Anemia     as a child   . Cancer (HCC)     hx of skin cancer   . Macular degeneration     left eye    Past Surgical History  Procedure Laterality Date  . Lithotripsy    . Tonsillectomy    . Cataracts removed    . Cystoscopy with retrograde pyelogram, ureteroscopy and stent placement Right 10/18/2013    Procedure: CYSTOSCOPY WITH RIGHT RETROGRADE/RIGHT URETEROSCOPY, STONE BASKETRY,  AND STENT PLACEMENT RIGHT;  Surgeon: Alexis Frock, MD;  Location: [facility];  Service: Urology;  Laterality: Right;  . Colonoscopy    . Colonoscopy N/A 06/15/2014    Procedure: COLONOSCOPY;  Surgeon: Gatha Mayer, MD;  Location: [facility];  Service: Endoscopy;  Laterality: N/A;  . Fmt  06/2014  . Eye surgery      3 injections to left eye for macular degeneration   . Laparoscopic low anterior resection N/A 04/19/2015    Procedure: LAPAROSCOPIC ASSISTED  LOW ANTERIOR RESECTION, splenic flexure mobilization;  Surgeon: Fanny Skates, MD;  Location: [facility];  Service: General;  Laterality: N/A;     Current Outpatient Prescriptions  Medication Sig Dispense Refill  . amLODipine (NORVASC) 2.5 MG tablet Take 1 tablet (2.5  mg total) by mouth daily. (Patient taking differently: Take 2.5 mg by mouth daily. Patient takes in the pm) 90 tablet 3  . CALCIUM PO Take 1 tablet by mouth daily.    . Cholecalciferol (VITAMIN D3) 2000 UNITS TABS Take 1 tablet by mouth daily.    . Coenzyme Q10 (COQ10 PO) Take 100 mg by mouth daily.     . fluticasone (FLONASE) 50 MCG/ACT nasal spray Place 1 spray into both nostrils daily as needed for allergies or rhinitis.    Marland Kitchen HYDROcodone-acetaminophen (NORCO/VICODIN) 5-325 MG tablet Take 1 tablet by mouth every 6 (six) hours as needed for moderate pain.      . hydroxypropyl methylcellulose / hypromellose (ISOPTO TEARS / GONIOVISC) 2.5 % ophthalmic solution Place 1 drop into both eyes 3 (three) times daily as needed for dry eyes.    Marland Kitchen ibuprofen (ADVIL,MOTRIN) 200 MG tablet Take 200-400 mg by mouth every 6 (six) hours as needed for moderate pain.     Marland Kitchen KRILL OIL PO Take 2 tablets by mouth daily.    Marland Kitchen losartan (COZAAR) 100 MG tablet Take 1 tablet (100 mg total) by mouth daily. 90 tablet 1  . LUTEIN PO Take 25 mg by mouth daily.     . Multiple Vitamins-Minerals (EYE VITAMINS PO) Take 1 tablet by mouth daily.    Marland Kitchen saccharomyces boulardii (FLORASTOR) 250 MG capsule Take 250 mg by mouth daily.    . traMADol (ULTRAM) 50 MG tablet Take 50 mg by mouth every 6 (six) hours as needed for moderate pain.      No current facility-administered medications for this visit.    Allergies:   Review of patient's allergies indicates no known allergies.    Social History:  The patient  reports that he has never smoked. He has never used smokeless tobacco. He reports that he does not drink alcohol or use illicit drugs.   Family History:  The patient's family history includes Breast cancer in his mother; Colon cancer in his father; Colon polyps in his father; Heart disease in his mother; Kidney disease in his sister.    ROS:  Please see the history of present illness.   Otherwise, review of systems are positive for none.   All other systems are reviewed and negative.    PHYSICAL EXAM: VS:  BP 134/66 mmHg  Pulse 76  Ht 5\' 11"  (1.803 m)  Wt 176 lb (79.833 kg)  BMI 24.56 kg/m2 , BMI Body mass index is 24.56 kg/(m^2). GEN: Well nourished, well developed, in no acute distress HEENT: normal Neck: no JVD, carotid bruits, or masses Cardiac: RRR; 2/6 systolic murmur, rubs, or gallops,no edema  Respiratory:  clear to auscultation bilaterally, normal work of breathing GI: soft, nontender, nondistended, + BS MS: no deformity or atrophy Skin: warm and dry, no rash Neuro:   Strength and sensation are intact Psych: euthymic mood, full affect  EKG:  EKG is ordered today. The ekg ordered today demonstrates R, normal ECG  Recent Labs: 04/21/2015: ALT 25 04/25/2015: BUN 10; Hemoglobin 12.4*; Platelets 218; Potassium 3.6; Sodium 140 04/26/2015: Creatinine, Ser 0.72   Lipid Panel No results found for: CHOL, TRIG, HDL, CHOLHDL, VLDL, LDLCALC, LDLDIRECT   Wt Readings from Last 3 Encounters:  06/10/15 176 lb (79.833 kg)  04/26/15 169 lb 8 oz (76.885 kg)  04/14/15 175 lb (79.379 kg)    Exercise nuclear stress test: 04/12/2015  Nuclear stress EF: 61%.  Blood pressure demonstrated a hypertensive response to exercise.  There was no  ST segment deviation noted during stress.  The study is normal.  This is a low risk study.  The left ventricular ejection fraction is normal (55-65%).  TTE: 04/12/2015 - Left ventricle: The cavity size was normal. Wall thickness was  increased in a pattern of mild LVH. Systolic function was normal.  The estimated ejection fraction was in the range of 60% to 65%.  Wall motion was normal; there were no regional wall motion  abnormalities. Doppler parameters are consistent with abnormal  left ventricular relaxation (grade 1 diastolic dysfunction). The  E/e&' ratio is <8, suggesting normal LV filling pressure. - Aortic valve: Trileaflet. Sclerosis without stenosis. There was  mild regurgitation. - Mitral valve: Mildly thickened leaflets . There was trivial  regurgitation. - Left atrium: The atrium was normal in size. - Tricuspid valve: There was trivial regurgitation. - Pulmonary arteries: PA peak pressure: 25 mm Hg (S). - Systemic veins: The IVC Was not visualized.  Impressions: - LVEF 65-70%, mild LVH, normal wall motion, diastolic dysfunction  with normal LV filling pressure, aortic sclerosis with mild AI   ASSESSMENT AND PLAN:  1.  Chest pain - atypical, however has risk factors - including age, sex, HTN, HLP,  he underwent an exercise nuclear stress test that was normal, but had hypertensive response. He was started on amlodipine 2.5 mg po daily.   2. Hypertensive heart disease without CHF - mild concentric LVH on echocardiogram and hypertensive response to exertion on stress test. He was started on amlodipine 2.5 mg daily. He has lost 20 pounds postsurgery and was not able to 8 and drink as much as before, he has been experiencing orthostatic hypotension, therefore he is advised to hold amlodipine for few days and recheck his blood pressure if it continues rise to 1 6170 he should restart it otherwise stay away from it.  Follow up in 6 months.  No orders of the defined types were placed in this encounter.   Follow up in 3 months.  Signed, Dorothy Spark, MD  06/10/2015 10:23 AM    [place], [place],   [address] Phone: [phone]; Fax: [phone]

## 2015-06-22 ENCOUNTER — Encounter: Payer: Self-pay | Admitting: Family Medicine

## 2015-06-22 ENCOUNTER — Ambulatory Visit (INDEPENDENT_AMBULATORY_CARE_PROVIDER_SITE_OTHER): Payer: Medicare Other | Admitting: Family Medicine

## 2015-06-22 ENCOUNTER — Encounter (INDEPENDENT_AMBULATORY_CARE_PROVIDER_SITE_OTHER): Payer: Medicare Other | Admitting: Family Medicine

## 2015-06-22 VITALS — BP 142/76 | HR 78 | Temp 97.6°F | Resp 16 | Ht 71.0 in | Wt 163.0 lb

## 2015-06-22 DIAGNOSIS — R002 Palpitations: Secondary | ICD-10-CM | POA: Diagnosis not present

## 2015-06-22 DIAGNOSIS — R634 Abnormal weight loss: Secondary | ICD-10-CM | POA: Diagnosis not present

## 2015-06-22 DIAGNOSIS — G250 Essential tremor: Secondary | ICD-10-CM | POA: Diagnosis not present

## 2015-06-22 LAB — CBC WITH DIFFERENTIAL/PLATELET
BASOS ABS: 48 {cells}/uL (ref 0–200)
Basophils Relative: 1 %
EOS ABS: 144 {cells}/uL (ref 15–500)
Eosinophils Relative: 3 %
HCT: 41 % (ref 38.5–50.0)
Hemoglobin: 13.7 g/dL (ref 13.0–17.0)
LYMPHS PCT: 31 %
Lymphs Abs: 1488 cells/uL (ref 850–3900)
MCH: 29.6 pg (ref 27.0–33.0)
MCHC: 33.4 g/dL (ref 32.0–36.0)
MCV: 88.6 fL (ref 80.0–100.0)
MONOS PCT: 5 %
MPV: 9.8 fL (ref 7.5–12.5)
Monocytes Absolute: 240 cells/uL (ref 200–950)
Neutro Abs: 2880 cells/uL (ref 1500–7800)
Neutrophils Relative %: 60 %
PLATELETS: 133 10*3/uL — AB (ref 140–400)
RBC: 4.63 MIL/uL (ref 4.20–5.80)
RDW: 15.1 % — AB (ref 11.0–15.0)
WBC: 4.8 10*3/uL (ref 3.8–10.8)

## 2015-06-22 NOTE — Progress Notes (Signed)
Subjective:    Patient ID: Joel Bauer, male    DOB: 1934-03-10, 82 y.o.   MRN: [id number]  HPI Patient is a very pleasant 82 year old white male who was recently admitted to the hospital in February for recurrent bouts of diverticulitis. He underwent a laparoscopic partial colectomy due to recurrent diverticulitis. The patient lost approximately 20 pounds of weight during that time. Since discharge from the hospital, his blood pressure at home has been averaging between 123456 systolic over 70 diastolic.  He also reports frequent palpitations. He will have a normal sinus rhythm and then a pronounced abnormal beat followed by a brief Pauls. This sounds like a PVC. On examination today he is in normal sinus rhythm with no auscultated PVCs or PACs. These palpitations have been worse since the weight loss. It also seems to been compounded by the fact the patient has been on prednisone for last 12 days due to Mnire's disease. He also reports worsening of an essential tremor. He has a very fine repetitive tremor in both arms right greater than left. This worsens with focus and attention and activity. He has a normal gait. He does not have a shuffling gait. He is not falling. He denies any memory loss. There is no evidence of bradykinesia. He does not have a mask like facies. He demonstrates no other signs or symptoms of Parkinson's disease. Past Medical History  Diagnosis Date  . Adenomatous colon polyp 2001  . Diverticulosis of colon (without mention of hemorrhage) 2005  . Hypertension   . Hyperlipidemia   . Arthritis   . Shingles   . Pleurisy   . Prostatitis   . Retinal hemorrhage   . Heart murmur   . History of skin cancer   . GERD (gastroesophageal reflux disease)   . Hx of Clostridium difficile infection     3 MONTHS AGO  . History of kidney stones   . 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 Diverticulitis of colon - recurrent/persistent 12/29/2014  . Anemia     as a  child   . Cancer (HCC)     hx of skin cancer   . Macular degeneration     left eye   Past Surgical History  Procedure Laterality Date  . Lithotripsy    . Tonsillectomy    . Cataracts removed    . Cystoscopy with retrograde pyelogram, ureteroscopy and stent placement Right 10/18/2013    Procedure: CYSTOSCOPY WITH RIGHT RETROGRADE/RIGHT URETEROSCOPY, STONE BASKETRY,  AND STENT PLACEMENT RIGHT;  Surgeon: Alexis Frock, MD;  Location: [facility];  Service: Urology;  Laterality: Right;  . Colonoscopy    . Colonoscopy N/A 06/15/2014    Procedure: COLONOSCOPY;  Surgeon: Gatha Mayer, MD;  Location: [facility];  Service: Endoscopy;  Laterality: N/A;  . Fmt  06/2014  . Eye surgery      3 injections to left eye for macular degeneration   . Laparoscopic low anterior resection N/A 04/19/2015    Procedure: LAPAROSCOPIC ASSISTED  LOW ANTERIOR RESECTION, splenic flexure mobilization;  Surgeon: Fanny Skates, MD;  Location: [facility];  Service: General;  Laterality: N/A;   Current Outpatient Prescriptions on File Prior to Visit  Medication Sig Dispense Refill  . CALCIUM PO Take 1 tablet by mouth daily.    . Cholecalciferol (VITAMIN D3) 2000 UNITS TABS Take 1 tablet by mouth daily.    . Coenzyme Q10 (COQ10 PO) Take 100 mg by mouth daily.     . fluticasone (FLONASE)  50 MCG/ACT nasal spray Place 1 spray into both nostrils daily as needed for allergies or rhinitis.    . hydroxypropyl methylcellulose / hypromellose (ISOPTO TEARS / GONIOVISC) 2.5 % ophthalmic solution Place 1 drop into both eyes 3 (three) times daily as needed for dry eyes.    Marland Kitchen ibuprofen (ADVIL,MOTRIN) 200 MG tablet Take 200-400 mg by mouth every 6 (six) hours as needed for moderate pain.     Marland Kitchen KRILL OIL PO Take 2 tablets by mouth daily.    . LUTEIN PO Take 25 mg by mouth daily.     . Multiple Vitamins-Minerals (EYE VITAMINS PO) Take 1 tablet by mouth daily.    Marland Kitchen saccharomyces boulardii (FLORASTOR) 250 MG capsule Take 250 mg by mouth daily.      Marland Kitchen amLODipine (NORVASC) 2.5 MG tablet Take 1 tablet (2.5 mg total) by mouth daily. (Patient not taking: Reported on 06/22/2015) 90 tablet 3  . HYDROcodone-acetaminophen (NORCO/VICODIN) 5-325 MG tablet Take 1 tablet by mouth every 6 (six) hours as needed for moderate pain. Reported on 06/22/2015    . losartan (COZAAR) 100 MG tablet Take 1 tablet (100 mg total) by mouth daily. (Patient not taking: Reported on 06/22/2015) 90 tablet 1  . traMADol (ULTRAM) 50 MG tablet Take 50 mg by mouth every 6 (six) hours as needed for moderate pain. Reported on 06/22/2015     No current facility-administered medications on file prior to visit.   No Known Allergies Social History   Social History  . Marital Status: Widowed    Spouse Name: N/A  . Number of Children: 1  . Years of Education: N/A   Occupational History  . Retired     Social History Main Topics  . Smoking status: Never Smoker   . Smokeless tobacco: Never Used  . Alcohol Use: No  . Drug Use: No  . Sexual Activity: Not on file   Other Topics Concern  . Not on file   Social History Narrative      Review of Systems  All other systems reviewed and are negative.      Objective:   Physical Exam  Constitutional: He is oriented to person, place, and time. He appears well-developed and well-nourished. No distress.  Neck: Neck supple. No thyromegaly present.  Cardiovascular: Normal rate, regular rhythm and normal heart sounds.   Pulmonary/Chest: Effort normal and breath sounds normal. No respiratory distress. He has no wheezes. He has no rales.  Abdominal: Soft. Bowel sounds are normal. He exhibits no distension. There is no tenderness. There is no rebound.  Neurological: He is alert and oriented to person, place, and time. He has normal reflexes. He displays tremor. He displays no atrophy. No cranial nerve deficit. He exhibits normal muscle tone. Coordination and gait normal.  Skin: He is not diaphoretic.  Vitals reviewed.          Assessment & Plan:  Palpitations - Plan: CBC with Differential/Platelet, COMPLETE METABOLIC PANEL WITH GFR, TSH, Magnesium  Loss of weight  Essential tremor  I believe the patient's blood pressure has dropped considerably due to the 20 pound weight loss he experienced while hospitalized. At the present time the majority of his blood pressures are so well controlled I do not believe that he will benefit from taking a blood pressure medication. I do believe that with the substantial walk loss of weight, the prednisone, and likely electrolyte abnormalities in his body, PVCs have become more frequent. I will check the patient with a TSH as  well as a CMP and magnesium level to rule out electrolyte or endocrinologic problems that can trigger PVCs. If lab work is normal, we can consider a Holter monitor to verify that these are in fact PVCs. We could treat the patient with a beta blocker which may also help the essential tremor. At the present time the patient is hesitant to want to do this. He is also hesitant to want to wear a Holter monitor. I believe the essential tremor has gotten worse due to the fact the patient has lost some his weight and because of the prednisone. I anticipate that assuming labs are normal, the essential tremor may improve as the patient gains weight we could also consider adding a beta blocker to help manage the essential tremor.

## 2015-06-23 LAB — COMPLETE METABOLIC PANEL WITH GFR
ALT: 23 U/L (ref 9–46)
AST: 16 U/L (ref 10–35)
Albumin: 3.8 g/dL (ref 3.6–5.1)
Alkaline Phosphatase: 56 U/L (ref 40–115)
BILIRUBIN TOTAL: 0.5 mg/dL (ref 0.2–1.2)
BUN: 25 mg/dL (ref 7–25)
CHLORIDE: 98 mmol/L (ref 98–110)
CO2: 23 mmol/L (ref 20–31)
Calcium: 9.1 mg/dL (ref 8.6–10.3)
Creat: 0.92 mg/dL (ref 0.70–1.18)
GFR, EST NON AFRICAN AMERICAN: 79 mL/min (ref >=60)
Glucose, Bld: 150 mg/dL — ABNORMAL HIGH (ref 70–99)
POTASSIUM: 3.4 mmol/L — AB (ref 3.5–5.3)
Sodium: 136 mmol/L (ref 135–146)
Total Protein: 5.9 g/dL — ABNORMAL LOW (ref 6.1–8.1)

## 2015-06-23 LAB — MAGNESIUM: MAGNESIUM: 1.6 mg/dL (ref 1.5–2.5)

## 2015-06-23 LAB — TSH: TSH: 0.79 mIU/L (ref 0.40–4.50)

## 2015-06-24 ENCOUNTER — Inpatient Hospital Stay: Payer: Medicare Other | Admitting: Family Medicine

## 2015-06-24 NOTE — Progress Notes (Signed)
This encounter was created in error - please disregard.

## 2015-06-25 ENCOUNTER — Encounter: Payer: Self-pay | Admitting: Family Medicine

## 2015-06-25 ENCOUNTER — Other Ambulatory Visit: Payer: Self-pay | Admitting: Family Medicine

## 2015-06-25 MED ORDER — POTASSIUM CHLORIDE ER 10 MEQ PO TBCR: 10.0000 meq | EXTENDED_RELEASE_TABLET | Freq: Every day | ORAL | Status: DC

## 2015-06-28 ENCOUNTER — Other Ambulatory Visit: Payer: Self-pay | Admitting: Family Medicine

## 2015-06-28 DIAGNOSIS — E876 Hypokalemia: Secondary | ICD-10-CM

## 2015-06-28 DIAGNOSIS — R790 Abnormal level of blood mineral: Secondary | ICD-10-CM

## 2015-06-30 ENCOUNTER — Encounter: Payer: Self-pay | Admitting: Family Medicine

## 2015-07-01 ENCOUNTER — Telehealth: Payer: Self-pay | Admitting: *Deleted

## 2015-07-01 NOTE — Telephone Encounter (Signed)
Received fax from insurance regarding the prescription for triamterene/ HCTZ. Reports interaction with potassium. Interaction may cause decreased renal excretion of potassium.   MD made aware and recommendations are as follows:  D/C triamterene/ HCTZ.  D/C potassium.   Schedule OV in (1) week to recheck potassium levels.   Call placed to patient. [place].

## 2015-07-02 NOTE — Telephone Encounter (Signed)
Call placed to patient. LMTRC.  

## 2015-07-05 NOTE — Telephone Encounter (Signed)
Call placed to patient. LMTRC.  

## 2015-07-06 ENCOUNTER — Other Ambulatory Visit: Payer: Self-pay | Admitting: *Deleted

## 2015-07-06 DIAGNOSIS — E876 Hypokalemia: Secondary | ICD-10-CM

## 2015-07-06 NOTE — Telephone Encounter (Signed)
Too many cooks in the kitchen.  No changes until I get lab work back.  I was trying to correct his low potassium that was probably due to triamterene/HCTZ.

## 2015-07-06 NOTE — Telephone Encounter (Signed)
Call placed to patient.   States that ENT advised to stop taking potassium, but to continue triamterene/ HCTZ.   Advised that MD requested patient to re-check labs. Patient will come in of Friday to have labs repeated.

## 2015-07-09 ENCOUNTER — Other Ambulatory Visit: Payer: Medicare Other

## 2015-07-09 DIAGNOSIS — R790 Abnormal level of blood mineral: Secondary | ICD-10-CM

## 2015-07-09 DIAGNOSIS — E876 Hypokalemia: Secondary | ICD-10-CM

## 2015-07-10 LAB — BASIC METABOLIC PANEL
BUN: 17 mg/dL (ref 7–25)
CHLORIDE: 103 mmol/L (ref 98–110)
CO2: 27 mmol/L (ref 20–31)
Calcium: 9 mg/dL (ref 8.6–10.3)
Creat: 1.01 mg/dL (ref 0.70–1.18)
GLUCOSE: 118 mg/dL — AB (ref 70–99)
POTASSIUM: 3.8 mmol/L (ref 3.5–5.3)
Sodium: 142 mmol/L (ref 135–146)

## 2015-07-10 LAB — MAGNESIUM: Magnesium: 2 mg/dL (ref 1.5–2.5)

## 2015-07-12 ENCOUNTER — Encounter: Payer: Self-pay | Admitting: Family Medicine

## 2015-09-03 ENCOUNTER — Other Ambulatory Visit: Payer: Self-pay | Admitting: Family Medicine

## 2015-10-01 ENCOUNTER — Ambulatory Visit: Payer: Medicare Other | Admitting: Family Medicine

## 2015-10-04 ENCOUNTER — Ambulatory Visit (INDEPENDENT_AMBULATORY_CARE_PROVIDER_SITE_OTHER): Payer: Medicare Other | Admitting: Family Medicine

## 2015-10-04 ENCOUNTER — Encounter: Payer: Self-pay | Admitting: Family Medicine

## 2015-10-04 VITALS — BP 152/90 | HR 78 | Temp 98.5°F | Resp 14 | Ht 71.0 in | Wt 171.0 lb

## 2015-10-04 DIAGNOSIS — G250 Essential tremor: Secondary | ICD-10-CM | POA: Diagnosis not present

## 2015-10-04 DIAGNOSIS — R002 Palpitations: Secondary | ICD-10-CM | POA: Diagnosis not present

## 2015-10-04 MED ORDER — METOPROLOL SUCCINATE ER 25 MG PO TB24: 25.0000 mg | ORAL_TABLET | Freq: Every day | ORAL | Status: DC

## 2015-10-04 NOTE — Progress Notes (Signed)
Subjective:    Patient ID: Joel Bauer, male    DOB: 1934-02-14, 82 y.o.   MRN: [id number]  HPI 06/2015 Patient is a very pleasant 82 year old white male who was recently admitted to the hospital in February for recurrent bouts of diverticulitis. He underwent a laparoscopic partial colectomy due to recurrent diverticulitis. The patient lost approximately 20 pounds of weight during that time. Since discharge from the hospital, his blood pressure at home has been averaging between 123456 systolic over 70 diastolic.  He also reports frequent palpitations. He will have a normal sinus rhythm and then a pronounced abnormal beat followed by a brief Pauls. This sounds like a PVC. On examination today he is in normal sinus rhythm with no auscultated PVCs or PACs. These palpitations have been worse since the weight loss. It also seems to been compounded by the fact the patient has been on prednisone for last 12 days due to Mnire's disease. He also reports worsening of an essential tremor. He has a very fine repetitive tremor in both arms right greater than left. This worsens with focus and attention and activity. He has a normal gait. He does not have a shuffling gait. He is not falling. He denies any memory loss. There is no evidence of bradykinesia. He does not have a mask like facies. He demonstrates no other signs or symptoms of Parkinson's disease.  At that time, my plan was: I believe the patient's blood pressure has dropped considerably due to the 20 pound weight loss he experienced while hospitalized. At the present time the majority of his blood pressures are so well controlled I do not believe that he will benefit from taking a blood pressure medication. I do believe that with the substantial walk loss of weight, the prednisone, and likely electrolyte abnormalities in his body, PVCs have become more frequent. I will check the patient with a TSH as well as a CMP and magnesium level to rule out  electrolyte or endocrinologic problems that can trigger PVCs. If lab work is normal, we can consider a Holter monitor to verify that these are in fact PVCs. We could treat the patient with a beta blocker which may also help the essential tremor. At the present time the patient is hesitant to want to do this. He is also hesitant to want to wear a Holter monitor. I believe the essential tremor has gotten worse due to the fact the patient has lost some his weight and because of the prednisone. I anticipate that assuming labs are normal, the essential tremor may improve as the patient gains weight we could also consider adding a beta blocker to help manage the essential tremor.  10/04/15 Patient continues to have palpitations. I auscultated the patient for over 2 minutes and was unable to appreciate any abnormal heartbeats.  He denies any syncope or presyncope or chest pain or shortness of breath. His blood pressure however is also been higher at home between [id number] and [id number] systolic. He continues to have an essential tremor that he has had his entire life. Past Medical History  Diagnosis Date  . Adenomatous colon polyp 2001  . Diverticulosis of colon (without mention of hemorrhage) 2005  . Hypertension   . Hyperlipidemia   . Arthritis   . Shingles   . Pleurisy   . Prostatitis   . Retinal hemorrhage   . Heart murmur   . History of skin cancer   . GERD (gastroesophageal reflux disease)   . Hx of  Clostridium difficile infection     3 MONTHS AGO  . History of kidney stones   . 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 Diverticulitis of colon - recurrent/persistent 12/29/2014  . Anemia     as a child   . Cancer (HCC)     hx of skin cancer   . Macular degeneration     left eye   Past Surgical History  Procedure Laterality Date  . Lithotripsy    . Tonsillectomy    . Cataracts removed    . Cystoscopy with retrograde pyelogram, ureteroscopy and stent placement Right 10/18/2013    Procedure:  CYSTOSCOPY WITH RIGHT RETROGRADE/RIGHT URETEROSCOPY, STONE BASKETRY,  AND STENT PLACEMENT RIGHT;  Surgeon: Alexis Frock, MD;  Location: [facility];  Service: Urology;  Laterality: Right;  . Colonoscopy    . Colonoscopy N/A 06/15/2014    Procedure: COLONOSCOPY;  Surgeon: Gatha Mayer, MD;  Location: [facility];  Service: Endoscopy;  Laterality: N/A;  . Fmt  06/2014  . Eye surgery      3 injections to left eye for macular degeneration   . Laparoscopic low anterior resection N/A 04/19/2015    Procedure: LAPAROSCOPIC ASSISTED  LOW ANTERIOR RESECTION, splenic flexure mobilization;  Surgeon: Fanny Skates, MD;  Location: [facility];  Service: General;  Laterality: N/A;   Current Outpatient Prescriptions on File Prior to Visit  Medication Sig Dispense Refill  . CALCIUM PO Take 1 tablet by mouth daily.    . Cholecalciferol (VITAMIN D3) 2000 UNITS TABS Take 1 tablet by mouth daily.    . Coenzyme Q10 (COQ10 PO) Take 100 mg by mouth daily.     . fluticasone (FLONASE) 50 MCG/ACT nasal spray Place 1 spray into both nostrils daily as needed for allergies or rhinitis.    Marland Kitchen HYDROcodone-acetaminophen (NORCO/VICODIN) 5-325 MG tablet Take 1 tablet by mouth every 6 (six) hours as needed for moderate pain. Reported on 06/22/2015    . hydroxypropyl methylcellulose / hypromellose (ISOPTO TEARS / GONIOVISC) 2.5 % ophthalmic solution Place 1 drop into both eyes 3 (three) times daily as needed for dry eyes.    Marland Kitchen ibuprofen (ADVIL,MOTRIN) 200 MG tablet Take 200-400 mg by mouth every 6 (six) hours as needed for moderate pain.     Marland Kitchen KRILL OIL PO Take 2 tablets by mouth daily.    Marland Kitchen losartan (COZAAR) 100 MG tablet TAKE 1 TABLET (100 MG TOTAL) BY MOUTH DAILY. 90 tablet 3  . LUTEIN PO Take 25 mg by mouth daily.     . Multiple Vitamins-Minerals (EYE VITAMINS PO) Take 1 tablet by mouth daily.    . potassium chloride (K-DUR) 10 MEQ tablet Take 1 tablet (10 mEq total) by mouth daily. 90 tablet 1  . saccharomyces boulardii (FLORASTOR)  250 MG capsule Take 250 mg by mouth daily.    . traMADol (ULTRAM) 50 MG tablet Take 50 mg by mouth every 6 (six) hours as needed for moderate pain. Reported on 06/22/2015    . triamterene-hydrochlorothiazide (MAXZIDE-25) 37.5-25 MG tablet Take 1 tablet by mouth daily.     No current facility-administered medications on file prior to visit.   No Known Allergies Social History   Social History  . Marital Status: Widowed    Spouse Name: N/A  . Number of Children: 1  . Years of Education: N/A   Occupational History  . Retired     Social History Main Topics  . Smoking status: Never Smoker   . Smokeless tobacco: Never Used  .  Alcohol Use: No  . Drug Use: No  . Sexual Activity: Not on file   Other Topics Concern  . Not on file   Social History Narrative      Review of Systems  All other systems reviewed and are negative.      Objective:   Physical Exam  Constitutional: He is oriented to person, place, and time. He appears well-developed and well-nourished. No distress.  Neck: Neck supple. No thyromegaly present.  Cardiovascular: Normal rate, regular rhythm and normal heart sounds.   Pulmonary/Chest: Effort normal and breath sounds normal. No respiratory distress. He has no wheezes. He has no rales.  Abdominal: Soft. Bowel sounds are normal. He exhibits no distension. There is no tenderness. There is no rebound.  Neurological: He is alert and oriented to person, place, and time. He has normal reflexes. He displays tremor. He displays no atrophy. No cranial nerve deficit. He exhibits normal muscle tone. Coordination and gait normal.  Skin: He is not diaphoretic.  Vitals reviewed.         Assessment & Plan:  Palpitations - Plan: metoprolol succinate (TOPROL-XL) 25 MG 24 hr tablet  Essential tremor  He declines a Holter monitor. He consents to Toprol-XL 25 mg by mouth daily to help reduce the frequency of the PVCs and PACs which I believe he is dealing with. Hopefully  this will also help lower his blood pressure. Reassess in 2 weeks

## 2015-11-19 ENCOUNTER — Ambulatory Visit (INDEPENDENT_AMBULATORY_CARE_PROVIDER_SITE_OTHER): Payer: Medicare Other | Admitting: Family Medicine

## 2015-11-19 VITALS — BP 134/62 | HR 76 | Temp 98.2°F | Resp 14 | Ht 71.0 in | Wt 171.0 lb

## 2015-11-19 DIAGNOSIS — R002 Palpitations: Secondary | ICD-10-CM | POA: Diagnosis not present

## 2015-11-19 DIAGNOSIS — K219 Gastro-esophageal reflux disease without esophagitis: Secondary | ICD-10-CM

## 2015-11-19 MED ORDER — METOPROLOL SUCCINATE ER 25 MG PO TB24: 25.0000 mg | ORAL_TABLET | Freq: Every day | ORAL | 3 refills | Status: DC

## 2015-11-19 MED ORDER — OMEPRAZOLE 40 MG PO CPDR: 40.0000 mg | DELAYED_RELEASE_CAPSULE | Freq: Every day | ORAL | 3 refills | Status: DC

## 2015-11-19 NOTE — Progress Notes (Signed)
Subjective:    Patient ID: Joel Bauer, male    DOB: 1934/01/22, 82 y.o.   MRN: [id number]  HPI  06/2015 Patient is a very pleasant 82 year old white male who was recently admitted to the hospital in February for recurrent bouts of diverticulitis. He underwent a laparoscopic partial colectomy due to recurrent diverticulitis. The patient lost approximately 20 pounds of weight during that time. Since discharge from the hospital, his blood pressure at home has been averaging between 123456 systolic over 70 diastolic.  He also reports frequent palpitations. He will have a normal sinus rhythm and then a pronounced abnormal beat followed by a brief Pauls. This sounds like a PVC. On examination today he is in normal sinus rhythm with no auscultated PVCs or PACs. These palpitations have been worse since the weight loss. It also seems to been compounded by the fact the patient has been on prednisone for last 12 days due to Mnire's disease. He also reports worsening of an essential tremor. He has a very fine repetitive tremor in both arms right greater than left. This worsens with focus and attention and activity. He has a normal gait. He does not have a shuffling gait. He is not falling. He denies any memory loss. There is no evidence of bradykinesia. He does not have a mask like facies. He demonstrates no other signs or symptoms of Parkinson's disease.  At that time, my plan was: I believe the patient's blood pressure has dropped considerably due to the 20 pound weight loss he experienced while hospitalized. At the present time the majority of his blood pressures are so well controlled I do not believe that he will benefit from taking a blood pressure medication. I do believe that with the substantial walk loss of weight, the prednisone, and likely electrolyte abnormalities in his body, PVCs have become more frequent. I will check the patient with a TSH as well as a CMP and magnesium level to rule out  electrolyte or endocrinologic problems that can trigger PVCs. If lab work is normal, we can consider a Holter monitor to verify that these are in fact PVCs. We could treat the patient with a beta blocker which may also help the essential tremor. At the present time the patient is hesitant to want to do this. He is also hesitant to want to wear a Holter monitor. I believe the essential tremor has gotten worse due to the fact the patient has lost some his weight and because of the prednisone. I anticipate that assuming labs are normal, the essential tremor may improve as the patient gains weight we could also consider adding a beta blocker to help manage the essential tremor.  10/04/15 Patient continues to have palpitations. I auscultated the patient for over 2 minutes and was unable to appreciate any abnormal heartbeats.  He denies any syncope or presyncope or chest pain or shortness of breath. His blood pressure however is also been higher at home between [id number] and [id number] systolic. He continues to have an essential tremor that he has had his entire life.  At that time, my plan was: He declines a Holter monitor. He consents to Toprol-XL 25 mg by mouth daily to help reduce the frequency of the PVCs and PACs which I believe he is dealing with. Hopefully this will also help lower his blood pressure. Reassess in 2 weeks  11/19/15  Since states the PVCs have improved dramatically since he started Toprol. He also states that his tremor has improved  dramatically since he started the Toprol. He denies any dizziness or fatigue on the medication. He would like to continue his present dose. His blood pressures good today. However he does report indigestion for 3 weeks. He is tried Zantac and Tums and Rolaids with very minimal relief. He denies any melena, nausea, or vomiting. He denies abdominal pain Past Medical History:  Diagnosis Date  . Adenomatous colon polyp 2001  . Anemia    as a child   . Arthritis   . Cancer  (HCC)    hx of skin cancer   . Diverticulitis of colon - recurrent/persistent 12/29/2014  . Diverticulosis of colon (without mention of hemorrhage) 2005  . GERD (gastroesophageal reflux disease)   . Heart murmur   . History of kidney stones   . History of skin cancer   . Hx of Clostridium difficile infection    3 MONTHS AGO  . Hyperlipidemia   . Hypertension   . Macular degeneration    left eye  . Pleurisy   . Prostatitis   . 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 Retinal hemorrhage   . Shingles    Past Surgical History:  Procedure Laterality Date  . CATARACTS REMOVED    . COLONOSCOPY    . COLONOSCOPY N/A 06/15/2014   Procedure: COLONOSCOPY;  Surgeon: Gatha Mayer, MD;  Location: [facility];  Service: Endoscopy;  Laterality: N/A;  . CYSTOSCOPY WITH RETROGRADE PYELOGRAM, URETEROSCOPY AND STENT PLACEMENT Right 10/18/2013   Procedure: CYSTOSCOPY WITH RIGHT RETROGRADE/RIGHT URETEROSCOPY, STONE BASKETRY,  AND STENT PLACEMENT RIGHT;  Surgeon: Alexis Frock, MD;  Location: [facility];  Service: Urology;  Laterality: Right;  . EYE SURGERY     3 injections to left eye for macular degeneration   . FMT  06/2014  . LAPAROSCOPIC LOW ANTERIOR RESECTION N/A 04/19/2015   Procedure: LAPAROSCOPIC ASSISTED  LOW ANTERIOR RESECTION, splenic flexure mobilization;  Surgeon: Fanny Skates, MD;  Location: [facility];  Service: General;  Laterality: N/A;  . LITHOTRIPSY    . TONSILLECTOMY     Current Outpatient Prescriptions on File Prior to Visit  Medication Sig Dispense Refill  . CALCIUM PO Take 1 tablet by mouth daily.    . Cholecalciferol (VITAMIN D3) 2000 UNITS TABS Take 1 tablet by mouth daily.    . Coenzyme Q10 (COQ10 PO) Take 100 mg by mouth daily.     . fluticasone (FLONASE) 50 MCG/ACT nasal spray Place 1 spray into both nostrils daily as needed for allergies or rhinitis.    Marland Kitchen HYDROcodone-acetaminophen (NORCO/VICODIN) 5-325 MG tablet Take 1 tablet by mouth every 6 (six) hours as needed  for moderate pain. Reported on 06/22/2015    . hydroxypropyl methylcellulose / hypromellose (ISOPTO TEARS / GONIOVISC) 2.5 % ophthalmic solution Place 1 drop into both eyes 3 (three) times daily as needed for dry eyes.    Marland Kitchen ibuprofen (ADVIL,MOTRIN) 200 MG tablet Take 200-400 mg by mouth every 6 (six) hours as needed for moderate pain.     Marland Kitchen KRILL OIL PO Take 2 tablets by mouth daily.    Marland Kitchen losartan (COZAAR) 100 MG tablet TAKE 1 TABLET (100 MG TOTAL) BY MOUTH DAILY. 90 tablet 3  . LUTEIN PO Take 25 mg by mouth daily.     . metoprolol succinate (TOPROL-XL) 25 MG 24 hr tablet Take 1 tablet (25 mg total) by mouth daily. 90 tablet 3  . Multiple Vitamins-Minerals (EYE VITAMINS PO) Take 1 tablet by mouth daily.    . potassium  chloride (K-DUR) 10 MEQ tablet Take 1 tablet (10 mEq total) by mouth daily. 90 tablet 1  . saccharomyces boulardii (FLORASTOR) 250 MG capsule Take 250 mg by mouth daily.    . traMADol (ULTRAM) 50 MG tablet Take 50 mg by mouth every 6 (six) hours as needed for moderate pain. Reported on 06/22/2015    . triamterene-hydrochlorothiazide (MAXZIDE-25) 37.5-25 MG tablet Take 1 tablet by mouth daily.     No current facility-administered medications on file prior to visit.    No Known Allergies Social History   Social History  . Marital status: Widowed    Spouse name: N/A  . Number of children: 1  . Years of education: N/A   Occupational History  . Retired     Social History Main Topics  . Smoking status: Never Smoker  . Smokeless tobacco: Never Used  . Alcohol use No  . Drug use: No  . Sexual activity: Not on file   Other Topics Concern  . Not on file   Social History Narrative  . No narrative on file      Review of Systems  All other systems reviewed and are negative.      Objective:   Physical Exam  Constitutional: He is oriented to person, place, and time. He appears well-developed and well-nourished. No distress.  Neck: Neck supple. No thyromegaly present.    Cardiovascular: Normal rate, regular rhythm and normal heart sounds.   Pulmonary/Chest: Effort normal and breath sounds normal. No respiratory distress. He has no wheezes. He has no rales.  Abdominal: Soft. Bowel sounds are normal. He exhibits no distension. There is no tenderness. There is no rebound.  Neurological: He is alert and oriented to person, place, and time. He has normal reflexes. He displays tremor. He displays no atrophy. No cranial nerve deficit. He exhibits normal muscle tone. Coordination and gait normal.  Skin: He is not diaphoretic.  Vitals reviewed.         Assessment & Plan:   Gastroesophageal reflux disease without esophagitis - Plan: omeprazole (PRILOSEC) 40 MG capsule  Palpitations - Plan: metoprolol succinate (TOPROL-XL) 25 MG 24 hr tablet  Patient can continue the Toprol-XL 25 mg a day for tremor as well as PVCs. Add omeprazole 40 mg a day. If symptoms are not improving after 1 week, return for a H pylori breath test. If symptoms do not improve I would recommend trying a medication for 3 months and then discontinuing the medication to see if symptoms persist

## 2015-12-07 ENCOUNTER — Encounter: Payer: Self-pay | Admitting: Physician Assistant

## 2015-12-07 ENCOUNTER — Ambulatory Visit (INDEPENDENT_AMBULATORY_CARE_PROVIDER_SITE_OTHER): Payer: Medicare Other | Admitting: Physician Assistant

## 2015-12-07 VITALS — BP 150/70 | HR 68 | Ht 71.0 in | Wt 171.8 lb

## 2015-12-07 DIAGNOSIS — R002 Palpitations: Secondary | ICD-10-CM | POA: Diagnosis not present

## 2015-12-07 DIAGNOSIS — I951 Orthostatic hypotension: Secondary | ICD-10-CM | POA: Diagnosis not present

## 2015-12-07 DIAGNOSIS — I351 Nonrheumatic aortic (valve) insufficiency: Secondary | ICD-10-CM | POA: Diagnosis not present

## 2015-12-07 DIAGNOSIS — I119 Hypertensive heart disease without heart failure: Secondary | ICD-10-CM

## 2015-12-07 DIAGNOSIS — I10 Essential (primary) hypertension: Secondary | ICD-10-CM

## 2015-12-07 NOTE — Patient Instructions (Signed)
Medication Instructions:  Your physician recommends that you continue on your current medications as directed. Please refer to the Current Medication list given to you today.   Labwork: Your physician recommends that you return for lab work in: TODAY (bmet, tsh, mg)   Testing/Procedures: none  Follow-Up: Your physician wants you to follow-up in: 6-8 months with Dr. Meda Coffee. You will receive a reminder letter in the mail two months in advance. If you don't receive a letter, please call our office to schedule the follow-up appointment.   Any Other Special Instructions Will Be Listed Below (If Applicable).     If you need a refill on your cardiac medications before your next appointment, please call your pharmacy.

## 2015-12-07 NOTE — Progress Notes (Signed)
Cardiology Office Note    Date:  12/07/2015  ID:  Joel Bauer, DOB June 30, 1933, MRN [id number] PCP:  Odette Fraction, MD  Cardiologist:  Meda Coffee   Chief Complaint: routine f/u hypertensive heart disease  History of Present Illness:  Joel Bauer is a 82 y.o. male with history of HTN, hypertensive heart disease, orthostasis following weight loss after laparoscopic low anterior resection for chronic diverticulitis, hyperlipidemia,arthritis, retinal hemorrhage, macular degeneration who presents for f/u. Saw Dr. Meda Coffee in the past for atypical chest pain with nuc 03/2015 that was normal except for hypertensive response to exercise. 2D Echo 03/2015 showed EF 60-65%, mild LVH, grade 1 DD with normal filling pressure, aortic sclerosis without steonsis, mild AI. At last OV 05/2015 he was exhibiting signs of orthostasis so amlodipine was held. He saw his PCP 09/2015 complaining of palpitations - per PCP note " I auscultated the patient for over 2 minutes and was unable to appreciate any abnormal heartbeats." The patient declined a Holter. Toprol 25mg  daily was added. Labs 06/2015 showed Mg 2, K 3.8, Cr 1.01, glucose 118 (A1C 5.9 in 03/2015).  He presents back to clinic feeling well. He states losartan was held about 6 weeks ago in lieu of starting Toprol. He reports he had some issues with his BP dropping, prompting the losartan to be held. I do not see this in primary care notes. He also states his potassium was stopped after he was told his f/u potassium was normal earlier this year. It does not look like he was supposed to stop it at that time.  Nevertheless, he has been following his BP at home and has mainly been seeing readings of 120s-low 130s/70s. He has not had any further issues with BP dropping on the regimen he is on now. He does report a general sense of fatigue ever since his colon surgery but states it's gotten better over the last few weeks. No chest pain, SOB, syncope. His palpitations are  quiescent on metoprolol. They were described as an occasional skipped heartbeat. No sustained tachypalps.   Past Medical History:  Diagnosis Date  . Adenomatous colon polyp 2001  . Anemia    as a child   . Aortic regurgitation    a. mild by echo 03/2015  . Arthritis   . Cancer (HCC)    hx of skin cancer   . Diverticulitis of colon - recurrent/persistent 12/29/2014  . Diverticulosis of colon (without mention of hemorrhage) 2005  . GERD (gastroesophageal reflux disease)   . Heart murmur   . History of kidney stones   . History of skin cancer   . Hx of Clostridium difficile infection   . Hyperglycemia   . Hyperlipidemia   . Hypertension   . Hypertensive heart disease   . Macular degeneration    left eye  . Orthostasis   . Pleurisy   . Prostatitis   . 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 Retinal hemorrhage   . Shingles     Past Surgical History:  Procedure Laterality Date  . CATARACTS REMOVED    . COLONOSCOPY    . COLONOSCOPY N/A 06/15/2014   Procedure: COLONOSCOPY;  Surgeon: Gatha Mayer, MD;  Location: [facility];  Service: Endoscopy;  Laterality: N/A;  . CYSTOSCOPY WITH RETROGRADE PYELOGRAM, URETEROSCOPY AND STENT PLACEMENT Right 10/18/2013   Procedure: CYSTOSCOPY WITH RIGHT RETROGRADE/RIGHT URETEROSCOPY, STONE BASKETRY,  AND STENT PLACEMENT RIGHT;  Surgeon: Alexis Frock, MD;  Location: [facility];  Service: Urology;  Laterality: Right;  . EYE SURGERY     3 injections to left eye for macular degeneration   . FMT  06/2014  . LAPAROSCOPIC LOW ANTERIOR RESECTION N/A 04/19/2015   Procedure: LAPAROSCOPIC ASSISTED  LOW ANTERIOR RESECTION, splenic flexure mobilization;  Surgeon: Fanny Skates, MD;  Location: [facility];  Service: General;  Laterality: N/A;  . LITHOTRIPSY    . TONSILLECTOMY      Current Medications: Current Outpatient Prescriptions  Medication Sig Dispense Refill  . CALCIUM PO Take 1 tablet by mouth daily.    . Cholecalciferol (VITAMIN D3) 2000  UNITS TABS Take 1 tablet by mouth daily.    . Coenzyme Q10 (COQ10 PO) Take 100 mg by mouth daily.     . fluticasone (FLONASE) 50 MCG/ACT nasal spray Place 1 spray into both nostrils daily as needed for allergies or rhinitis.    . hydroxypropyl methylcellulose / hypromellose (ISOPTO TEARS / GONIOVISC) 2.5 % ophthalmic solution Place 1 drop into both eyes 3 (three) times daily as needed for dry eyes.    Marland Kitchen ibuprofen (ADVIL,MOTRIN) 200 MG tablet Take 200-400 mg by mouth every 6 (six) hours as needed for moderate pain.     Marland Kitchen KRILL OIL PO Take 2 tablets by mouth daily.    . LUTEIN PO Take 25 mg by mouth daily.     . metoprolol succinate (TOPROL-XL) 25 MG 24 hr tablet Take 1 tablet (25 mg total) by mouth daily. 90 tablet 3  . omeprazole (PRILOSEC) 40 MG capsule Take 1 capsule (40 mg total) by mouth daily. 30 capsule 3  . saccharomyces boulardii (FLORASTOR) 250 MG capsule Take 250 mg by mouth daily.    Marland Kitchen triamterene-hydrochlorothiazide (MAXZIDE-25) 37.5-25 MG tablet Take 1 tablet by mouth daily.     No current facility-administered medications for this visit.      Allergies:   Review of patient's allergies indicates no known allergies.   Social History   Social History  . Marital status: Widowed    Spouse name: N/A  . Number of children: 1  . Years of education: N/A   Occupational History  . Retired     Social History Main Topics  . Smoking status: Never Smoker  . Smokeless tobacco: Never Used  . Alcohol use No  . Drug use: No  . Sexual activity: Not Asked   Other Topics Concern  . None   Social History Narrative  . None     Family History:  The patient's family history includes Breast cancer in his mother; Colon cancer in his father; Colon polyps in his father; Heart disease in his mother; Kidney disease in his sister.  ROS:   Please see the history of present illness. No fever. All other systems are reviewed and otherwise negative.    PHYSICAL EXAM:   VS:  BP (!) 150/70    Pulse 68   Ht 5\' 11"  (1.803 m)   Wt 171 lb 12.8 oz (77.9 kg)   BMI 23.96 kg/m   BMI: Body mass index is 23.96 kg/m. GEN: Well nourished, well developed WM, in no acute distress  HEENT: normocephalic, atraumatic Neck: no JVD, carotid bruits, or masses Cardiac: RRR; no murmurs, rubs, or gallops, no edema  - no ectopy Respiratory:  clear to auscultation bilaterally, normal work of breathing GI: soft, nontender, nondistended, + BS MS: no deformity or atrophy  Skin: warm and dry, no rash Neuro:  Alert and Oriented x 3, Strength and sensation are intact, follows commands Psych: euthymic  mood, full affect  Wt Readings from Last 3 Encounters:  12/07/15 171 lb 12.8 oz (77.9 kg)  11/19/15 171 lb (77.6 kg)  10/04/15 171 lb (77.6 kg)      Studies/Labs Reviewed:   EKG:  EKG was ordered today and personally reviewed by me and demonstrates NSR 68bpm, no acute changes  Recent Labs: 06/22/2015: ALT 23; Hemoglobin 13.7; Platelets 133; TSH 0.79 07/09/2015: BUN 17; Creat 1.01; Magnesium 2.0; Potassium 3.8; Sodium 142    Additional studies/ records that were reviewed today include: Summarized above.    ASSESSMENT & PLAN:   1. Hypertensive heart disease without heart failure - no evidence of CHF on exam. Advised to continue to follow BP closely and limit excessive sodium intake. 2. Essential HTN with h/o orthostasis - BP mildly elevated in clinic but he follows it closely at home and reports it running in the 120-130/70 range recently. Most recent BP at PCP on 11/19/15 was 134/62. Given his history of orthostasis, will continue to follow on present regimen. I have asked him to call if he starts to see readings closer to 140/90 or higher. If his BP starts to creep back up I would recommend adding losartan back at 25mg  daily to start. 3. Aortic regurgitation - will follow clinically for now. Timing of repeat echocardiogram per primary cardiologist. 4. Palpitations - quiescent on metoprolol. I think he  may have misunderstood the results of his 06/2015 labs - his potassium had improved (3.8) because of the repletion at that time. He was supposed to continue at that time. Will recheck BMET and TSH today.   Disposition: F/u with Dr. Meda Coffee in 6-8 months.   Medication Adjustments/Labs and Tests Ordered: Current medicines are reviewed at length with the patient today.  Concerns regarding medicines are outlined above. Medication changes, Labs and Tests ordered today are summarized above and listed in the Patient Instructions accessible in Encounters.   Raechel Ache PA-C  12/07/2015 2:52 PM    [facility] [address], [place], [place]  [address] Phone: [phone]; Fax: [phone]

## 2015-12-08 LAB — BASIC METABOLIC PANEL
BUN: 18 mg/dL (ref 7–25)
CHLORIDE: 102 mmol/L (ref 98–110)
CO2: 30 mmol/L (ref 20–31)
Calcium: 8.8 mg/dL (ref 8.6–10.3)
Creat: 1.1 mg/dL (ref 0.70–1.18)
Glucose, Bld: 76 mg/dL (ref 65–99)
POTASSIUM: 3.7 mmol/L (ref 3.5–5.3)
SODIUM: 140 mmol/L (ref 135–146)

## 2015-12-08 LAB — TSH: TSH: 0.97 mIU/L (ref 0.40–4.50)

## 2015-12-08 LAB — MAGNESIUM: Magnesium: 2.1 mg/dL (ref 1.5–2.5)

## 2015-12-24 ENCOUNTER — Other Ambulatory Visit: Payer: Self-pay | Admitting: Family Medicine

## 2015-12-24 DIAGNOSIS — K219 Gastro-esophageal reflux disease without esophagitis: Secondary | ICD-10-CM

## 2015-12-24 MED ORDER — OMEPRAZOLE 40 MG PO CPDR: 40.0000 mg | DELAYED_RELEASE_CAPSULE | Freq: Every day | ORAL | 3 refills | Status: DC

## 2016-04-10 ENCOUNTER — Ambulatory Visit (INDEPENDENT_AMBULATORY_CARE_PROVIDER_SITE_OTHER): Payer: Medicare Other | Admitting: Family Medicine

## 2016-04-10 ENCOUNTER — Encounter: Payer: Self-pay | Admitting: Family Medicine

## 2016-04-10 VITALS — BP 142/70 | HR 78 | Temp 98.7°F | Resp 16 | Ht 71.0 in | Wt 178.0 lb

## 2016-04-10 DIAGNOSIS — S0011XA Contusion of right eyelid and periocular area, initial encounter: Secondary | ICD-10-CM

## 2016-04-10 NOTE — Progress Notes (Signed)
Subjective:    Patient ID: Joel Bauer, male    DOB: 1933/08/23, 83 y.o.   MRN: [id number]  HPI Patient was playing tennis Friday when he took an awkward swimming with his tennis racket striking himself above his right eye on the orbital bone underneath his eyebrow. Aside from pain there was no initial problem. He even continued to play tennis for several sets. He denies any loss of consciousness, vision changes, headache, neurologic deficits. He awoke Saturday morning with a significant right sided hematoma above the right eye and bruising spreading around his right eye across his nasal bridge and down onto his left cheek. Aside from the appearance, he has no other symptoms. He denies any headache, dizziness, visual deficit, pain with extraocular movement, nausea vomiting or loss of consciousness. Overall he is doing well. He does have a significant hematoma the size of a golf ball above his right eye and bruising on the right side of his face down to his jaw and over the nasal bridge onto his left cheek.  However there is no active sign of bleeding Past Medical History:  Diagnosis Date  . Adenomatous colon polyp 2001  . Anemia    as a child   . Aortic regurgitation    a. mild by echo 03/2015  . Arthritis   . Cancer (HCC)    hx of skin cancer   . Diverticulitis of colon - recurrent/persistent 12/29/2014  . Diverticulosis of colon (without mention of hemorrhage) 2005  . GERD (gastroesophageal reflux disease)   . Heart murmur   . History of kidney stones   . History of skin cancer   . Hx of Clostridium difficile infection   . Hyperglycemia   . Hyperlipidemia   . Hypertension   . Hypertensive heart disease   . Macular degeneration    left eye  . Orthostasis   . Pleurisy   . Prostatitis   . 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 Retinal hemorrhage   . Shingles    Past Surgical History:  Procedure Laterality Date  . CATARACTS REMOVED    . COLONOSCOPY    .  COLONOSCOPY N/A 06/15/2014   Procedure: COLONOSCOPY;  Surgeon: Gatha Mayer, MD;  Location: [facility];  Service: Endoscopy;  Laterality: N/A;  . CYSTOSCOPY WITH RETROGRADE PYELOGRAM, URETEROSCOPY AND STENT PLACEMENT Right 10/18/2013   Procedure: CYSTOSCOPY WITH RIGHT RETROGRADE/RIGHT URETEROSCOPY, STONE BASKETRY,  AND STENT PLACEMENT RIGHT;  Surgeon: Alexis Frock, MD;  Location: [facility];  Service: Urology;  Laterality: Right;  . EYE SURGERY     3 injections to left eye for macular degeneration   . FMT  06/2014  . LAPAROSCOPIC LOW ANTERIOR RESECTION N/A 04/19/2015   Procedure: LAPAROSCOPIC ASSISTED  LOW ANTERIOR RESECTION, splenic flexure mobilization;  Surgeon: Fanny Skates, MD;  Location: [facility];  Service: General;  Laterality: N/A;  . LITHOTRIPSY    . TONSILLECTOMY     Current Outpatient Prescriptions on File Prior to Visit  Medication Sig Dispense Refill  . CALCIUM PO Take 1 tablet by mouth daily.    . Cholecalciferol (VITAMIN D3) 2000 UNITS TABS Take 1 tablet by mouth daily.    . Coenzyme Q10 (COQ10 PO) Take 100 mg by mouth daily.     . fluticasone (FLONASE) 50 MCG/ACT nasal spray Place 1 spray into both nostrils daily as needed for allergies or rhinitis.    . hydroxypropyl methylcellulose / hypromellose (ISOPTO TEARS / GONIOVISC) 2.5 % ophthalmic solution Place 1  drop into both eyes 3 (three) times daily as needed for dry eyes.    Marland Kitchen ibuprofen (ADVIL,MOTRIN) 200 MG tablet Take 200-400 mg by mouth every 6 (six) hours as needed for moderate pain.     Marland Kitchen KRILL OIL PO Take 2 tablets by mouth daily.    . LUTEIN PO Take 25 mg by mouth daily.     . metoprolol succinate (TOPROL-XL) 25 MG 24 hr tablet Take 1 tablet (25 mg total) by mouth daily. 90 tablet 3  . omeprazole (PRILOSEC) 40 MG capsule Take 1 capsule (40 mg total) by mouth daily. 90 capsule 3  . saccharomyces boulardii (FLORASTOR) 250 MG capsule Take 250 mg by mouth daily.    Marland Kitchen triamterene-hydrochlorothiazide (MAXZIDE-25) 37.5-25 MG  tablet Take 1 tablet by mouth daily.     No current facility-administered medications on file prior to visit.    No Known Allergies Social History   Social History  . Marital status: Widowed    Spouse name: N/A  . Number of children: 1  . Years of education: N/A   Occupational History  . Retired     Social History Main Topics  . Smoking status: Never Smoker  . Smokeless tobacco: Never Used  . Alcohol use No  . Drug use: No  . Sexual activity: Not on file   Other Topics Concern  . Not on file   Social History Narrative  . No narrative on file      Review of Systems  All other systems reviewed and are negative.      Objective:   Physical Exam  Constitutional: He appears well-developed and well-nourished.  Eyes: Conjunctivae and EOM are normal. Pupils are equal, round, and reactive to light.  Cardiovascular: Normal rate, regular rhythm and normal heart sounds.   Pulmonary/Chest: Effort normal and breath sounds normal.    See history of present illness     Assessment & Plan:  Black eye, right, initial encounter  Recommended tincture of time. This is a large hematoma but there is no active sign of bleeding. There is no neurologic deficit. There is no visual impairment. No further workup or treatment is necessary. Bruising should subside over the next 2-3 weeks if not sooner

## 2016-04-19 ENCOUNTER — Encounter: Payer: Self-pay | Admitting: Family Medicine

## 2016-04-19 ENCOUNTER — Ambulatory Visit (INDEPENDENT_AMBULATORY_CARE_PROVIDER_SITE_OTHER): Payer: Medicare Other | Admitting: Family Medicine

## 2016-04-19 VITALS — BP 136/72 | HR 60 | Temp 97.4°F | Resp 14 | Ht 71.0 in | Wt 176.0 lb

## 2016-04-19 DIAGNOSIS — I10 Essential (primary) hypertension: Secondary | ICD-10-CM

## 2016-04-19 LAB — URINALYSIS, ROUTINE W REFLEX MICROSCOPIC
BILIRUBIN URINE: NEGATIVE
Glucose, UA: NEGATIVE
Hgb urine dipstick: NEGATIVE
Ketones, ur: NEGATIVE
Leukocytes, UA: NEGATIVE
NITRITE: NEGATIVE
Protein, ur: NEGATIVE
Specific Gravity, Urine: 1.02 (ref 1.001–1.035)
pH: 7.5 (ref 5.0–8.0)

## 2016-04-19 LAB — COMPLETE METABOLIC PANEL WITH GFR
ALK PHOS: 58 U/L (ref 40–115)
ALT: 27 U/L (ref 9–46)
AST: 24 U/L (ref 10–35)
Albumin: 3.9 g/dL (ref 3.6–5.1)
BUN: 21 mg/dL (ref 7–25)
CALCIUM: 9.4 mg/dL (ref 8.6–10.3)
CO2: 30 mmol/L (ref 20–31)
Chloride: 101 mmol/L (ref 98–110)
Creat: 1.33 mg/dL — ABNORMAL HIGH (ref 0.70–1.11)
GFR, EST AFRICAN AMERICAN: 58 mL/min — AB (ref >=60)
GFR, Est Non African American: 50 mL/min — ABNORMAL LOW (ref >=60)
Glucose, Bld: 91 mg/dL (ref 70–99)
POTASSIUM: 3.5 mmol/L (ref 3.5–5.3)
Sodium: 143 mmol/L (ref 135–146)
Total Bilirubin: 0.8 mg/dL (ref 0.2–1.2)
Total Protein: 6.4 g/dL (ref 6.1–8.1)

## 2016-04-19 NOTE — Progress Notes (Signed)
Subjective:    Patient ID: Joel Bauer, male    DOB: [birth year], 83 y.o.   MRN: [id number]  HPI  04/10/16 Patient was playing tennis Friday when he took an awkward swimming with his tennis racket striking himself above his right eye on the orbital bone underneath his eyebrow. Aside from pain there was no initial problem. He even continued to play tennis for several sets. He denies any loss of consciousness, vision changes, headache, neurologic deficits. He awoke Saturday morning with a significant right sided hematoma above the right eye and bruising spreading around his right eye across his nasal bridge and down onto his left cheek. Aside from the appearance, he has no other symptoms. He denies any headache, dizziness, visual deficit, pain with extraocular movement, nausea vomiting or loss of consciousness. Overall he is doing well. He does have a significant hematoma the size of a golf ball above his right eye and bruising on the right side of his face down to his jaw and over the nasal bridge onto his left cheek.  However there is no active sign of bleeding.  At that time, my plan was: Recommended tincture of time. This is a large hematoma but there is no active sign of bleeding. There is no neurologic deficit. There is no visual impairment. No further workup or treatment is necessary. Bruising should subside over the next 2-3 weeks if not sooner  04/19/16 Patient is here today with surgery regarding his blood pressure. He is currently on a combination of metoprolol as well as Maxide to control his blood pressure. Last year he underwent surgery and lost substantial weight down to 158 pounds. For a time his blood pressure was low. However over the last year as he has regained strength, the patient's blood pressure has gradually been increasing. Now he started see blood pressure at home ranging between [id number] and 0000000 systolic. He denies any chest pain shortness of breath or dyspnea on exertion. He is  concerned that there may be a problem with his kidneys causing his blood pressure to increase. I believe is more likely due to varices recovered he is regaining his strength in his blood pressure starting to trend up Past Medical History:  Diagnosis Date  . Adenomatous colon polyp 2001  . Anemia    as a child   . Aortic regurgitation    a. mild by echo 03/2015  . Arthritis   . Cancer (HCC)    hx of skin cancer   . Diverticulitis of colon - recurrent/persistent 12/29/2014  . Diverticulosis of colon (without mention of hemorrhage) 2005  . GERD (gastroesophageal reflux disease)   . Heart murmur   . History of kidney stones   . History of skin cancer   . Hx of Clostridium difficile infection   . Hyperglycemia   . Hyperlipidemia   . Hypertension   . Hypertensive heart disease   . Macular degeneration    left eye  . Orthostasis   . Pleurisy   . Prostatitis   . 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 Retinal hemorrhage   . Shingles    Past Surgical History:  Procedure Laterality Date  . CATARACTS REMOVED    . COLONOSCOPY    . COLONOSCOPY N/A 06/15/2014   Procedure: COLONOSCOPY;  Surgeon: Gatha Mayer, MD;  Location: [facility];  Service: Endoscopy;  Laterality: N/A;  . CYSTOSCOPY WITH RETROGRADE PYELOGRAM, URETEROSCOPY AND STENT PLACEMENT Right 10/18/2013   Procedure: CYSTOSCOPY WITH RIGHT  RETROGRADE/RIGHT URETEROSCOPY, STONE BASKETRY,  AND STENT PLACEMENT RIGHT;  Surgeon: Alexis Frock, MD;  Location: [facility];  Service: Urology;  Laterality: Right;  . EYE SURGERY     3 injections to left eye for macular degeneration   . FMT  06/2014  . LAPAROSCOPIC LOW ANTERIOR RESECTION N/A 04/19/2015   Procedure: LAPAROSCOPIC ASSISTED  LOW ANTERIOR RESECTION, splenic flexure mobilization;  Surgeon: Fanny Skates, MD;  Location: [facility];  Service: General;  Laterality: N/A;  . LITHOTRIPSY    . TONSILLECTOMY     Current Outpatient Prescriptions on File Prior to Visit  Medication Sig  Dispense Refill  . CALCIUM PO Take 1 tablet by mouth daily.    . Cholecalciferol (VITAMIN D3) 2000 UNITS TABS Take 1 tablet by mouth daily.    . Coenzyme Q10 (COQ10 PO) Take 100 mg by mouth daily.     . fluticasone (FLONASE) 50 MCG/ACT nasal spray Place 1 spray into both nostrils daily as needed for allergies or rhinitis.    . hydroxypropyl methylcellulose / hypromellose (ISOPTO TEARS / GONIOVISC) 2.5 % ophthalmic solution Place 1 drop into both eyes 3 (three) times daily as needed for dry eyes.    Marland Kitchen ibuprofen (ADVIL,MOTRIN) 200 MG tablet Take 200-400 mg by mouth every 6 (six) hours as needed for moderate pain.     Marland Kitchen KRILL OIL PO Take 2 tablets by mouth daily.    . LUTEIN PO Take 25 mg by mouth daily.     . metoprolol succinate (TOPROL-XL) 25 MG 24 hr tablet Take 1 tablet (25 mg total) by mouth daily. 90 tablet 3  . omeprazole (PRILOSEC) 40 MG capsule Take 1 capsule (40 mg total) by mouth daily. 90 capsule 3  . saccharomyces boulardii (FLORASTOR) 250 MG capsule Take 250 mg by mouth daily.    Marland Kitchen triamterene-hydrochlorothiazide (MAXZIDE-25) 37.5-25 MG tablet Take 1 tablet by mouth daily.     No current facility-administered medications on file prior to visit.    No Known Allergies Social History   Social History  . Marital status: Widowed    Spouse name: N/A  . Number of children: 1  . Years of education: N/A   Occupational History  . Retired     Social History Main Topics  . Smoking status: Never Smoker  . Smokeless tobacco: Never Used  . Alcohol use No  . Drug use: No  . Sexual activity: Not on file   Other Topics Concern  . Not on file   Social History Narrative  . No narrative on file      Review of Systems  All other systems reviewed and are negative.      Objective:   Physical Exam  Constitutional: He appears well-developed and well-nourished.  Eyes: Conjunctivae and EOM are normal. Pupils are equal, round, and reactive to light.  Cardiovascular: Normal rate,  regular rhythm and normal heart sounds.   Pulmonary/Chest: Effort normal and breath sounds normal.    See history of present illness     Assessment & Plan:  Benign essential HTN - Plan: COMPLETE METABOLIC PANEL WITH GFR, Urinalysis, Routine w reflex microscopic  Continue metoprolol along with Maxzide. Check renal function with a CMP as well as a urinalysis. CMP and urinalysis are normal, I will add losartan 100 mg a day to his blood pressure medication. I believe there is a miscommunication. My nurse states that he is not taking the Maxzide. The patient believes that he is although he is a little bit confused.  Therefore he will go home and verify if in fact he's taking the Maxzide and metoprolol. He is certain he is taking metoprolol.  There is a little bit of a question of whether he is taking the Maxzide. If he is on the Maxide I will add losartan to this if his lab work is normal.Still has a significant hematoma over his right eye. Is approximately 2.5 cm in diameter. Continue to apply warm compresses for the next 2-3 weeks. If the lesion does not disappear but instead calcifies, the patient may require surgical resection by plastic surgery.

## 2016-04-21 ENCOUNTER — Other Ambulatory Visit: Payer: Self-pay | Admitting: Family Medicine

## 2016-04-21 MED ORDER — AMLODIPINE BESYLATE 10 MG PO TABS: 10.0000 mg | ORAL_TABLET | Freq: Every day | ORAL | 3 refills | Status: DC

## 2016-05-09 ENCOUNTER — Telehealth: Payer: Self-pay

## 2016-05-09 MED ORDER — VALSARTAN 160 MG PO TABS: 160.0000 mg | ORAL_TABLET | Freq: Every day | ORAL | 0 refills | Status: DC

## 2016-05-09 NOTE — Telephone Encounter (Signed)
BP is too high, I would add diovan 160 mg poqday and recheck BP here in 2 weeks.

## 2016-05-09 NOTE — Telephone Encounter (Signed)
Pt called and states he is out of town and noticed his b/p readings have been high the past three days.The readings ranged from 160/86 to  170/90  Pt states he does not have chest pain or chest tightness, no arm pain , pt states he does not have shortness of breath.   Confirmed pt was taking amLODipine, metoprolol succinate as well as triamterene-hydrochlorothiazide pt stated he was taking all three as prescribed.  Pls advise

## 2016-05-09 NOTE — Telephone Encounter (Signed)
rx sent to pharmacy lvm regarding adding new medication for pt as req by the pt

## 2016-05-29 ENCOUNTER — Encounter (HOSPITAL_COMMUNITY): Payer: Self-pay | Admitting: Emergency Medicine

## 2016-05-29 ENCOUNTER — Emergency Department (HOSPITAL_COMMUNITY)
Admission: EM | Admit: 2016-05-29 | Discharge: 2016-05-29 | Disposition: A | Discharge status: Home / Self Care | Payer: Medicare Other | Attending: Emergency Medicine | Admitting: Emergency Medicine

## 2016-05-29 ENCOUNTER — Ambulatory Visit: Payer: Medicare Other | Admitting: Physician Assistant

## 2016-05-29 ENCOUNTER — Ambulatory Visit (INDEPENDENT_AMBULATORY_CARE_PROVIDER_SITE_OTHER): Payer: Medicare Other | Admitting: Family Medicine

## 2016-05-29 VITALS — BP 136/80 | HR 80 | Temp 97.9°F | Resp 18 | Ht 71.0 in | Wt 172.0 lb

## 2016-05-29 DIAGNOSIS — R1013 Epigastric pain: Secondary | ICD-10-CM | POA: Diagnosis not present

## 2016-05-29 DIAGNOSIS — I10 Essential (primary) hypertension: Secondary | ICD-10-CM | POA: Diagnosis not present

## 2016-05-29 DIAGNOSIS — R1012 Left upper quadrant pain: Secondary | ICD-10-CM | POA: Diagnosis present

## 2016-05-29 DIAGNOSIS — Z85828 Personal history of other malignant neoplasm of skin: Secondary | ICD-10-CM | POA: Insufficient documentation

## 2016-05-29 DIAGNOSIS — K2900 Acute gastritis without bleeding: Secondary | ICD-10-CM | POA: Diagnosis not present

## 2016-05-29 DIAGNOSIS — K219 Gastro-esophageal reflux disease without esophagitis: Secondary | ICD-10-CM

## 2016-05-29 LAB — CBC WITH DIFFERENTIAL/PLATELET
BASOS ABS: 0 10*3/uL (ref 0.0–0.1)
BASOS PCT: 0 %
Eosinophils Absolute: 0.5 10*3/uL (ref 0.0–0.7)
Eosinophils Relative: 5 %
HEMATOCRIT: 43.5 % (ref 39.0–52.0)
HEMOGLOBIN: 14.9 g/dL (ref 13.0–17.0)
LYMPHS PCT: 17 %
Lymphs Abs: 1.4 10*3/uL (ref 0.7–4.0)
MCH: 29.5 pg (ref 26.0–34.0)
MCHC: 34.3 g/dL (ref 30.0–36.0)
MCV: 86.1 fL (ref 78.0–100.0)
MONO ABS: 0.6 10*3/uL (ref 0.1–1.0)
Monocytes Relative: 7 %
NEUTROS ABS: 6.1 10*3/uL (ref 1.7–7.7)
NEUTROS PCT: 71 %
Platelets: 166 10*3/uL (ref 150–400)
RBC: 5.05 MIL/uL (ref 4.22–5.81)
RDW: 14.5 % (ref 11.5–15.5)
WBC: 8.5 10*3/uL (ref 4.0–10.5)

## 2016-05-29 LAB — COMPREHENSIVE METABOLIC PANEL
ALBUMIN: 4.1 g/dL (ref 3.5–5.0)
ALK PHOS: 55 U/L (ref 38–126)
ALT: 27 U/L (ref 17–63)
ANION GAP: 8 (ref 5–15)
AST: 26 U/L (ref 15–41)
BILIRUBIN TOTAL: 1.1 mg/dL (ref 0.3–1.2)
BUN: 21 mg/dL — AB (ref 6–20)
CALCIUM: 9.3 mg/dL (ref 8.9–10.3)
CO2: 29 mmol/L (ref 22–32)
Chloride: 100 mmol/L — ABNORMAL LOW (ref 101–111)
Creatinine, Ser: 1.11 mg/dL (ref 0.61–1.24)
GFR calc Af Amer: >60 mL/min (ref >60)
GFR calc non Af Amer: >60 mL/min (ref >60)
GLUCOSE: 98 mg/dL (ref 65–99)
Potassium: 3.4 mmol/L — ABNORMAL LOW (ref 3.5–5.1)
SODIUM: 137 mmol/L (ref 135–145)
Total Protein: 7 g/dL (ref 6.5–8.1)

## 2016-05-29 LAB — CBC
HEMATOCRIT: 47.6 % (ref 38.5–50.0)
Hemoglobin: 15.8 g/dL (ref 13.0–17.0)
MCH: 30 pg (ref 27.0–33.0)
MCHC: 33.2 g/dL (ref 32.0–36.0)
MCV: 90.3 fL (ref 80.0–100.0)
PLATELETS: 177 10*3/uL (ref 140–400)
RBC: 5.27 MIL/uL (ref 4.20–5.80)
RDW: 14.9 % (ref 11.0–15.0)
WBC: 9.4 10*3/uL (ref 3.8–10.8)

## 2016-05-29 LAB — LIPASE, BLOOD: Lipase: 17 U/L (ref 11–51)

## 2016-05-29 MED ORDER — RANITIDINE HCL 150 MG PO TABS: 150.0000 mg | ORAL_TABLET | Freq: Two times a day (BID) | ORAL | 0 refills | Status: DC

## 2016-05-29 MED ORDER — ONDANSETRON HCL 4 MG/2ML IJ SOLN
4.0000 mg | Freq: Once | INTRAMUSCULAR | Status: AC
  Administered 2016-05-29: 4 mg via INTRAVENOUS
  Filled 2016-05-29: qty 2

## 2016-05-29 MED ORDER — GI COCKTAIL ~~LOC~~
30.0000 mL | Freq: Once | ORAL | Status: AC
  Administered 2016-05-29: 30 mL via ORAL
  Filled 2016-05-29: qty 30

## 2016-05-29 MED ORDER — SODIUM CHLORIDE 0.9 % IV BOLUS (SEPSIS)
500.0000 mL | Freq: Once | INTRAVENOUS | Status: AC
Start: 2016-05-29 — End: 2016-05-29
  Administered 2016-05-29: 500 mL via INTRAVENOUS

## 2016-05-29 MED ORDER — OMEPRAZOLE 40 MG PO CPDR: 40.0000 mg | DELAYED_RELEASE_CAPSULE | Freq: Every day | ORAL | 0 refills | Status: DC

## 2016-05-29 MED ORDER — ONDANSETRON 4 MG PO TBDP: 4.0000 mg | ORAL_TABLET | Freq: Three times a day (TID) | ORAL | 0 refills | Status: DC | PRN

## 2016-05-29 MED ORDER — PROMETHAZINE HCL 25 MG/ML IJ SOLN
25.0000 mg | Freq: Once | INTRAMUSCULAR | Status: AC
  Administered 2016-05-29: 25 mg via INTRAMUSCULAR

## 2016-05-29 NOTE — Addendum Note (Signed)
Addended by: Shary Decamp B on: 05/29/2016 11:09 AM   Modules accepted: Orders

## 2016-05-29 NOTE — Progress Notes (Unsigned)
Cardiology Office Note    Date:  05/29/2016   ID:  Joel Bauer, DOB 23-Apr-1933, MRN [id number]  PCP:  Odette Fraction, MD  Cardiologist: Dr. Meda Coffee  No chief complaint on file.   History of Present Illness:  Joel Bauer is a 83 y.o. male  with history of HTN, hypertensive heart disease, orthostasis following weight loss after laparoscopic low anterior resection for chronic diverticulitis, hyperlipidemia,arthritis, retinal hemorrhage, macular degeneration who presents for f/u. Saw Dr. Meda Coffee in the past for atypical chest pain with nuc 03/2015 that was normal except for hypertensive response to exercise. 2D Echo 03/2015 showed EF 60-65%, mild LVH, grade 1 DD with normal filling pressure, aortic sclerosis without steonsis, mild AI. He had palpitations treated with metoprolol and losartan was stopped.  Last saw Sharrell Ku, PA-C 9/2017and doing well.    Past Medical History:  Diagnosis Date  . Adenomatous colon polyp 2001  . Anemia    as a child   . Aortic regurgitation    a. mild by echo 03/2015  . Arthritis   . Cancer (HCC)    hx of skin cancer   . Diverticulitis of colon - recurrent/persistent 12/29/2014  . Diverticulosis of colon (without mention of hemorrhage) 2005  . GERD (gastroesophageal reflux disease)   . Heart murmur   . History of kidney stones   . History of skin cancer   . Hx of Clostridium difficile infection   . Hyperglycemia   . Hyperlipidemia   . Hypertension   . Hypertensive heart disease   . Macular degeneration    left eye  . Orthostasis   . Pleurisy   . Prostatitis   . 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 Retinal hemorrhage   . Shingles     Past Surgical History:  Procedure Laterality Date  . CATARACTS REMOVED    . COLONOSCOPY    . COLONOSCOPY N/A 06/15/2014   Procedure: COLONOSCOPY;  Surgeon: Gatha Mayer, MD;  Location: [facility];  Service: Endoscopy;  Laterality: N/A;  . CYSTOSCOPY WITH RETROGRADE PYELOGRAM,  URETEROSCOPY AND STENT PLACEMENT Right 10/18/2013   Procedure: CYSTOSCOPY WITH RIGHT RETROGRADE/RIGHT URETEROSCOPY, STONE BASKETRY,  AND STENT PLACEMENT RIGHT;  Surgeon: Alexis Frock, MD;  Location: [facility];  Service: Urology;  Laterality: Right;  . EYE SURGERY     3 injections to left eye for macular degeneration   . FMT  06/2014  . LAPAROSCOPIC LOW ANTERIOR RESECTION N/A 04/19/2015   Procedure: LAPAROSCOPIC ASSISTED  LOW ANTERIOR RESECTION, splenic flexure mobilization;  Surgeon: Fanny Skates, MD;  Location: [facility];  Service: General;  Laterality: N/A;  . LITHOTRIPSY    . TONSILLECTOMY      Current Medications: Outpatient Medications Prior to Visit  Medication Sig Dispense Refill  . amLODipine (NORVASC) 10 MG tablet Take 1 tablet (10 mg total) by mouth daily. 90 tablet 3  . CALCIUM PO Take 1 tablet by mouth daily.    . Cholecalciferol (VITAMIN D3) 2000 UNITS TABS Take 1 tablet by mouth daily.    . Coenzyme Q10 (COQ10 PO) Take 100 mg by mouth daily.     . fluticasone (FLONASE) 50 MCG/ACT nasal spray Place 1 spray into both nostrils daily as needed for allergies or rhinitis.    . hydroxypropyl methylcellulose / hypromellose (ISOPTO TEARS / GONIOVISC) 2.5 % ophthalmic solution Place 1 drop into both eyes 3 (three) times daily as needed for dry eyes.    Marland Kitchen ibuprofen (ADVIL,MOTRIN) 200 MG tablet Take  200-400 mg by mouth every 6 (six) hours as needed for moderate pain.     Marland Kitchen KRILL OIL PO Take 2 tablets by mouth daily.    . LUTEIN PO Take 25 mg by mouth daily.     . metoprolol succinate (TOPROL-XL) 25 MG 24 hr tablet Take 1 tablet (25 mg total) by mouth daily. 90 tablet 3  . omeprazole (PRILOSEC) 40 MG capsule Take 1 capsule (40 mg total) by mouth daily. 90 capsule 3  . saccharomyces boulardii (FLORASTOR) 250 MG capsule Take 250 mg by mouth daily.    Marland Kitchen triamterene-hydrochlorothiazide (MAXZIDE-25) 37.5-25 MG tablet Take 1 tablet by mouth daily.    . valsartan (DIOVAN) 160 MG tablet Take 1 tablet  (160 mg total) by mouth daily. 30 tablet 0   No facility-administered medications prior to visit.      Allergies:   Patient has no known allergies.   Social History   Social History  . Marital status: Widowed    Spouse name: N/A  . Number of children: 1  . Years of education: N/A   Occupational History  . Retired     Social History Main Topics  . Smoking status: Never Smoker  . Smokeless tobacco: Never Used  . Alcohol use No  . Drug use: No  . Sexual activity: Not on file   Other Topics Concern  . Not on file   Social History Narrative  . No narrative on file     Family History:  The patient's ***family history includes Breast cancer in his mother; Colon cancer in his father; Colon polyps in his father; Heart disease in his mother; Kidney disease in his sister.   ROS:   Please see the history of present illness.    ROS All other systems reviewed and are negative.   PHYSICAL EXAM:   VS:  There were no vitals taken for this visit.  Physical Exam  GEN: Well nourished, well developed, in no acute distress  HEENT: normal  Neck: no JVD, carotid bruits, or masses Cardiac:RRR; no murmurs, rubs, or gallops  Respiratory:  clear to auscultation bilaterally, normal work of breathing GI: soft, nontender, nondistended, + BS Ext: without cyanosis, clubbing, or edema, Good distal pulses bilaterally MS: no deformity or atrophy  Skin: warm and dry, no rash Neuro:  Alert and Oriented x 3, Strength and sensation are intact Psych: euthymic mood, full affect  Wt Readings from Last 3 Encounters:  04/19/16 176 lb (79.8 kg)  04/10/16 178 lb (80.7 kg)  12/07/15 171 lb 12.8 oz (77.9 kg)      Studies/Labs Reviewed:   EKG:  EKG is*** ordered today.  The ekg ordered today demonstrates ***  Recent Labs: 06/22/2015: Hemoglobin 13.7; Platelets 133 12/07/2015: Magnesium 2.1; TSH 0.97 04/19/2016: ALT 27; BUN 21; Creat 1.33; Potassium 3.5; Sodium 143   Lipid Panel No results found for:  CHOL, TRIG, HDL, CHOLHDL, VLDL, LDLCALC, LDLDIRECT  Additional studies/ records that were reviewed today include:  Exercise nuclear stress test: 04/12/2015  Nuclear stress EF: 61%.  Blood pressure demonstrated a hypertensive response to exercise.  There was no ST segment deviation noted during stress.  The study is normal.  This is a low risk study.  The left ventricular ejection fraction is normal (55-65%).   TTE: 04/12/2015 - Left ventricle: The cavity size was normal. Wall thickness was   increased in a pattern of mild LVH. Systolic function was normal.   The estimated ejection fraction was in the range  of 60% to 65%.   Wall motion was normal; there were no regional wall motion   abnormalities. Doppler parameters are consistent with abnormal   left ventricular relaxation (grade 1 diastolic dysfunction). The   E/e&' ratio is <8, suggesting normal LV filling pressure. - Aortic valve: Trileaflet. Sclerosis without stenosis. There was   mild regurgitation. - Mitral valve: Mildly thickened leaflets . There was trivial   regurgitation. - Left atrium: The atrium was normal in size. - Tricuspid valve: There was trivial regurgitation. - Pulmonary arteries: PA peak pressure: 25 mm Hg (S). - Systemic veins: The IVC Was not visualized.   Impressions: - LVEF 65-70%, mild LVH, normal wall motion, diastolic dysfunction   with normal LV filling pressure, aortic sclerosis with mild AI         ASSESSMENT:    No diagnosis found.   PLAN:  In order of problems listed above:      Medication Adjustments/Labs and Tests Ordered: Current medicines are reviewed at length with the patient today.  Concerns regarding medicines are outlined above.  Medication changes, Labs and Tests ordered today are listed in the Patient Instructions below. There are no Patient Instructions on file for this visit.   Sumner Boast, PA-C  05/29/2016 7:55 AM    [facility]  [facility] [address], [place], [place]  [address] Phone: [phone]; Fax: [phone]

## 2016-05-29 NOTE — ED Provider Notes (Signed)
[facility] DEPT Provider Note   CSN: [id number] Arrival date & time: 05/29/16  1622     History   Chief Complaint Chief Complaint  Patient presents with  . Abdominal Pain    HPI Joel Bauer is a 83 y.o. male.  The history is provided by the patient.  Abdominal Pain   This is a new problem. The current episode started 2 days ago. The problem occurs daily. The problem has not changed since onset.The pain is associated with eating. The pain is located in the LUQ and epigastric region. The quality of the pain is aching. The pain is mild. Associated symptoms include nausea. Pertinent negatives include fever, diarrhea, hematochezia, vomiting, constipation and hematuria. The symptoms are aggravated by eating. Nothing relieves the symptoms.    Past Medical History:  Diagnosis Date  . Adenomatous colon polyp 2001  . Anemia    as a child   . Aortic regurgitation    a. mild by echo 03/2015  . Arthritis   . Cancer (HCC)    hx of skin cancer   . Diverticulitis of colon - recurrent/persistent 12/29/2014  . Diverticulosis of colon (without mention of hemorrhage) 2005  . GERD (gastroesophageal reflux disease)   . Heart murmur   . History of kidney stones   . History of skin cancer   . Hx of Clostridium difficile infection   . Hyperglycemia   . Hyperlipidemia   . Hypertension   . Hypertensive heart disease   . Macular degeneration    left eye  . Orthostasis   . Pleurisy   . Prostatitis   . 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 Retinal hemorrhage   . Shingles     Patient Active Problem List   Diagnosis Date Noted  . Aortic regurgitation   . Hypertensive heart disease   . Hypertension   . Orthostasis   . Diverticulitis large intestine 04/19/2015  . Chest pain 03/31/2015  . Pre-operative clearance 03/31/2015  . Diverticulitis of colon - recurrent/persistent 12/29/2014  . LLQ pain 11/26/2014  . History of Clostridium difficile 12/17/2013  . History of  diverticulitis 12/17/2013  . Hx of pseudomembranous enterocolitis 04/17/2013  . Abdominal pain, left lower quadrant 04/13/2013  . Glaucoma     Past Surgical History:  Procedure Laterality Date  . CATARACTS REMOVED    . COLONOSCOPY    . COLONOSCOPY N/A 06/15/2014   Procedure: COLONOSCOPY;  Surgeon: Gatha Mayer, MD;  Location: [facility];  Service: Endoscopy;  Laterality: N/A;  . CYSTOSCOPY WITH RETROGRADE PYELOGRAM, URETEROSCOPY AND STENT PLACEMENT Right 10/18/2013   Procedure: CYSTOSCOPY WITH RIGHT RETROGRADE/RIGHT URETEROSCOPY, STONE BASKETRY,  AND STENT PLACEMENT RIGHT;  Surgeon: Alexis Frock, MD;  Location: [facility];  Service: Urology;  Laterality: Right;  . EYE SURGERY     3 injections to left eye for macular degeneration   . FMT  06/2014  . LAPAROSCOPIC LOW ANTERIOR RESECTION N/A 04/19/2015   Procedure: LAPAROSCOPIC ASSISTED  LOW ANTERIOR RESECTION, splenic flexure mobilization;  Surgeon: Fanny Skates, MD;  Location: [facility];  Service: General;  Laterality: N/A;  . LITHOTRIPSY    . TONSILLECTOMY         Home Medications    Prior to Admission medications   Medication Sig Start Date End Date Taking? Authorizing Provider  amLODipine (NORVASC) 10 MG tablet Take 1 tablet (10 mg total) by mouth daily. 04/21/16  Yes Susy Frizzle, MD  Cholecalciferol (VITAMIN D3) 2000 UNITS TABS Take 1 tablet  by mouth daily.   Yes Historical Provider, MD  Coenzyme Q10 (COQ10 PO) Take 100 mg by mouth daily.    Yes Historical Provider, MD  KRILL OIL PO Take 1 tablet by mouth daily.    Yes Historical Provider, MD  LUTEIN PO Take 25 mg by mouth daily.    Yes Historical Provider, MD  metoprolol succinate (TOPROL-XL) 25 MG 24 hr tablet Take 1 tablet (25 mg total) by mouth daily. 11/19/15  Yes Susy Frizzle, MD  omeprazole (PRILOSEC) 40 MG capsule Take 1 capsule (40 mg total) by mouth daily. Patient taking differently: Take 40 mg by mouth daily as needed (heartburn).  12/24/15  Yes Susy Frizzle, MD    OVER THE COUNTER MEDICATION Take 1 capsule by mouth daily. Cumin   Yes Historical Provider, MD  saccharomyces boulardii (FLORASTOR) 250 MG capsule Take 250 mg by mouth daily.   Yes Historical Provider, MD  triamterene-hydrochlorothiazide (DYAZIDE) 37.5-25 MG capsule Take 1 capsule by mouth daily. 05/15/16  Yes Historical Provider, MD  fluticasone (FLONASE) 50 MCG/ACT nasal spray Place 1 spray into both nostrils daily as needed for allergies or rhinitis.    Historical Provider, MD  hydroxypropyl methylcellulose / hypromellose (ISOPTO TEARS / GONIOVISC) 2.5 % ophthalmic solution Place 1 drop into both eyes 3 (three) times daily as needed for dry eyes.    Historical Provider, MD  ondansetron (ZOFRAN ODT) 4 MG disintegrating tablet Take 1 tablet (4 mg total) by mouth every 8 (eight) hours as needed for nausea or vomiting. 05/29/16   Susy Frizzle, MD  valsartan (DIOVAN) 160 MG tablet Take 1 tablet (160 mg total) by mouth daily. Patient not taking: Reported on 05/29/2016 05/09/16   Susy Frizzle, MD    Family History Family History  Problem Relation Age of Onset  . Breast cancer Mother   . Heart disease Mother   . Colon cancer Father   . Colon polyps Father   . Kidney disease Sister     Social History Social History  Substance Use Topics  . Smoking status: Never Smoker  . Smokeless tobacco: Never Used  . Alcohol use No     Allergies   Patient has no known allergies.   Review of Systems Review of Systems  Constitutional: Negative for fever.  Gastrointestinal: Positive for abdominal pain and nausea. Negative for constipation, diarrhea, hematochezia and vomiting.  Genitourinary: Negative for hematuria.  All other systems reviewed and are negative.    Physical Exam Updated Vital Signs BP 177/86 (BP Location: Right Arm)   Pulse 82   Temp 97.9 F (36.6 C) (Oral)   Resp 16   Ht 5\' 11"  (1.803 m)   Wt 172 lb (78 kg)   SpO2 99%   BMI 23.99 kg/m   Physical Exam   Constitutional: He is oriented to person, place, and time. He appears well-developed and well-nourished. No distress.  HENT:  Head: Normocephalic and atraumatic.  Nose: Nose normal.  Eyes: Conjunctivae are normal.  Neck: Neck supple. No tracheal deviation present.  Cardiovascular: Normal rate, regular rhythm and normal heart sounds.   Pulmonary/Chest: Effort normal and breath sounds normal. No respiratory distress.  Abdominal: Soft. He exhibits no distension. There is tenderness (mild epigastric). There is no rebound and no guarding.  Neurological: He is alert and oriented to person, place, and time.  Skin: Skin is warm and dry.  Psychiatric: He has a normal mood and affect.     ED Treatments / Results  Labs (  all labs ordered are listed, but only abnormal results are displayed) Labs Reviewed  COMPREHENSIVE METABOLIC PANEL - Abnormal; Notable for the following:       Result Value   Potassium 3.4 (*)    Chloride 100 (*)    BUN 21 (*)    All other components within normal limits  CBC WITH DIFFERENTIAL/PLATELET  LIPASE, BLOOD    EKG  EKG Interpretation None       Radiology No results found.  Procedures Procedures (including critical care time)   EMERGENCY DEPARTMENT BILIARY ULTRASOUND INTERPRETATION "Study: Limited Abdominal Ultrasound of the Gallbladder and Common Bile Duct."  INDICATIONS: Abdominal pain Indication: Multiple views of the gallbladder and common bile duct were obtained in real-time with a Multi-frequency probe."  PERFORMED BY:  Myself IMAGES ARCHIVED?: Yes LIMITATIONS: Body habitus INTERPRETATION: Gallbladder wall normal in thickness, Sonographic Murphy's sign abscent and Common bile duct normal in size, no stones   Medications Ordered in ED Medications  ondansetron (ZOFRAN) injection 4 mg (4 mg Intravenous Given 05/29/16 1737)  gi cocktail (Maalox,Lidocaine,Donnatal) (30 mLs Oral Given 05/29/16 1737)  sodium chloride 0.9 % bolus 500 mL (0 mLs  Intravenous Stopped 05/29/16 1825)     Initial Impression / Assessment and Plan / ED Course  I have reviewed the triage vital signs and the nursing notes.  Pertinent labs & imaging results that were available during my care of the patient were reviewed by me and considered in my medical decision making (see chart for details).     83 y.o. male presents with LUQ and epigastric abdominal pain. He reports this has worsened with nausea over thel ast few days. He tell me his PCP was concerned this may be related to gallbladder dysfunction. No stones noted on bedside scan, no abnormal LFTs or WBC elevation to suggest cholecystitis. Pt had immediate relief of symptoms with GI cocktail. He has not been taking his PI therapy and I believe this is likely mild gastritis so will restart PPI and add H2 blocker for symptomatic treatment.   Final Clinical Impressions(s) / ED Diagnoses   Final diagnoses:  Acute superficial gastritis without hemorrhage    New Prescriptions Discharge Medication List as of 05/29/2016  7:14 PM    START taking these medications   Details  ranitidine (ZANTAC) 150 MG tablet Take 1 tablet (150 mg total) by mouth 2 (two) times daily., Starting Mon 05/29/2016, Print         Leo Grosser, MD 05/30/16 [phone]

## 2016-05-29 NOTE — Progress Notes (Signed)
Subjective:    Patient ID: Joel Bauer, male    DOB: 16-Jan-1934, 83 y.o.   MRN: [id number]  HPI Patient has a significant past medical history of diverticulitis status post partial colectomy performed last year for recurrent diverticulitis. Beginning Saturday, the patient developed sudden intense epigastric abdominal pain. He describes the pain as a constant 5 out of 10 but occasionally more severe. It is located inferior to the xiphoid process. There are no exacerbating or alleviating factors. He denies any constipation. He does report nausea. He denies any vomiting. He denies any change in bowel habit. He denies any blood in his stool. He denies any melena. He denies any hematochezia. He denies any radiation of the abdominal pain. Patient had a CAT scan of the abdomen and pelvis performed in October 2016 which revealed:  1. Uncomplicated sigmoid diverticulitis. 2.  Atherosclerosis, including within the coronary arteries. 3. Stable appearance of the liver. Left hepatic lobe hemangioma. Right hepatic lobe hemangioma or perfusion anomaly. 4. Prostatomegaly. 5. Left renal cyst and bilateral too small to characterize renal lesions. A tiny interpolar left renal lesion demonstrates apparent complexity, but given presence back to 09/12/2008, is favored to represent a complex cyst. 6. Mild hepatic steatosis.  It was no evidence of an abdominal aortic aneurysm. There was no mention of gallstones although CT scan is not an ideal test to evaluate for this.  Diagnosis includes pancreatitis, peptic ulcer disease, biliary colic, small bowel obstruction although I believe this is unlikely given the fact he still play passing stool and gas, gastroenteritis.  Past Medical History:  Diagnosis Date  . Adenomatous colon polyp 2001  . Anemia    as a child   . Aortic regurgitation    a. mild by echo 03/2015  . Arthritis   . Cancer (HCC)    hx of skin cancer   . Diverticulitis of colon -  recurrent/persistent 12/29/2014  . Diverticulosis of colon (without mention of hemorrhage) 2005  . GERD (gastroesophageal reflux disease)   . Heart murmur   . History of kidney stones   . History of skin cancer   . Hx of Clostridium difficile infection   . Hyperglycemia   . Hyperlipidemia   . Hypertension   . Hypertensive heart disease   . Macular degeneration    left eye  . Orthostasis   . Pleurisy   . Prostatitis   . 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 Retinal hemorrhage   . Shingles    Past Surgical History:  Procedure Laterality Date  . CATARACTS REMOVED    . COLONOSCOPY    . COLONOSCOPY N/A 06/15/2014   Procedure: COLONOSCOPY;  Surgeon: Gatha Mayer, MD;  Location: [facility];  Service: Endoscopy;  Laterality: N/A;  . CYSTOSCOPY WITH RETROGRADE PYELOGRAM, URETEROSCOPY AND STENT PLACEMENT Right 10/18/2013   Procedure: CYSTOSCOPY WITH RIGHT RETROGRADE/RIGHT URETEROSCOPY, STONE BASKETRY,  AND STENT PLACEMENT RIGHT;  Surgeon: Alexis Frock, MD;  Location: [facility];  Service: Urology;  Laterality: Right;  . EYE SURGERY     3 injections to left eye for macular degeneration   . FMT  06/2014  . LAPAROSCOPIC LOW ANTERIOR RESECTION N/A 04/19/2015   Procedure: LAPAROSCOPIC ASSISTED  LOW ANTERIOR RESECTION, splenic flexure mobilization;  Surgeon: Fanny Skates, MD;  Location: [facility];  Service: General;  Laterality: N/A;  . LITHOTRIPSY    . TONSILLECTOMY     Current Outpatient Prescriptions on File Prior to Visit  Medication Sig Dispense Refill  .  amLODipine (NORVASC) 10 MG tablet Take 1 tablet (10 mg total) by mouth daily. 90 tablet 3  . CALCIUM PO Take 1 tablet by mouth daily.    . Cholecalciferol (VITAMIN D3) 2000 UNITS TABS Take 1 tablet by mouth daily.    . Coenzyme Q10 (COQ10 PO) Take 100 mg by mouth daily.     . fluticasone (FLONASE) 50 MCG/ACT nasal spray Place 1 spray into both nostrils daily as needed for allergies or rhinitis.    . hydroxypropyl  methylcellulose / hypromellose (ISOPTO TEARS / GONIOVISC) 2.5 % ophthalmic solution Place 1 drop into both eyes 3 (three) times daily as needed for dry eyes.    Marland Kitchen KRILL OIL PO Take 2 tablets by mouth daily.    . LUTEIN PO Take 25 mg by mouth daily.     . metoprolol succinate (TOPROL-XL) 25 MG 24 hr tablet Take 1 tablet (25 mg total) by mouth daily. 90 tablet 3  . omeprazole (PRILOSEC) 40 MG capsule Take 1 capsule (40 mg total) by mouth daily. (Patient taking differently: Take 40 mg by mouth as needed. ) 90 capsule 3  . saccharomyces boulardii (FLORASTOR) 250 MG capsule Take 250 mg by mouth daily.    Marland Kitchen triamterene-hydrochlorothiazide (MAXZIDE-25) 37.5-25 MG tablet Take 1 tablet by mouth daily.    . valsartan (DIOVAN) 160 MG tablet Take 1 tablet (160 mg total) by mouth daily. (Patient not taking: Reported on 05/29/2016) 30 tablet 0   No current facility-administered medications on file prior to visit.    No Known Allergies Social History   Social History  . Marital status: Widowed    Spouse name: N/A  . Number of children: 1  . Years of education: N/A   Occupational History  . Retired     Social History Main Topics  . Smoking status: Never Smoker  . Smokeless tobacco: Never Used  . Alcohol use No  . Drug use: No  . Sexual activity: Not on file   Other Topics Concern  . Not on file   Social History Narrative  . No narrative on file      Review of Systems  All other systems reviewed and are negative.      Objective:   Physical Exam  Constitutional: He appears well-developed and well-nourished. He appears distressed.  Cardiovascular: Normal rate, regular rhythm, normal heart sounds and intact distal pulses.   No murmur heard. Pulmonary/Chest: Effort normal and breath sounds normal. No respiratory distress. He has no wheezes. He has no rales.  Abdominal: Soft. Bowel sounds are normal. He exhibits no distension and no mass. There is no tenderness. There is no rebound and no  guarding.  Vitals reviewed.  No evidence of an acute abdomen       Assessment & Plan:  Abdominal pain, epigastric  See history of present illness for a differential diagnosis. I will try empiric treatment with 25 mg of Phenergan IM and a stat CBC. Reassess the patient in approximately 15-30 minutes. If his stomach is improving, and his CBC shows no leukocytosis, will treat the patient empirically for possible viral gastroenteritis. If the abdominal pain is intensifying or there is an elevated white blood cell count, we will need to get imaging of the abdomen to evaluate for possible biliary colic versus pancreatitis  WBC=9 reassuring After 30 minutes, the patient is starting to feel better. I suspect that he has bilateral gastroenteritis. We'll treat the patient was Zofran 4 mg every 8 hours as needed and a  bland diet. Recheck in 24 hours. Schedule right upper quadrant ultrasound pain persist

## 2016-05-29 NOTE — ED Notes (Signed)
Per EMS pt complaint of abdominal pain with associated nausea. With triage pt verbalizes abdominal pain is mid and dull in nature. Pt denies v/d.

## 2016-05-31 ENCOUNTER — Inpatient Hospital Stay (HOSPITAL_COMMUNITY)
Admission: EM | Admit: 2016-05-31 | Discharge: 2016-06-02 | DRG: 390 | Disposition: A | Discharge status: Home / Self Care | Payer: Medicare Other | Attending: Family Medicine | Admitting: Family Medicine

## 2016-05-31 ENCOUNTER — Telehealth: Payer: Self-pay | Admitting: Family Medicine

## 2016-05-31 ENCOUNTER — Encounter (HOSPITAL_COMMUNITY): Payer: Self-pay | Admitting: Emergency Medicine

## 2016-05-31 ENCOUNTER — Emergency Department (HOSPITAL_COMMUNITY): Payer: Medicare Other

## 2016-05-31 DIAGNOSIS — E876 Hypokalemia: Secondary | ICD-10-CM

## 2016-05-31 DIAGNOSIS — Z85828 Personal history of other malignant neoplasm of skin: Secondary | ICD-10-CM | POA: Diagnosis not present

## 2016-05-31 DIAGNOSIS — I10 Essential (primary) hypertension: Secondary | ICD-10-CM | POA: Diagnosis present

## 2016-05-31 DIAGNOSIS — K56609 Unspecified intestinal obstruction, unspecified as to partial versus complete obstruction: Secondary | ICD-10-CM | POA: Diagnosis not present

## 2016-05-31 DIAGNOSIS — Z7951 Long term (current) use of inhaled steroids: Secondary | ICD-10-CM

## 2016-05-31 DIAGNOSIS — E785 Hyperlipidemia, unspecified: Secondary | ICD-10-CM | POA: Diagnosis not present

## 2016-05-31 DIAGNOSIS — Z803 Family history of malignant neoplasm of breast: Secondary | ICD-10-CM

## 2016-05-31 DIAGNOSIS — I351 Nonrheumatic aortic (valve) insufficiency: Secondary | ICD-10-CM | POA: Diagnosis not present

## 2016-05-31 DIAGNOSIS — Z9842 Cataract extraction status, left eye: Secondary | ICD-10-CM

## 2016-05-31 DIAGNOSIS — Z87442 Personal history of urinary calculi: Secondary | ICD-10-CM

## 2016-05-31 DIAGNOSIS — K56600 Partial intestinal obstruction, unspecified as to cause: Secondary | ICD-10-CM | POA: Diagnosis not present

## 2016-05-31 DIAGNOSIS — Z9049 Acquired absence of other specified parts of digestive tract: Secondary | ICD-10-CM | POA: Diagnosis not present

## 2016-05-31 DIAGNOSIS — Z86010 Personal history of colonic polyps: Secondary | ICD-10-CM

## 2016-05-31 DIAGNOSIS — Z8371 Family history of colonic polyps: Secondary | ICD-10-CM

## 2016-05-31 DIAGNOSIS — R1032 Left lower quadrant pain: Secondary | ICD-10-CM | POA: Diagnosis present

## 2016-05-31 DIAGNOSIS — Z980 Intestinal bypass and anastomosis status: Secondary | ICD-10-CM | POA: Diagnosis not present

## 2016-05-31 DIAGNOSIS — H3530 Unspecified macular degeneration: Secondary | ICD-10-CM | POA: Diagnosis present

## 2016-05-31 DIAGNOSIS — R1084 Generalized abdominal pain: Secondary | ICD-10-CM

## 2016-05-31 DIAGNOSIS — M1990 Unspecified osteoarthritis, unspecified site: Secondary | ICD-10-CM | POA: Diagnosis present

## 2016-05-31 DIAGNOSIS — I119 Hypertensive heart disease without heart failure: Secondary | ICD-10-CM | POA: Diagnosis present

## 2016-05-31 DIAGNOSIS — K219 Gastro-esophageal reflux disease without esophagitis: Secondary | ICD-10-CM | POA: Diagnosis not present

## 2016-05-31 DIAGNOSIS — Z800 Family history of malignant neoplasm of digestive organs: Secondary | ICD-10-CM

## 2016-05-31 DIAGNOSIS — Z8719 Personal history of other diseases of the digestive system: Secondary | ICD-10-CM | POA: Diagnosis not present

## 2016-05-31 DIAGNOSIS — R109 Unspecified abdominal pain: Secondary | ICD-10-CM | POA: Diagnosis present

## 2016-05-31 DIAGNOSIS — Z9841 Cataract extraction status, right eye: Secondary | ICD-10-CM

## 2016-05-31 DIAGNOSIS — Z841 Family history of disorders of kidney and ureter: Secondary | ICD-10-CM

## 2016-05-31 DIAGNOSIS — Z8249 Family history of ischemic heart disease and other diseases of the circulatory system: Secondary | ICD-10-CM

## 2016-05-31 DIAGNOSIS — Z79899 Other long term (current) drug therapy: Secondary | ICD-10-CM | POA: Diagnosis not present

## 2016-05-31 HISTORY — DX: Unspecified intestinal obstruction, unspecified as to partial versus complete obstruction: K56.609

## 2016-05-31 LAB — CBC WITH DIFFERENTIAL/PLATELET
BASOS PCT: 1 %
Basophils Absolute: 0 10*3/uL (ref 0.0–0.1)
EOS ABS: 0.5 10*3/uL (ref 0.0–0.7)
Eosinophils Relative: 6 %
HEMATOCRIT: 40.3 % (ref 39.0–52.0)
HEMOGLOBIN: 13.9 g/dL (ref 13.0–17.0)
Lymphocytes Relative: 18 %
Lymphs Abs: 1.6 10*3/uL (ref 0.7–4.0)
MCH: 29.6 pg (ref 26.0–34.0)
MCHC: 34.5 g/dL (ref 30.0–36.0)
MCV: 85.7 fL (ref 78.0–100.0)
Monocytes Absolute: 0.6 10*3/uL (ref 0.1–1.0)
Monocytes Relative: 7 %
NEUTROS ABS: 6.1 10*3/uL (ref 1.7–7.7)
NEUTROS PCT: 68 %
Platelets: 158 10*3/uL (ref 150–400)
RBC: 4.7 MIL/uL (ref 4.22–5.81)
RDW: 14.3 % (ref 11.5–15.5)
WBC: 8.8 10*3/uL (ref 4.0–10.5)

## 2016-05-31 LAB — COMPREHENSIVE METABOLIC PANEL
ALBUMIN: 3.9 g/dL (ref 3.5–5.0)
ALT: 24 U/L (ref 17–63)
AST: 23 U/L (ref 15–41)
Alkaline Phosphatase: 56 U/L (ref 38–126)
Anion gap: 10 (ref 5–15)
BILIRUBIN TOTAL: 0.8 mg/dL (ref 0.3–1.2)
BUN: 22 mg/dL — AB (ref 6–20)
CALCIUM: 8.9 mg/dL (ref 8.9–10.3)
CO2: 27 mmol/L (ref 22–32)
CREATININE: 1.09 mg/dL (ref 0.61–1.24)
Chloride: 101 mmol/L (ref 101–111)
GFR calc Af Amer: >60 mL/min (ref >60)
GFR calc non Af Amer: >60 mL/min (ref >60)
GLUCOSE: 116 mg/dL — AB (ref 65–99)
Potassium: 3.1 mmol/L — ABNORMAL LOW (ref 3.5–5.1)
SODIUM: 138 mmol/L (ref 135–145)
Total Protein: 6.6 g/dL (ref 6.5–8.1)

## 2016-05-31 LAB — URINALYSIS, ROUTINE W REFLEX MICROSCOPIC
Bilirubin Urine: NEGATIVE
Glucose, UA: NEGATIVE mg/dL
Hgb urine dipstick: NEGATIVE
Ketones, ur: NEGATIVE mg/dL
LEUKOCYTES UA: NEGATIVE
NITRITE: NEGATIVE
Protein, ur: NEGATIVE mg/dL
SPECIFIC GRAVITY, URINE: 1.038 — AB (ref 1.005–1.030)
pH: 6 (ref 5.0–8.0)

## 2016-05-31 LAB — LIPASE, BLOOD: Lipase: 24 U/L (ref 11–51)

## 2016-05-31 LAB — I-STAT CG4 LACTIC ACID, ED: Lactic Acid, Venous: 1.24 mmol/L (ref 0.5–1.9)

## 2016-05-31 MED ORDER — METOPROLOL SUCCINATE ER 25 MG PO TB24
25.0000 mg | ORAL_TABLET | Freq: Every day | ORAL | Status: DC
  Administered 2016-05-31 – 2016-06-02 (×3): 25 mg via ORAL
  Filled 2016-05-31 (×3): qty 1

## 2016-05-31 MED ORDER — HYDROMORPHONE HCL 1 MG/ML IJ SOLN
0.5000 mg | Freq: Once | INTRAMUSCULAR | Status: AC
  Administered 2016-05-31: 0.5 mg via INTRAVENOUS
  Filled 2016-05-31: qty 0.5

## 2016-05-31 MED ORDER — IOPAMIDOL (ISOVUE-300) INJECTION 61%
INTRAVENOUS | Status: AC
  Administered 2016-05-31: 100 mL via INTRAVENOUS
  Filled 2016-05-31: qty 100

## 2016-05-31 MED ORDER — ONDANSETRON HCL 4 MG/2ML IJ SOLN
4.0000 mg | Freq: Once | INTRAMUSCULAR | Status: AC
  Administered 2016-05-31: 4 mg via INTRAVENOUS
  Filled 2016-05-31: qty 2

## 2016-05-31 MED ORDER — FAMOTIDINE IN NACL 20-0.9 MG/50ML-% IV SOLN
20.0000 mg | Freq: Two times a day (BID) | INTRAVENOUS | Status: DC
  Administered 2016-05-31 – 2016-06-01 (×4): 20 mg via INTRAVENOUS
  Filled 2016-05-31 (×6): qty 50

## 2016-05-31 MED ORDER — MORPHINE SULFATE (PF) 4 MG/ML IV SOLN
4.0000 mg | Freq: Once | INTRAVENOUS | Status: AC
  Administered 2016-05-31: 4 mg via INTRAVENOUS
  Filled 2016-05-31: qty 1

## 2016-05-31 MED ORDER — SODIUM CHLORIDE 0.9 % IV SOLN: INTRAVENOUS | Status: DC

## 2016-05-31 MED ORDER — ENOXAPARIN SODIUM 40 MG/0.4ML ~~LOC~~ SOLN
40.0000 mg | SUBCUTANEOUS | Status: DC
  Administered 2016-05-31 – 2016-06-01 (×2): 40 mg via SUBCUTANEOUS
  Filled 2016-05-31 (×2): qty 0.4

## 2016-05-31 MED ORDER — POTASSIUM CHLORIDE 2 MEQ/ML IV SOLN: INTRAVENOUS | Status: DC

## 2016-05-31 MED ORDER — FLUTICASONE PROPIONATE 50 MCG/ACT NA SUSP: 1.0000 | Freq: Every day | NASAL | Status: DC | PRN

## 2016-05-31 MED ORDER — POLYVINYL ALCOHOL 1.4 % OP SOLN: 1.0000 [drp] | OPHTHALMIC | Status: DC | PRN

## 2016-05-31 MED ORDER — SODIUM CHLORIDE 0.9 % IV BOLUS (SEPSIS)
1000.0000 mL | Freq: Once | INTRAVENOUS | Status: AC
  Administered 2016-05-31: 1000 mL via INTRAVENOUS

## 2016-05-31 MED ORDER — HYPROMELLOSE (GONIOSCOPIC) 2.5 % OP SOLN: 1.0000 [drp] | Freq: Three times a day (TID) | OPHTHALMIC | Status: DC | PRN

## 2016-05-31 MED ORDER — HYDROMORPHONE HCL 1 MG/ML IJ SOLN: 1.0000 mg | INTRAMUSCULAR | Status: DC | PRN

## 2016-05-31 MED ORDER — IOPAMIDOL (ISOVUE-300) INJECTION 61%
100.0000 mL | Freq: Once | INTRAVENOUS | Status: AC | PRN
  Administered 2016-05-31: 100 mL via INTRAVENOUS

## 2016-05-31 MED ORDER — POTASSIUM CHLORIDE 2 MEQ/ML IV SOLN
INTRAVENOUS | Status: DC
  Administered 2016-05-31 – 2016-06-02 (×4): via INTRAVENOUS
  Filled 2016-05-31 (×7): qty 1000

## 2016-05-31 NOTE — Consult Note (Signed)
[facility] Surgery Consult Note  Joel Bauer Jul 22, 1933  [id number].    Requesting MD: Karleen Hampshire Chief Complaint/Reason for Consult: partial SBO  HPI:  Joel Bauer is an 83yo male with h/o recurrent diverticulitis s/p lap assisted resection 04/19/15 by Dr. Dalbert Batman, who presented to the [facility] last night with 3 days of gradually worsening abdominal pain. Pain is epigastric/mid-abdomen.  Patient saw his PCP 05/29/16 who thought that the pain was caused by gastritis. He went to the ED that night because his pain was worse and had an abdominal u/s that was negative for cholelithiasis. Pain improved, but last night it became constant, severe, and sharp therefore facilitating his return to the ED. He reports associated nausea and 1 episode of loose stool yesterday. That was his last BM. He is passing flatus today. Denies emesis, fever, chills, melena, hematochezia, or dysuria. States that he has never had pain like this before.  Hospital workup: - CT scan shows mildly dilated fluid-filled small bowel with mild wall thickening and mesenteric edema; changes suggest partial obstruction with enteritis or less likely ischemia - WBC and lactic acid WNL, afebrile - LFTs and lipase WNL  - PMH significant for HTN - Abdominal surgical history: Laparoscopic assisted low anterior resection with coloproctostomy, 29 mm EEA stapled anastomosis, laparoscopic takedown of splenic flexure 04/19/15 by Dr. Dalbert Batman - Last colonoscopy 06/15/14 by Dr. Carlean Purl - Anticoagulants: none - Nonsmoker - Lives home alone. Very active: plays golf and tennis multiple times weekly  ROS: Review of Systems  Constitutional: Negative.   HENT: Negative.   Eyes: Negative.   Respiratory: Negative.   Cardiovascular: Negative.   Gastrointestinal: Positive for abdominal pain, diarrhea and nausea. Negative for blood in stool, constipation, heartburn, melena and vomiting.  Genitourinary: Negative.   Musculoskeletal: Negative.    Skin: Negative.   Neurological: Negative.   All systems reviewed and otherwise negative except for as above  Family History  Problem Relation Age of Onset  . Breast cancer Mother   . Heart disease Mother   . Colon cancer Father   . Colon polyps Father   . Kidney disease Sister     Past Medical History:  Diagnosis Date  . Adenomatous colon polyp 2001  . Anemia    as a child   . Aortic regurgitation    a. mild by echo 03/2015  . Arthritis   . Cancer (HCC)    hx of skin cancer   . Diverticulitis of colon - recurrent/persistent 12/29/2014  . Diverticulosis of colon (without mention of hemorrhage) 2005  . GERD (gastroesophageal reflux disease)   . Heart murmur   . History of kidney stones   . History of skin cancer   . Hx of Clostridium difficile infection   . Hyperglycemia   . Hyperlipidemia   . Hypertension   . Hypertensive heart disease   . Macular degeneration    left eye  . Orthostasis   . Pleurisy   . Prostatitis   . 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 Retinal hemorrhage   . Shingles     Past Surgical History:  Procedure Laterality Date  . CATARACTS REMOVED    . COLONOSCOPY    . COLONOSCOPY N/A 06/15/2014   Procedure: COLONOSCOPY;  Surgeon: Gatha Mayer, MD;  Location: [place];  Service: Endoscopy;  Laterality: N/A;  . CYSTOSCOPY WITH RETROGRADE PYELOGRAM, URETEROSCOPY AND STENT PLACEMENT Right 10/18/2013   Procedure: CYSTOSCOPY WITH RIGHT RETROGRADE/RIGHT URETEROSCOPY, STONE BASKETRY,  AND STENT PLACEMENT  RIGHT;  Surgeon: Alexis Frock, MD;  Location: [facility];  Service: Urology;  Laterality: Right;  . EYE SURGERY     3 injections to left eye for macular degeneration   . FMT  06/2014  . LAPAROSCOPIC LOW ANTERIOR RESECTION N/A 04/19/2015   Procedure: LAPAROSCOPIC ASSISTED  LOW ANTERIOR RESECTION, splenic flexure mobilization;  Surgeon: Fanny Skates, MD;  Location: [facility];  Service: General;  Laterality: N/A;  . LITHOTRIPSY    .  TONSILLECTOMY      Social History:  reports that he has never smoked. He has never used smokeless tobacco. He reports that he does not drink alcohol or use drugs.  Allergies: No Known Allergies  Medications Prior to Admission  Medication Sig Dispense Refill  . amLODipine (NORVASC) 10 MG tablet Take 1 tablet (10 mg total) by mouth daily. 90 tablet 3  . Cholecalciferol (VITAMIN D3) 2000 UNITS TABS Take 1 tablet by mouth daily.    . Coenzyme Q10 (COQ10 PO) Take 100 mg by mouth daily.     . fluticasone (FLONASE) 50 MCG/ACT nasal spray Place 1 spray into both nostrils daily as needed for allergies or rhinitis.    . hydroxypropyl methylcellulose / hypromellose (ISOPTO TEARS / GONIOVISC) 2.5 % ophthalmic solution Place 1 drop into both eyes 3 (three) times daily as needed for dry eyes.    Marland Kitchen KRILL OIL PO Take 1 tablet by mouth daily.     . LUTEIN PO Take 25 mg by mouth daily.     . metoprolol succinate (TOPROL-XL) 25 MG 24 hr tablet Take 1 tablet (25 mg total) by mouth daily. 90 tablet 3  . omeprazole (PRILOSEC) 40 MG capsule Take 1 capsule (40 mg total) by mouth daily. 30 capsule 0  . OVER THE COUNTER MEDICATION Take 1 capsule by mouth daily. Cumin    . ranitidine (ZANTAC) 150 MG tablet Take 1 tablet (150 mg total) by mouth 2 (two) times daily. 60 tablet 0  . saccharomyces boulardii (FLORASTOR) 250 MG capsule Take 250 mg by mouth daily.    Marland Kitchen triamterene-hydrochlorothiazide (DYAZIDE) 37.5-25 MG capsule Take 1 capsule by mouth daily.    . ondansetron (ZOFRAN ODT) 4 MG disintegrating tablet Take 1 tablet (4 mg total) by mouth every 8 (eight) hours as needed for nausea or vomiting. 30 tablet 0  . valsartan (DIOVAN) 160 MG tablet Take 1 tablet (160 mg total) by mouth daily. (Patient not taking: Reported on 05/29/2016) 30 tablet 0    Prior to Admission medications   Medication Sig Start Date End Date Taking? Authorizing Provider  amLODipine (NORVASC) 10 MG tablet Take 1 tablet (10 mg total) by mouth  daily. 04/21/16  Yes Susy Frizzle, MD  Cholecalciferol (VITAMIN D3) 2000 UNITS TABS Take 1 tablet by mouth daily.   Yes Historical Provider, MD  Coenzyme Q10 (COQ10 PO) Take 100 mg by mouth daily.    Yes Historical Provider, MD  fluticasone (FLONASE) 50 MCG/ACT nasal spray Place 1 spray into both nostrils daily as needed for allergies or rhinitis.   Yes Historical Provider, MD  hydroxypropyl methylcellulose / hypromellose (ISOPTO TEARS / GONIOVISC) 2.5 % ophthalmic solution Place 1 drop into both eyes 3 (three) times daily as needed for dry eyes.   Yes Historical Provider, MD  KRILL OIL PO Take 1 tablet by mouth daily.    Yes Historical Provider, MD  LUTEIN PO Take 25 mg by mouth daily.    Yes Historical Provider, MD  metoprolol succinate (TOPROL-XL) 25 MG  24 hr tablet Take 1 tablet (25 mg total) by mouth daily. 11/19/15  Yes Susy Frizzle, MD  omeprazole (PRILOSEC) 40 MG capsule Take 1 capsule (40 mg total) by mouth daily. 05/29/16  Yes Leo Grosser, MD  OVER THE COUNTER MEDICATION Take 1 capsule by mouth daily. Cumin   Yes Historical Provider, MD  ranitidine (ZANTAC) 150 MG tablet Take 1 tablet (150 mg total) by mouth 2 (two) times daily. 05/29/16  Yes Leo Grosser, MD  saccharomyces boulardii (FLORASTOR) 250 MG capsule Take 250 mg by mouth daily.   Yes Historical Provider, MD  triamterene-hydrochlorothiazide (DYAZIDE) 37.5-25 MG capsule Take 1 capsule by mouth daily. 05/15/16  Yes Historical Provider, MD  ondansetron (ZOFRAN ODT) 4 MG disintegrating tablet Take 1 tablet (4 mg total) by mouth every 8 (eight) hours as needed for nausea or vomiting. 05/29/16   Susy Frizzle, MD  valsartan (DIOVAN) 160 MG tablet Take 1 tablet (160 mg total) by mouth daily. Patient not taking: Reported on 05/29/2016 05/09/16   Susy Frizzle, MD    Blood pressure 153/70, pulse 67, temperature 98.8 F (37.1 C), temperature source Oral, resp. rate 20, height 5' 11"  (1.803 m), weight 171 lb 8.3 oz (77.8 kg), SpO2 98  %. Physical Exam: General: pleasant, WD/WN white male who is laying in bed in NAD HEENT: head is normocephalic, atraumatic.  Sclera are noninjected.  Mouth is pink and moist Heart: regular, rate, and rhythm.  No obvious murmurs, gallops, or rubs noted.  Palpable pedal pulses bilaterally Lungs: CTAB, no wheezes, rhonchi, or rales noted.  Respiratory effort nonlabored Abd: well healed vertical incision distal to umbilicus as well as multiple lap incisions, soft, ND, +BS, no masses, hernias, or organomegaly. Mild TTP epigastric region MS: all 4 extremities are symmetrical with no cyanosis, clubbing, or edema. Skin: warm and dry with no masses, lesions, or rashes Psych: A&Ox3 with an appropriate affect. Neuro: CM 2-12 intact, extremity CSM intact bilaterally, normal speech  Results for orders placed or performed during the hospital encounter of 05/31/16 (from the past 48 hour(s))  CBC with Differential     Status: None   Collection Time: 05/31/16  2:53 AM  Result Value Ref Range   WBC 8.8 4.0 - 10.5 K/uL   RBC 4.70 4.22 - 5.81 MIL/uL   Hemoglobin 13.9 13.0 - 17.0 g/dL   HCT 40.3 39.0 - 52.0 %   MCV 85.7 78.0 - 100.0 fL   MCH 29.6 26.0 - 34.0 pg   MCHC 34.5 30.0 - 36.0 g/dL   RDW 14.3 11.5 - 15.5 %   Platelets 158 150 - 400 K/uL   Neutrophils Relative % 68 %   Neutro Abs 6.1 1.7 - 7.7 K/uL   Lymphocytes Relative 18 %   Lymphs Abs 1.6 0.7 - 4.0 K/uL   Monocytes Relative 7 %   Monocytes Absolute 0.6 0.1 - 1.0 K/uL   Eosinophils Relative 6 %   Eosinophils Absolute 0.5 0.0 - 0.7 K/uL   Basophils Relative 1 %   Basophils Absolute 0.0 0.0 - 0.1 K/uL  Comprehensive metabolic panel     Status: Abnormal   Collection Time: 05/31/16  2:53 AM  Result Value Ref Range   Sodium 138 135 - 145 mmol/L   Potassium 3.1 (L) 3.5 - 5.1 mmol/L   Chloride 101 101 - 111 mmol/L   CO2 27 22 - 32 mmol/L   Glucose, Bld 116 (H) 65 - 99 mg/dL   BUN 22 (H) 6 -  20 mg/dL   Creatinine, Ser 1.09 0.61 - 1.24 mg/dL    Calcium 8.9 8.9 - 10.3 mg/dL   Total Protein 6.6 6.5 - 8.1 g/dL   Albumin 3.9 3.5 - 5.0 g/dL   AST 23 15 - 41 U/L   ALT 24 17 - 63 U/L   Alkaline Phosphatase 56 38 - 126 U/L   Total Bilirubin 0.8 0.3 - 1.2 mg/dL   GFR calc non Af Amer >60 >60 mL/min   GFR calc Af Amer >60 >60 mL/min    Comment: (NOTE) The eGFR has been calculated using the CKD EPI equation. This calculation has not been validated in all clinical situations. eGFR's persistently <60 mL/min signify possible Chronic Kidney Disease.    Anion gap 10 5 - 15  Lipase, blood     Status: None   Collection Time: 05/31/16  2:53 AM  Result Value Ref Range   Lipase 24 11 - 51 U/L  Urinalysis, Routine w reflex microscopic     Status: Abnormal   Collection Time: 05/31/16  4:55 AM  Result Value Ref Range   Color, Urine YELLOW YELLOW   APPearance CLEAR CLEAR   Specific Gravity, Urine 1.038 (H) 1.005 - 1.030   pH 6.0 5.0 - 8.0   Glucose, UA NEGATIVE NEGATIVE mg/dL   Hgb urine dipstick NEGATIVE NEGATIVE   Bilirubin Urine NEGATIVE NEGATIVE   Ketones, ur NEGATIVE NEGATIVE mg/dL   Protein, ur NEGATIVE NEGATIVE mg/dL   Nitrite NEGATIVE NEGATIVE   Leukocytes, UA NEGATIVE NEGATIVE  I-Stat CG4 Lactic Acid, ED     Status: None   Collection Time: 05/31/16  5:41 AM  Result Value Ref Range   Lactic Acid, Venous 1.24 0.5 - 1.9 mmol/L   Ct Abdomen Pelvis W Contrast  Result Date: 05/31/2016 CLINICAL DATA:  Epigastric pain and nausea for 3 days. Patient has previously been seen by primary care physician and in the ED. No relief from medications. EXAM: CT ABDOMEN AND PELVIS WITH CONTRAST TECHNIQUE: Multidetector CT imaging of the abdomen and pelvis was performed using the standard protocol following bolus administration of intravenous contrast. CONTRAST:  100 mL Isovue-300 COMPARISON:  12/29/2014 FINDINGS: Lower chest: Atelectasis in the lung bases. Coronary artery and aortic calcifications. Hepatobiliary: Mild diffuse fatty infiltration of  the liver. Peripherally enhancing lesion in the left lobe of the liver measuring 1.7 cm diameter. Homogeneously enhancing lesion in the inferior right lobe of the liver measuring 12 mm diameter. These are unchanged since previous study and probably represent cavernous hemangiomas. Gallbladder and bile ducts are unremarkable. Pancreas: Unremarkable. No pancreatic ductal dilatation or surrounding inflammatory changes. Spleen: Normal in size without focal abnormality. Adrenals/Urinary Tract: No adrenal gland nodules. subcentimeter cysts on the kidneys. Renal nephrograms are symmetrical. No hydronephrosis or hydroureter. Bladder wall is not thickened. Stomach/Bowel: Stomach is decompressed. Mildly dilated gas-filled mid abdominal small bowel with small bowel wall thickening and mesenteric edema. Changes may represent partial obstruction or enteritis. Ischemia is not excluded but felt less likely. Mesenteric vessels appear patent. No evidence of pneumatosis or portal venous gas. Distal small bowel are relatively decompressed. Scattered stool throughout the colon. No colonic distention or wall thickening. Appendix is normal. Surgical anastomosis at the mid sigmoid region. Vascular/Lymphatic: Aortic atherosclerosis. No enlarged abdominal or pelvic lymph nodes. Reproductive: Prostate gland is diffusely enlarged, measuring 5.9 cm in diameter. Other: No free air or free fluid in the abdomen. Abdominal wall musculature appears intact. Small amount of free fluid is demonstrated in the pelvis. Musculoskeletal: Degenerative  changes in the spine. IMPRESSION: Mildly dilated fluid-filled small bowel with mild wall thickening and mesenteric edema. Changes suggest partial obstruction with enteritis or less likely ischemia. Small amount of free fluid in the pelvis is likely reactive. Hemangiomas in the liver. Electronically Signed   By: Lucienne Capers M.D.   On: 05/31/2016 04:14   Anti-infectives    None         Assessment/Plan Partial SBO, possible enteritis versus ischemia - abdominal surgical history includes Laparoscopic assisted low anterior resection with coloproctostomy, 29 mm EEA stapled anastomosis, laparoscopic takedown of splenic flexure 04/19/15 by Dr. Dalbert Batman - 3 days of abdominal pain, nausea, and diarrhea x1 - CT scan shows mildly dilated fluid-filled small bowel with mild wall thickening and mesenteric edema; changes suggest partial obstruction with enteritis or less likely ischemia - WBC and lactic acid WNL, afebrile - LFTs and lipase WNL - +loose BM yesterday, passing flatus today  HTN  ID - None VTE - SCDs, lovenox FEN - IVF, NPO  Plan - Continue bowel rest. Will wait on NG tube at this time unless patient does not continue to improve or if he starts to vomit. Encourage ambulation. Will continue to follow.   Jerrye Beavers, [facility] Surgery 05/31/2016, 10:05 AM Pager: [phone] Consults: [phone] Mon-Fri 7:00 am-4:30 pm Sat-Sun 7:00 am-11:30 am

## 2016-05-31 NOTE — ED Triage Notes (Signed)
Pt [facility] from home for epigastric pain that began on Sunday; pt evaluated by PCP who dx him with virus; pain worsened and pt came to ED yesterday, given GI cocktail and told to take zofran and zantac; pt has not had relief with zofran and zantac at home;  Denies V/D, endorses nausea

## 2016-05-31 NOTE — Telephone Encounter (Signed)
Patient calling to let you know that he is in hospital and will need the order that is in for him he will not need at the time  He is in the hospital

## 2016-05-31 NOTE — Telephone Encounter (Signed)
abd ultrasound canceled as pt admitted to hosp and work up being done there.

## 2016-05-31 NOTE — H&P (Addendum)
History and Physical    APOLONIO CUTTING KKX:[id number] DOB: 09-03-1933 DOA: 05/31/2016  PCP: Odette Fraction, MD  Patient coming from: [place].   I have personally briefly reviewed patient's old medical records in [place]  Chief Complaint: abdominal pain started on Sunday.   HPI: KAIDAN HARPSTER is a 83 y.o. male with medical history significant of  Recurrent diverticulitis s/p laparoscopic resection presents to ED with complaints of abdominal pain started Sunday evening, associated with nausea, and loose stools. Pt was seen at PCP office , was told a stomach bug and to come back if symptoms persisted. He presented to ED last night with persistent abdominal pain, and nausea. No vomiting, last BM was yesterday morning. No fever or chills, . He denies any chest pain, sob, cough, headache, blurry vision, tingling or numbness, denies any dysuria symptoms. Labs in ED revealed low potassium. Further work up with CT abdomen and pelvis shows Mildly dilated fluid-filled small bowel with mild wall thickening and mesenteric edema. Changes suggest partial obstruction with enteritis or less likely ischemia. His lactic acid level was within normal limits. He was referred to medical service for admission for possible SBO.     Review of Systems: As per HPI otherwise 10 point review of systems negative.    Past Medical History:  Diagnosis Date  . Adenomatous colon polyp 2001  . Anemia    as a child   . Aortic regurgitation    a. mild by echo 03/2015  . Arthritis   . Cancer (HCC)    hx of skin cancer   . Diverticulitis of colon - recurrent/persistent 12/29/2014  . Diverticulosis of colon (without mention of hemorrhage) 2005  . GERD (gastroesophageal reflux disease)   . Heart murmur   . History of kidney stones   . History of skin cancer   . Hx of Clostridium difficile infection   . Hyperglycemia   . Hyperlipidemia   . Hypertension   . Hypertensive heart disease   . Macular degeneration     left eye  . Orthostasis   . Pleurisy   . Prostatitis   . 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 Retinal hemorrhage   . Shingles     Past Surgical History:  Procedure Laterality Date  . CATARACTS REMOVED    . COLONOSCOPY    . COLONOSCOPY N/A 06/15/2014   Procedure: COLONOSCOPY;  Surgeon: Gatha Mayer, MD;  Location: [facility];  Service: Endoscopy;  Laterality: N/A;  . CYSTOSCOPY WITH RETROGRADE PYELOGRAM, URETEROSCOPY AND STENT PLACEMENT Right 10/18/2013   Procedure: CYSTOSCOPY WITH RIGHT RETROGRADE/RIGHT URETEROSCOPY, STONE BASKETRY,  AND STENT PLACEMENT RIGHT;  Surgeon: Alexis Frock, MD;  Location: [facility];  Service: Urology;  Laterality: Right;  . EYE SURGERY     3 injections to left eye for macular degeneration   . FMT  06/2014  . LAPAROSCOPIC LOW ANTERIOR RESECTION N/A 04/19/2015   Procedure: LAPAROSCOPIC ASSISTED  LOW ANTERIOR RESECTION, splenic flexure mobilization;  Surgeon: Fanny Skates, MD;  Location: [facility];  Service: General;  Laterality: N/A;  . LITHOTRIPSY    . TONSILLECTOMY       reports that he has never smoked. He has never used smokeless tobacco. He reports that he does not drink alcohol or use drugs.  No Known Allergies  Family History  Problem Relation Age of Onset  . Breast cancer Mother   . Heart disease Mother   . Colon cancer Father   . Colon polyps Father   .  Kidney disease Sister    Family history reviewed.   Prior to Admission medications   Medication Sig Start Date End Date Taking? Authorizing Provider  amLODipine (NORVASC) 10 MG tablet Take 1 tablet (10 mg total) by mouth daily. 04/21/16  Yes Susy Frizzle, MD  Cholecalciferol (VITAMIN D3) 2000 UNITS TABS Take 1 tablet by mouth daily.   Yes Historical Provider, MD  Coenzyme Q10 (COQ10 PO) Take 100 mg by mouth daily.    Yes Historical Provider, MD  fluticasone (FLONASE) 50 MCG/ACT nasal spray Place 1 spray into both nostrils daily as needed for allergies or rhinitis.   Yes  Historical Provider, MD  hydroxypropyl methylcellulose / hypromellose (ISOPTO TEARS / GONIOVISC) 2.5 % ophthalmic solution Place 1 drop into both eyes 3 (three) times daily as needed for dry eyes.   Yes Historical Provider, MD  KRILL OIL PO Take 1 tablet by mouth daily.    Yes Historical Provider, MD  LUTEIN PO Take 25 mg by mouth daily.    Yes Historical Provider, MD  metoprolol succinate (TOPROL-XL) 25 MG 24 hr tablet Take 1 tablet (25 mg total) by mouth daily. 11/19/15  Yes Susy Frizzle, MD  omeprazole (PRILOSEC) 40 MG capsule Take 1 capsule (40 mg total) by mouth daily. 05/29/16  Yes Leo Grosser, MD  OVER THE COUNTER MEDICATION Take 1 capsule by mouth daily. Cumin   Yes Historical Provider, MD  ranitidine (ZANTAC) 150 MG tablet Take 1 tablet (150 mg total) by mouth 2 (two) times daily. 05/29/16  Yes Leo Grosser, MD  saccharomyces boulardii (FLORASTOR) 250 MG capsule Take 250 mg by mouth daily.   Yes Historical Provider, MD  triamterene-hydrochlorothiazide (DYAZIDE) 37.5-25 MG capsule Take 1 capsule by mouth daily. 05/15/16  Yes Historical Provider, MD  ondansetron (ZOFRAN ODT) 4 MG disintegrating tablet Take 1 tablet (4 mg total) by mouth every 8 (eight) hours as needed for nausea or vomiting. 05/29/16   Susy Frizzle, MD  valsartan (DIOVAN) 160 MG tablet Take 1 tablet (160 mg total) by mouth daily. Patient not taking: Reported on 05/29/2016 05/09/16   Susy Frizzle, MD    Physical Exam: Vitals:   05/31/16 0329 05/31/16 0453 05/31/16 0530 05/31/16 0634  BP: 154/72 146/69  153/70  Pulse: 67 70 71 67  Resp:  20  20  Temp:    98.8 F (37.1 C)  TempSrc:    Oral  SpO2: 95% 96% 95% 98%  Weight:    77.8 kg (171 lb 8.3 oz)  Height:    5\' 11"  (1.803 m)    Constitutional: NAD, calm, comfortable Vitals:   05/31/16 0329 05/31/16 0453 05/31/16 0530 05/31/16 0634  BP: 154/72 146/69  153/70  Pulse: 67 70 71 67  Resp:  20  20  Temp:    98.8 F (37.1 C)  TempSrc:    Oral  SpO2: 95% 96%  95% 98%  Weight:    77.8 kg (171 lb 8.3 oz)  Height:    5\' 11"  (1.803 m)   Eyes: PERRL, lids and conjunctivae normal ENMT: Mucous membranes are moist. Posterior pharynx clear of any exudate or lesions.Normal dentition.  Neck: normal, supple, no masses, no thyromegaly Respiratory: clear to auscultation bilaterally, no wheezing, no crackles. Normal respiratory effort. No accessory muscle use.  Cardiovascular: Regular rate and rhythm, no murmurs / rubs / gallops. No extremity edema. 2+ pedal pulses. No carotid bruits.  Abdomen: mild generalized tenderness in the umbilical area and left lower quadrant, No hepatosplenomegaly.  Bowel sounds positive.  Musculoskeletal: no clubbing / cyanosis. No joint deformity upper and lower extremities. Good ROM, no contractures. Normal muscle tone.  Skin: no rashes, lesions, ulcers. No induration Neurologic: CN 2-12 grossly intact. Sensation intact, DTR normal. Strength 5/5 in all 4.  Psychiatric: Normal judgment and insight. Alert and oriented x 3. Normal mood.     Labs on Admission: I have personally reviewed following labs and imaging studies  CBC:  Recent Labs Lab 05/29/16 0924 05/29/16 1736 05/31/16 0253  WBC 9.4 8.5 8.8  NEUTROABS  --  6.1 6.1  HGB 15.8 14.9 13.9  HCT 47.6 43.5 40.3  MCV 90.3 86.1 85.7  PLT 177 166 086   Basic Metabolic Panel:  Recent Labs Lab 05/29/16 1736 05/31/16 0253  NA 137 138  K 3.4* 3.1*  CL 100* 101  CO2 29 27  GLUCOSE 98 116*  BUN 21* 22*  CREATININE 1.11 1.09  CALCIUM 9.3 8.9   GFR: Estimated Creatinine Clearance: 57.6 mL/min (by C-G formula based on SCr of 1.09 mg/dL). Liver Function Tests:  Recent Labs Lab 05/29/16 1736 05/31/16 0253  AST 26 23  ALT 27 24  ALKPHOS 55 56  BILITOT 1.1 0.8  PROT 7.0 6.6  ALBUMIN 4.1 3.9    Recent Labs Lab 05/29/16 1736 05/31/16 0253  LIPASE 17 24   No results for input(s): AMMONIA in the last 168 hours. Coagulation Profile: No results for input(s):  INR, PROTIME in the last 168 hours. Cardiac Enzymes: No results for input(s): CKTOTAL, CKMB, CKMBINDEX, TROPONINI in the last 168 hours. BNP (last 3 results) No results for input(s): PROBNP in the last 8760 hours. HbA1C: No results for input(s): HGBA1C in the last 72 hours. CBG: No results for input(s): GLUCAP in the last 168 hours. Lipid Profile: No results for input(s): CHOL, HDL, LDLCALC, TRIG, CHOLHDL, LDLDIRECT in the last 72 hours. Thyroid Function Tests: No results for input(s): TSH, T4TOTAL, FREET4, T3FREE, THYROIDAB in the last 72 hours. Anemia Panel: No results for input(s): VITAMINB12, FOLATE, FERRITIN, TIBC, IRON, RETICCTPCT in the last 72 hours. Urine analysis:    Component Value Date/Time   COLORURINE YELLOW 05/31/2016 0455   APPEARANCEUR CLEAR 05/31/2016 0455   LABSPEC 1.038 (H) 05/31/2016 0455   PHURINE 6.0 05/31/2016 0455   GLUCOSEU NEGATIVE 05/31/2016 0455   HGBUR NEGATIVE 05/31/2016 0455   BILIRUBINUR NEGATIVE 05/31/2016 0455   KETONESUR NEGATIVE 05/31/2016 0455   PROTEINUR NEGATIVE 05/31/2016 0455   UROBILINOGEN 0.2 12/17/2013 1150   NITRITE NEGATIVE 05/31/2016 0455   LEUKOCYTESUR NEGATIVE 05/31/2016 0455    Radiological Exams on Admission: Ct Abdomen Pelvis W Contrast  Result Date: 05/31/2016 CLINICAL DATA:  Epigastric pain and nausea for 3 days. Patient has previously been seen by primary care physician and in the ED. No relief from medications. EXAM: CT ABDOMEN AND PELVIS WITH CONTRAST TECHNIQUE: Multidetector CT imaging of the abdomen and pelvis was performed using the standard protocol following bolus administration of intravenous contrast. CONTRAST:  100 mL Isovue-300 COMPARISON:  12/29/2014 FINDINGS: Lower chest: Atelectasis in the lung bases. Coronary artery and aortic calcifications. Hepatobiliary: Mild diffuse fatty infiltration of the liver. Peripherally enhancing lesion in the left lobe of the liver measuring 1.7 cm diameter. Homogeneously enhancing  lesion in the inferior right lobe of the liver measuring 12 mm diameter. These are unchanged since previous study and probably represent cavernous hemangiomas. Gallbladder and bile ducts are unremarkable. Pancreas: Unremarkable. No pancreatic ductal dilatation or surrounding inflammatory changes. Spleen: Normal in size without  focal abnormality. Adrenals/Urinary Tract: No adrenal gland nodules. subcentimeter cysts on the kidneys. Renal nephrograms are symmetrical. No hydronephrosis or hydroureter. Bladder wall is not thickened. Stomach/Bowel: Stomach is decompressed. Mildly dilated gas-filled mid abdominal small bowel with small bowel wall thickening and mesenteric edema. Changes may represent partial obstruction or enteritis. Ischemia is not excluded but felt less likely. Mesenteric vessels appear patent. No evidence of pneumatosis or portal venous gas. Distal small bowel are relatively decompressed. Scattered stool throughout the colon. No colonic distention or wall thickening. Appendix is normal. Surgical anastomosis at the mid sigmoid region. Vascular/Lymphatic: Aortic atherosclerosis. No enlarged abdominal or pelvic lymph nodes. Reproductive: Prostate gland is diffusely enlarged, measuring 5.9 cm in diameter. Other: No free air or free fluid in the abdomen. Abdominal wall musculature appears intact. Small amount of free fluid is demonstrated in the pelvis. Musculoskeletal: Degenerative changes in the spine. IMPRESSION: Mildly dilated fluid-filled small bowel with mild wall thickening and mesenteric edema. Changes suggest partial obstruction with enteritis or less likely ischemia. Small amount of free fluid in the pelvis is likely reactive. Hemangiomas in the liver. Electronically Signed   By: Lucienne Capers M.D.   On: 05/31/2016 04:14    EKG: not done.   Assessment/Plan Active Problems:   Abdominal pain   Small bowel obstruction  Abdominal pain and nausea, CT abdomen evidence of mildly dilated  fluid-filled small bowel with mild wall thickening and mesenteric edema. Partial small bowel obstruction:  Admit for bowel rest, hydration .  No vomiting.  NPO, bowel rest and replete electrolytes. Pain control and anti emetics.  Currently he is able to pass flatulence, and last bm was yesterday morning. His lactic acid level normal and no hematochezia.  Surgery will be consulted for further recommendations.     Hypokalemia: replete as needed.    Hypertension: bp parameters stable.  Holding diuretic . Resume amlodipine.    DVT prophylaxis: Lovenox.  Code Status: full code.  Family Communication: none at bedside.  Disposition Plan: pending resolution of SBO.  Consults called: surgery consult Admission status: obs med surg.    Hosie Poisson MD [facility] Pager [phone]  If 7PM-7AM, please contact night-coverage [email or web address] Password [facility]  05/31/2016, 8:53 AM

## 2016-05-31 NOTE — ED Notes (Signed)
Urinal at bedside.  

## 2016-05-31 NOTE — ED Provider Notes (Signed)
[facility] DEPT Provider Note   CSN: [id number] Arrival date & time: 05/31/16  0207     History   Chief Complaint Chief Complaint  Patient presents with  . Abdominal Pain    HPI Joel Bauer is a 83 y.o. male presents emergency Department with chief complaint of abdominal pain. Patient was seen on 312 for the same and diagnosed with gastritis. The patient states that his symptoms began after eating a meal and he has constant 3 out of 4. Epigastric pain with episodes of severe sharp pain. The patient also had an right upper quadrant ultrasound that showed no gallstones or cholecystitis ordered by his PCP. Patient states that after he left the emergency department on the 12th, heaves, symptoms improved. However, they returned today and have been progressively worsening. He has associated nausea without vomiting. He denies chest pain, shortness of breath. The pain does not radiate to his back. He has had a couple of loose stools today but denies diarrhea. Patient also has a history of previous cold colon resection. He denies a history of small bowel obstruction.  HPI  Past Medical History:  Diagnosis Date  . Adenomatous colon polyp 2001  . Anemia    as a child   . Aortic regurgitation    a. mild by echo 03/2015  . Arthritis   . Cancer (HCC)    hx of skin cancer   . Diverticulitis of colon - recurrent/persistent 12/29/2014  . Diverticulosis of colon (without mention of hemorrhage) 2005  . GERD (gastroesophageal reflux disease)   . Heart murmur   . History of kidney stones   . History of skin cancer   . Hx of Clostridium difficile infection   . Hyperglycemia   . Hyperlipidemia   . Hypertension   . Hypertensive heart disease   . Macular degeneration    left eye  . Orthostasis   . Pleurisy   . Prostatitis   . 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 Retinal hemorrhage   . Shingles     Patient Active Problem List   Diagnosis Date Noted  . Aortic  regurgitation   . Hypertensive heart disease   . Hypertension   . Orthostasis   . Diverticulitis large intestine 04/19/2015  . Chest pain 03/31/2015  . Pre-operative clearance 03/31/2015  . Diverticulitis of colon - recurrent/persistent 12/29/2014  . LLQ pain 11/26/2014  . History of Clostridium difficile 12/17/2013  . History of diverticulitis 12/17/2013  . Hx of pseudomembranous enterocolitis 04/17/2013  . Abdominal pain, left lower quadrant 04/13/2013  . Glaucoma     Past Surgical History:  Procedure Laterality Date  . CATARACTS REMOVED    . COLONOSCOPY    . COLONOSCOPY N/A 06/15/2014   Procedure: COLONOSCOPY;  Surgeon: Gatha Mayer, MD;  Location: [facility];  Service: Endoscopy;  Laterality: N/A;  . CYSTOSCOPY WITH RETROGRADE PYELOGRAM, URETEROSCOPY AND STENT PLACEMENT Right 10/18/2013   Procedure: CYSTOSCOPY WITH RIGHT RETROGRADE/RIGHT URETEROSCOPY, STONE BASKETRY,  AND STENT PLACEMENT RIGHT;  Surgeon: Alexis Frock, MD;  Location: [facility];  Service: Urology;  Laterality: Right;  . EYE SURGERY     3 injections to left eye for macular degeneration   . FMT  06/2014  . LAPAROSCOPIC LOW ANTERIOR RESECTION N/A 04/19/2015   Procedure: LAPAROSCOPIC ASSISTED  LOW ANTERIOR RESECTION, splenic flexure mobilization;  Surgeon: Fanny Skates, MD;  Location: [facility];  Service: General;  Laterality: N/A;  . LITHOTRIPSY    . TONSILLECTOMY  Home Medications    Prior to Admission medications   Medication Sig Start Date End Date Taking? Authorizing Provider  amLODipine (NORVASC) 10 MG tablet Take 1 tablet (10 mg total) by mouth daily. 04/21/16   Susy Frizzle, MD  Cholecalciferol (VITAMIN D3) 2000 UNITS TABS Take 1 tablet by mouth daily.    Historical Provider, MD  Coenzyme Q10 (COQ10 PO) Take 100 mg by mouth daily.     Historical Provider, MD  fluticasone (FLONASE) 50 MCG/ACT nasal spray Place 1 spray into both nostrils daily as needed for allergies or rhinitis.    Historical  Provider, MD  hydroxypropyl methylcellulose / hypromellose (ISOPTO TEARS / GONIOVISC) 2.5 % ophthalmic solution Place 1 drop into both eyes 3 (three) times daily as needed for dry eyes.    Historical Provider, MD  KRILL OIL PO Take 1 tablet by mouth daily.     Historical Provider, MD  LUTEIN PO Take 25 mg by mouth daily.     Historical Provider, MD  metoprolol succinate (TOPROL-XL) 25 MG 24 hr tablet Take 1 tablet (25 mg total) by mouth daily. 11/19/15   Susy Frizzle, MD  omeprazole (PRILOSEC) 40 MG capsule Take 1 capsule (40 mg total) by mouth daily. 05/29/16   Leo Grosser, MD  ondansetron (ZOFRAN ODT) 4 MG disintegrating tablet Take 1 tablet (4 mg total) by mouth every 8 (eight) hours as needed for nausea or vomiting. 05/29/16   Susy Frizzle, MD  OVER THE COUNTER MEDICATION Take 1 capsule by mouth daily. Cumin    Historical Provider, MD  ranitidine (ZANTAC) 150 MG tablet Take 1 tablet (150 mg total) by mouth 2 (two) times daily. 05/29/16   Leo Grosser, MD  saccharomyces boulardii (FLORASTOR) 250 MG capsule Take 250 mg by mouth daily.    Historical Provider, MD  triamterene-hydrochlorothiazide (DYAZIDE) 37.5-25 MG capsule Take 1 capsule by mouth daily. 05/15/16   Historical Provider, MD  valsartan (DIOVAN) 160 MG tablet Take 1 tablet (160 mg total) by mouth daily. Patient not taking: Reported on 05/29/2016 05/09/16   Susy Frizzle, MD    Family History Family History  Problem Relation Age of Onset  . Breast cancer Mother   . Heart disease Mother   . Colon cancer Father   . Colon polyps Father   . Kidney disease Sister     Social History Social History  Substance Use Topics  . Smoking status: Never Smoker  . Smokeless tobacco: Never Used  . Alcohol use No     Allergies   Patient has no known allergies.   Review of Systems Review of Systems  Ten systems reviewed and are negative for acute change, except as noted in the HPI.   Physical Exam Updated Vital Signs BP  161/83 (BP Location: Right Arm)   Pulse 74   Temp 97.9 F (36.6 C) (Axillary)   Resp 20   SpO2 98%   Physical Exam  Constitutional: He appears well-developed and well-nourished. No distress.  HENT:  Head: Normocephalic and atraumatic.  Eyes: Conjunctivae and EOM are normal. Pupils are equal, round, and reactive to light. No scleral icterus.  Neck: Normal range of motion. Neck supple.  Cardiovascular: Normal rate, regular rhythm and normal heart sounds.   Pulmonary/Chest: Effort normal and breath sounds normal. No respiratory distress.  Abdominal: Soft. He exhibits no distension. There is no tenderness. There is no guarding.  Musculoskeletal: He exhibits no edema.  Neurological: He is alert.  Skin: Skin is warm and  dry. He is not diaphoretic.  Psychiatric: His behavior is normal.  Nursing note and vitals reviewed.    ED Treatments / Results  Labs (all labs ordered are listed, but only abnormal results are displayed) Labs Reviewed  CBC WITH DIFFERENTIAL/PLATELET  URINALYSIS, ROUTINE W REFLEX MICROSCOPIC  COMPREHENSIVE METABOLIC PANEL  LIPASE, BLOOD    EKG  EKG Interpretation None       Radiology No results found.  Procedures Procedures (including critical care time)  Medications Ordered in ED Medications  ondansetron (ZOFRAN) injection 4 mg (not administered)  morphine 4 MG/ML injection 4 mg (not administered)     Initial Impression / Assessment and Plan / ED Course  I have reviewed the triage vital signs and the nursing notes.  Pertinent labs & imaging results that were available during my care of the patient were reviewed by me and considered in my medical decision making (see chart for details).  Clinical Course as of Jun 05 11  Wed May 31, 2016  0533 Labs and imaging reviewed. Patient will be admitted for partial  SBO vs. Enteritis. I have placed a consult to surgery.   [AH]    Clinical Course User Index [AH] Margarita Mail, PA-C     patient  admitted for abdominal pain and possible sbo. Surgery will consult.  Appears safe for admission.   Final Clinical Impressions(s) / ED Diagnoses   Final diagnoses:  Generalized abdominal pain    New Prescriptions Discharge Medication List as of 06/02/2016 12:24 PM       Margarita Mail, PA-C 06/04/16 0013    Margarita Mail, PA-C 06/04/16 0013    Margarita Mail, PA-C 06/04/16 0016    Shanon Rosser, MD 06/05/16 2130

## 2016-05-31 NOTE — ED Notes (Signed)
Bed: WA09 Expected date:  Expected time:  Means of arrival:  Comments: 83 yr old epigastric pain

## 2016-06-01 ENCOUNTER — Observation Stay (HOSPITAL_COMMUNITY): Payer: Medicare Other

## 2016-06-01 DIAGNOSIS — R109 Unspecified abdominal pain: Secondary | ICD-10-CM | POA: Diagnosis not present

## 2016-06-01 DIAGNOSIS — R1084 Generalized abdominal pain: Secondary | ICD-10-CM | POA: Diagnosis not present

## 2016-06-01 DIAGNOSIS — K56609 Unspecified intestinal obstruction, unspecified as to partial versus complete obstruction: Secondary | ICD-10-CM | POA: Diagnosis not present

## 2016-06-01 DIAGNOSIS — K56600 Partial intestinal obstruction, unspecified as to cause: Secondary | ICD-10-CM | POA: Diagnosis not present

## 2016-06-01 MED ORDER — POTASSIUM CHLORIDE CRYS ER 20 MEQ PO TBCR
40.0000 meq | EXTENDED_RELEASE_TABLET | Freq: Every day | ORAL | Status: DC
  Administered 2016-06-01 – 2016-06-02 (×2): 40 meq via ORAL
  Filled 2016-06-01 (×2): qty 2

## 2016-06-01 NOTE — Progress Notes (Signed)
Patient ID: Joel Bauer, male   DOB: [birth year], 83 y.o.   MRN: [id number]  [facility] Surgery Progress Note     Subjective: Tolerated clears yesterday with slight increase in abdominal pain. Feeling better this morning. Denies bloating, nausea, or vomiting. BM yesterday but none today, continues to pass flatus today.  Objective: Vital signs in last 24 hours: Temp:  [97.3 F (36.3 C)-98.2 F (36.8 C)] 98.2 F (36.8 C) (03/15 0537) Pulse Rate:  [62-65] 65 (03/15 0537) Resp:  [20] 20 (03/15 0537) BP: (109-126)/(64-69) 126/64 (03/15 0537) SpO2:  [94 %-98 %] 94 % (03/15 0537) Last BM Date: 05/31/16 (per patient)  Intake/Output from previous day: 03/14 0701 - 03/15 0700 In: 3251.3 [P.O.:1740; I.V.:1411.3; IV Piggyback:100] Out: 825 [Urine:825] Intake/Output this shift: No intake/output data recorded.  PE: Gen:  Alert, NAD, pleasant Pulm:  Effort normal Abd: soft, ND, +BS, no masses, hernias, or organomegaly. Mild TTP epigastric region Ext:  No erythema, edema, or tenderness   Lab Results:   Recent Labs  05/29/16 1736 05/31/16 0253  WBC 8.5 8.8  HGB 14.9 13.9  HCT 43.5 40.3  PLT 166 158   BMET  Recent Labs  05/29/16 1736 05/31/16 0253  NA 137 138  K 3.4* 3.1*  CL 100* 101  CO2 29 27  GLUCOSE 98 116*  BUN 21* 22*  CREATININE 1.11 1.09  CALCIUM 9.3 8.9   PT/INR No results for input(s): LABPROT, INR in the last 72 hours. CMP     Component Value Date/Time   NA 138 05/31/2016 0253   K 3.1 (L) 05/31/2016 0253   CL 101 05/31/2016 0253   CO2 27 05/31/2016 0253   GLUCOSE 116 (H) 05/31/2016 0253   BUN 22 (H) 05/31/2016 0253   CREATININE 1.09 05/31/2016 0253   CREATININE 1.33 (H) 04/19/2016 1206   CALCIUM 8.9 05/31/2016 0253   PROT 6.6 05/31/2016 0253   ALBUMIN 3.9 05/31/2016 0253   AST 23 05/31/2016 0253   ALT 24 05/31/2016 0253   ALKPHOS 56 05/31/2016 0253   BILITOT 0.8 05/31/2016 0253   GFRNONAA >60 05/31/2016 0253   GFRNONAA 50 (L)  04/19/2016 1206   GFRAA >60 05/31/2016 0253   GFRAA 58 (L) 04/19/2016 1206   Lipase     Component Value Date/Time   LIPASE 24 05/31/2016 0253       Studies/Results: Ct Abdomen Pelvis W Contrast  Result Date: 05/31/2016 CLINICAL DATA:  Epigastric pain and nausea for 3 days. Patient has previously been seen by primary care physician and in the ED. No relief from medications. EXAM: CT ABDOMEN AND PELVIS WITH CONTRAST TECHNIQUE: Multidetector CT imaging of the abdomen and pelvis was performed using the standard protocol following bolus administration of intravenous contrast. CONTRAST:  100 mL Isovue-300 COMPARISON:  12/29/2014 FINDINGS: Lower chest: Atelectasis in the lung bases. Coronary artery and aortic calcifications. Hepatobiliary: Mild diffuse fatty infiltration of the liver. Peripherally enhancing lesion in the left lobe of the liver measuring 1.7 cm diameter. Homogeneously enhancing lesion in the inferior right lobe of the liver measuring 12 mm diameter. These are unchanged since previous study and probably represent cavernous hemangiomas. Gallbladder and bile ducts are unremarkable. Pancreas: Unremarkable. No pancreatic ductal dilatation or surrounding inflammatory changes. Spleen: Normal in size without focal abnormality. Adrenals/Urinary Tract: No adrenal gland nodules. subcentimeter cysts on the kidneys. Renal nephrograms are symmetrical. No hydronephrosis or hydroureter. Bladder wall is not thickened. Stomach/Bowel: Stomach is decompressed. Mildly dilated gas-filled mid abdominal small bowel with small bowel  wall thickening and mesenteric edema. Changes may represent partial obstruction or enteritis. Ischemia is not excluded but felt less likely. Mesenteric vessels appear patent. No evidence of pneumatosis or portal venous gas. Distal small bowel are relatively decompressed. Scattered stool throughout the colon. No colonic distention or wall thickening. Appendix is normal. Surgical  anastomosis at the mid sigmoid region. Vascular/Lymphatic: Aortic atherosclerosis. No enlarged abdominal or pelvic lymph nodes. Reproductive: Prostate gland is diffusely enlarged, measuring 5.9 cm in diameter. Other: No free air or free fluid in the abdomen. Abdominal wall musculature appears intact. Small amount of free fluid is demonstrated in the pelvis. Musculoskeletal: Degenerative changes in the spine. IMPRESSION: Mildly dilated fluid-filled small bowel with mild wall thickening and mesenteric edema. Changes suggest partial obstruction with enteritis or less likely ischemia. Small amount of free fluid in the pelvis is likely reactive. Hemangiomas in the liver. Electronically Signed   By: Lucienne Capers M.D.   On: 05/31/2016 04:14   Dg Abd 2 Views  Result Date: 06/01/2016 CLINICAL DATA:  Epigastric pain and nausea for 3 days, small bowel obstruction EXAM: ABDOMEN - 2 VIEW COMPARISON:  CT abdomen and pelvis 05/31/2016 FINDINGS: Persistent dilatation of a few mid abdominal small bowel loops. Less small bowel distention than expected from previous CT exam. No definite free intraperitoneal air. Bones demineralized. Numerous pelvic phleboliths. Visualized lung bases clear. IMPRESSION: Decreased small bowel distention versus prior CT exam though a few dilated small bowel loops are still identified in the mid abdomen. Electronically Signed   By: Lavonia Dana M.D.   On: 06/01/2016 11:15    Anti-infectives: Anti-infectives    None       Assessment/Plan Partial SBO, possible enteritis versus ischemia - abdominal surgical history includes Laparoscopic assisted low anterior resection with coloproctostomy, 29 mm EEA stapled anastomosis, laparoscopic takedown of splenic flexure 04/19/15 by Dr. Dalbert Batman - 3 days of abdominal pain, nausea, and diarrhea x1 - CT scan shows mildly dilated fluid-filled small bowel with mild wall thickening and mesenteric edema; changes suggest partial obstruction with enteritis or  less likely ischemia - WBC and lactic acid WNL, afebrile - LFTs and lipase WNL  HTN  ID - None VTE - SCDs, lovenox FEN - IVF, soft diet  Plan - Some abdominal pain yesterday with clear liquids, a little better today. Continues to pass flatus. Agree with soft diet today. If he tolerates this well he is ok for discharge later today from our standpoint.   LOS: 1 day    Jerrye Beavers , [facility] Surgery 06/01/2016, 11:53 AM Pager: [phone] Consults: [phone] Mon-Fri 7:00 am-4:30 pm Sat-Sun 7:00 am-11:30 am

## 2016-06-01 NOTE — Progress Notes (Signed)
PROGRESS NOTE    Joel Bauer  IWL:[id number] DOB: Sep 22, 1933 DOA: 05/31/2016 PCP: Odette Fraction, MD  Outpatient Specialists:     Brief Narrative:  83 y/o \ Diverticulitis s/p resection admitted 3/154 with n/v/d ED work-up showed ? SBO Admitted and gen surg conslted   Assessment & Plan:   Active Problems:   History of diverticulitis   LLQ pain   Hypertension   Abdominal pain   Small bowel obstruction   Hypokalemia   PArtial SBO-resolving-RPt AXR in am.  Low threshold Abx has a h/o Diverticulitis-get cbc am-cont D5-soft diet per gen surg-hold NGT.  D/c tele and may shower Htn-cot metoprolol  xl 25 daily Hypokalemia-repalcing IVf and K in it    Consultants:   gen surg  Procedures:   none  Antimicrobials:   none    Subjective: Fair Some abd pain this am No n/v fever but central abd discomfot  Objective: Vitals:   05/31/16 1231 05/31/16 2058 06/01/16 0537 06/01/16 1356  BP: 125/67 109/69 126/64 (!) 120/47  Pulse: 62 65 65 65  Resp:  20 20 18   Temp: 97.3 F (36.3 C) 97.7 F (36.5 C) 98.2 F (36.8 C) 97.7 F (36.5 C)  TempSrc: Oral Oral Oral Oral  SpO2: 97% 98% 94% 99%  Weight:      Height:        Intake/Output Summary (Last 24 hours) at 06/01/16 1551 Last data filed at 06/01/16 0600  Gross per 24 hour  Intake          2981.25 ml  Output              825 ml  Net          2156.25 ml   Filed Weights   05/31/16 0634  Weight: 77.8 kg (171 lb 8.3 oz)    Examination:  General exam: Appears calm Respiratory system: Clear to auscultation Cardiovascular system: S1 & S2 heard, RRR. No JVD Gastrointestinal system: nondistended, soft, but mildly tender in epigastrium Central nervous system: Alert and oriented. No focal neurological deficits. Extremities: Symmetric 5 x 5 power. Skin: No rashes, lesions or ulcers Psychiatry: Judgement and insight appear normal. Mood & affect appropriate.     Data Reviewed: I have personally reviewed  following labs and imaging studies  CBC:  Recent Labs Lab 05/29/16 0924 05/29/16 1736 05/31/16 0253  WBC 9.4 8.5 8.8  NEUTROABS  --  6.1 6.1  HGB 15.8 14.9 13.9  HCT 47.6 43.5 40.3  MCV 90.3 86.1 85.7  PLT 177 166 174   Basic Metabolic Panel:  Recent Labs Lab 05/29/16 1736 05/31/16 0253  NA 137 138  K 3.4* 3.1*  CL 100* 101  CO2 29 27  GLUCOSE 98 116*  BUN 21* 22*  CREATININE 1.11 1.09  CALCIUM 9.3 8.9   GFR: Estimated Creatinine Clearance: 57.6 mL/min (by C-G formula based on SCr of 1.09 mg/dL). Liver Function Tests:  Recent Labs Lab 05/29/16 1736 05/31/16 0253  AST 26 23  ALT 27 24  ALKPHOS 55 56  BILITOT 1.1 0.8  PROT 7.0 6.6  ALBUMIN 4.1 3.9    Recent Labs Lab 05/29/16 1736 05/31/16 0253  LIPASE 17 24   No results for input(s): AMMONIA in the last 168 hours. Coagulation Profile: No results for input(s): INR, PROTIME in the last 168 hours. Cardiac Enzymes: No results for input(s): CKTOTAL, CKMB, CKMBINDEX, TROPONINI in the last 168 hours. BNP (last 3 results) No results for input(s): PROBNP in the last 8760  hours. HbA1C: No results for input(s): HGBA1C in the last 72 hours. CBG: No results for input(s): GLUCAP in the last 168 hours. Lipid Profile: No results for input(s): CHOL, HDL, LDLCALC, TRIG, CHOLHDL, LDLDIRECT in the last 72 hours. Thyroid Function Tests: No results for input(s): TSH, T4TOTAL, FREET4, T3FREE, THYROIDAB in the last 72 hours. Anemia Panel: No results for input(s): VITAMINB12, FOLATE, FERRITIN, TIBC, IRON, RETICCTPCT in the last 72 hours. Urine analysis:    Component Value Date/Time   COLORURINE YELLOW 05/31/2016 0455   APPEARANCEUR CLEAR 05/31/2016 0455   LABSPEC 1.038 (H) 05/31/2016 0455   PHURINE 6.0 05/31/2016 0455   GLUCOSEU NEGATIVE 05/31/2016 0455   HGBUR NEGATIVE 05/31/2016 0455   BILIRUBINUR NEGATIVE 05/31/2016 0455   KETONESUR NEGATIVE 05/31/2016 0455   PROTEINUR NEGATIVE 05/31/2016 0455   UROBILINOGEN  0.2 12/17/2013 1150   NITRITE NEGATIVE 05/31/2016 0455   LEUKOCYTESUR NEGATIVE 05/31/2016 0455   Sepsis Labs: @LABRCNTIP (procalcitonin:4,lacticidven:4)  )No results found for this or any previous visit (from the past 240 hour(s)).       Radiology Studies: Ct Abdomen Pelvis W Contrast  Result Date: 05/31/2016 CLINICAL DATA:  Epigastric pain and nausea for 3 days. Patient has previously been seen by primary care physician and in the ED. No relief from medications. EXAM: CT ABDOMEN AND PELVIS WITH CONTRAST TECHNIQUE: Multidetector CT imaging of the abdomen and pelvis was performed using the standard protocol following bolus administration of intravenous contrast. CONTRAST:  100 mL Isovue-300 COMPARISON:  12/29/2014 FINDINGS: Lower chest: Atelectasis in the lung bases. Coronary artery and aortic calcifications. Hepatobiliary: Mild diffuse fatty infiltration of the liver. Peripherally enhancing lesion in the left lobe of the liver measuring 1.7 cm diameter. Homogeneously enhancing lesion in the inferior right lobe of the liver measuring 12 mm diameter. These are unchanged since previous study and probably represent cavernous hemangiomas. Gallbladder and bile ducts are unremarkable. Pancreas: Unremarkable. No pancreatic ductal dilatation or surrounding inflammatory changes. Spleen: Normal in size without focal abnormality. Adrenals/Urinary Tract: No adrenal gland nodules. subcentimeter cysts on the kidneys. Renal nephrograms are symmetrical. No hydronephrosis or hydroureter. Bladder wall is not thickened. Stomach/Bowel: Stomach is decompressed. Mildly dilated gas-filled mid abdominal small bowel with small bowel wall thickening and mesenteric edema. Changes may represent partial obstruction or enteritis. Ischemia is not excluded but felt less likely. Mesenteric vessels appear patent. No evidence of pneumatosis or portal venous gas. Distal small bowel are relatively decompressed. Scattered stool throughout  the colon. No colonic distention or wall thickening. Appendix is normal. Surgical anastomosis at the mid sigmoid region. Vascular/Lymphatic: Aortic atherosclerosis. No enlarged abdominal or pelvic lymph nodes. Reproductive: Prostate gland is diffusely enlarged, measuring 5.9 cm in diameter. Other: No free air or free fluid in the abdomen. Abdominal wall musculature appears intact. Small amount of free fluid is demonstrated in the pelvis. Musculoskeletal: Degenerative changes in the spine. IMPRESSION: Mildly dilated fluid-filled small bowel with mild wall thickening and mesenteric edema. Changes suggest partial obstruction with enteritis or less likely ischemia. Small amount of free fluid in the pelvis is likely reactive. Hemangiomas in the liver. Electronically Signed   By: Lucienne Capers M.D.   On: 05/31/2016 04:14   Dg Abd 2 Views  Result Date: 06/01/2016 CLINICAL DATA:  Epigastric pain and nausea for 3 days, small bowel obstruction EXAM: ABDOMEN - 2 VIEW COMPARISON:  CT abdomen and pelvis 05/31/2016 FINDINGS: Persistent dilatation of a few mid abdominal small bowel loops. Less small bowel distention than expected from previous CT exam. No definite  free intraperitoneal air. Bones demineralized. Numerous pelvic phleboliths. Visualized lung bases clear. IMPRESSION: Decreased small bowel distention versus prior CT exam though a few dilated small bowel loops are still identified in the mid abdomen. Electronically Signed   By: Lavonia Dana M.D.   On: 06/01/2016 11:15        Scheduled Meds: . enoxaparin (LOVENOX) injection  40 mg Subcutaneous Q24H  . famotidine (PEPCID) IV  20 mg Intravenous Q12H  . metoprolol succinate  25 mg Oral Daily  . potassium chloride  40 mEq Oral Daily   Continuous Infusions: . dextrose 5 %-0.9% nacl with kcl 75 mL/hr at 06/01/16 1439     LOS: 1 day    Time spent: [place], MD [facility]   If 7PM-7AM, please contact  night-coverage [email or web address] Password [facility] 06/01/2016, 3:51 PM

## 2016-06-02 ENCOUNTER — Observation Stay (HOSPITAL_COMMUNITY): Payer: Medicare Other

## 2016-06-02 DIAGNOSIS — R109 Unspecified abdominal pain: Secondary | ICD-10-CM | POA: Diagnosis not present

## 2016-06-02 DIAGNOSIS — K56600 Partial intestinal obstruction, unspecified as to cause: Secondary | ICD-10-CM | POA: Diagnosis not present

## 2016-06-02 DIAGNOSIS — R1084 Generalized abdominal pain: Secondary | ICD-10-CM | POA: Diagnosis not present

## 2016-06-02 LAB — CBC WITH DIFFERENTIAL/PLATELET
Basophils Absolute: 0 10*3/uL (ref 0.0–0.1)
Basophils Relative: 1 %
EOS ABS: 0.5 10*3/uL (ref 0.0–0.7)
EOS PCT: 11 %
HCT: 34 % — ABNORMAL LOW (ref 39.0–52.0)
HEMOGLOBIN: 11.9 g/dL — AB (ref 13.0–17.0)
LYMPHS ABS: 1.6 10*3/uL (ref 0.7–4.0)
Lymphocytes Relative: 37 %
MCH: 30.4 pg (ref 26.0–34.0)
MCHC: 35 g/dL (ref 30.0–36.0)
MCV: 87 fL (ref 78.0–100.0)
MONOS PCT: 6 %
Monocytes Absolute: 0.3 10*3/uL (ref 0.1–1.0)
NEUTROS PCT: 45 %
Neutro Abs: 1.9 10*3/uL (ref 1.7–7.7)
Platelets: 151 10*3/uL (ref 150–400)
RBC: 3.91 MIL/uL — ABNORMAL LOW (ref 4.22–5.81)
RDW: 14.5 % (ref 11.5–15.5)
WBC: 4.3 10*3/uL (ref 4.0–10.5)

## 2016-06-02 LAB — BASIC METABOLIC PANEL
Anion gap: 4 — ABNORMAL LOW (ref 5–15)
BUN: 12 mg/dL (ref 6–20)
CHLORIDE: 112 mmol/L — AB (ref 101–111)
CO2: 24 mmol/L (ref 22–32)
CREATININE: 0.97 mg/dL (ref 0.61–1.24)
Calcium: 8.5 mg/dL — ABNORMAL LOW (ref 8.9–10.3)
GFR calc Af Amer: >60 mL/min (ref >60)
GFR calc non Af Amer: >60 mL/min (ref >60)
GLUCOSE: 104 mg/dL — AB (ref 65–99)
Potassium: 4 mmol/L (ref 3.5–5.1)
SODIUM: 140 mmol/L (ref 135–145)

## 2016-06-02 NOTE — Discharge Summary (Signed)
Physician Discharge Summary  Joel Bauer RCV:[id number] DOB: 24-Nov-1933 DOA: 05/31/2016  PCP: Odette Fraction, MD  Admit date: 05/31/2016 Discharge date: 06/02/2016  Time spent: 32 minutes  Recommendations for Outpatient Follow-up:  1. Recommend outpatient follow-up with general surgery as needed 2. Recommend basic metabolic panel with magnesium in about one week 3. Soft diet on discharge 4. Resume Most of home medications at discharge 5. Advised Tylenol for mild pain  Discharge Diagnoses:  Active Problems:   History of diverticulitis   LLQ pain   Hypertension   Abdominal pain   Small bowel obstruction   Hypokalemia   Discharge Condition: Improved  Diet recommendation: Heart healthy  Filed Weights   05/31/16 0634  Weight: 77.8 kg (171 lb 8.3 oz)    History of present illness:  83 y/o  Diverticulitis s/p resection admitted 3/154 with n/v/d ED work-up showed ? SBO Admitted and gen surg conslted  Hospital Course:  Patient was monitored on telemetry unit his repeat abdominal x-ray subsequent to admission showed still mild blockage-he had some pain and discomfort of this seemed to get better. He was started on a soft diet and NG tube was held as he had a stool For his hypertension we continued his mental alone and has held off on his diuretics valsartan and this was resumed on discharge she will need a potassium level and magnesium is no  I advised him to use Tylenol for mild abdominal pain on discharge  Procedures:  None   Consultations:  General surgery  Discharge Exam: Vitals:   06/01/16 2111 06/02/16 0521  BP: 131/65 107/60  Pulse: 60 (!) 56  Resp: 18 20  Temp: 97.8 F (36.6 C) 98.2 F (36.8 C)    General: Alert pleasant oriented no apparent distress some mild discomfort overall Cardiovascular: S1-S2 no murmur rub or gallop Respiratory: Clear no added sound  Discharge Instructions   Discharge Instructions    Diet - low sodium heart healthy     Complete by:  As directed    Discharge instructions    Complete by:  As directed    Soft diet for 4-5 days Use tylenol for mild pains-Call MEd if severe pain and nausea and vomit Surgery will see you as an OP and will call you to set up time date of appt   Increase activity slowly    Complete by:  As directed      Current Discharge Medication List    CONTINUE these medications which have NOT CHANGED   Details  amLODipine (NORVASC) 10 MG tablet Take 1 tablet (10 mg total) by mouth daily. Qty: 90 tablet, Refills: 3    Cholecalciferol (VITAMIN D3) 2000 UNITS TABS Take 1 tablet by mouth daily.    Coenzyme Q10 (COQ10 PO) Take 100 mg by mouth daily.     fluticasone (FLONASE) 50 MCG/ACT nasal spray Place 1 spray into both nostrils daily as needed for allergies or rhinitis.    hydroxypropyl methylcellulose / hypromellose (ISOPTO TEARS / GONIOVISC) 2.5 % ophthalmic solution Place 1 drop into both eyes 3 (three) times daily as needed for dry eyes.    KRILL OIL PO Take 1 tablet by mouth daily.     LUTEIN PO Take 25 mg by mouth daily.     metoprolol succinate (TOPROL-XL) 25 MG 24 hr tablet Take 1 tablet (25 mg total) by mouth daily. Qty: 90 tablet, Refills: 3   Associated Diagnoses: Palpitations    omeprazole (PRILOSEC) 40 MG capsule Take 1 capsule (40  mg total) by mouth daily. Qty: 30 capsule, Refills: 0   Associated Diagnoses: Gastroesophageal reflux disease without esophagitis    OVER THE COUNTER MEDICATION Take 1 capsule by mouth daily. Cumin    ranitidine (ZANTAC) 150 MG tablet Take 1 tablet (150 mg total) by mouth 2 (two) times daily. Qty: 60 tablet, Refills: 0    saccharomyces boulardii (FLORASTOR) 250 MG capsule Take 250 mg by mouth daily.    triamterene-hydrochlorothiazide (DYAZIDE) 37.5-25 MG capsule Take 1 capsule by mouth daily.    ondansetron (ZOFRAN ODT) 4 MG disintegrating tablet Take 1 tablet (4 mg total) by mouth every 8 (eight) hours as needed for nausea or  vomiting. Qty: 30 tablet, Refills: 0   Associated Diagnoses: Abdominal pain, epigastric      STOP taking these medications     valsartan (DIOVAN) 160 MG tablet        No Known Allergies    The results of significant diagnostics from this hospitalization (including imaging, microbiology, ancillary and laboratory) are listed below for reference.    Significant Diagnostic Studies: Ct Abdomen Pelvis W Contrast  Result Date: 05/31/2016 CLINICAL DATA:  Epigastric pain and nausea for 3 days. Patient has previously been seen by primary care physician and in the ED. No relief from medications. EXAM: CT ABDOMEN AND PELVIS WITH CONTRAST TECHNIQUE: Multidetector CT imaging of the abdomen and pelvis was performed using the standard protocol following bolus administration of intravenous contrast. CONTRAST:  100 mL Isovue-300 COMPARISON:  12/29/2014 FINDINGS: Lower chest: Atelectasis in the lung bases. Coronary artery and aortic calcifications. Hepatobiliary: Mild diffuse fatty infiltration of the liver. Peripherally enhancing lesion in the left lobe of the liver measuring 1.7 cm diameter. Homogeneously enhancing lesion in the inferior right lobe of the liver measuring 12 mm diameter. These are unchanged since previous study and probably represent cavernous hemangiomas. Gallbladder and bile ducts are unremarkable. Pancreas: Unremarkable. No pancreatic ductal dilatation or surrounding inflammatory changes. Spleen: Normal in size without focal abnormality. Adrenals/Urinary Tract: No adrenal gland nodules. subcentimeter cysts on the kidneys. Renal nephrograms are symmetrical. No hydronephrosis or hydroureter. Bladder wall is not thickened. Stomach/Bowel: Stomach is decompressed. Mildly dilated gas-filled mid abdominal small bowel with small bowel wall thickening and mesenteric edema. Changes may represent partial obstruction or enteritis. Ischemia is not excluded but felt less likely. Mesenteric vessels appear  patent. No evidence of pneumatosis or portal venous gas. Distal small bowel are relatively decompressed. Scattered stool throughout the colon. No colonic distention or wall thickening. Appendix is normal. Surgical anastomosis at the mid sigmoid region. Vascular/Lymphatic: Aortic atherosclerosis. No enlarged abdominal or pelvic lymph nodes. Reproductive: Prostate gland is diffusely enlarged, measuring 5.9 cm in diameter. Other: No free air or free fluid in the abdomen. Abdominal wall musculature appears intact. Small amount of free fluid is demonstrated in the pelvis. Musculoskeletal: Degenerative changes in the spine. IMPRESSION: Mildly dilated fluid-filled small bowel with mild wall thickening and mesenteric edema. Changes suggest partial obstruction with enteritis or less likely ischemia. Small amount of free fluid in the pelvis is likely reactive. Hemangiomas in the liver. Electronically Signed   By: Lucienne Capers M.D.   On: 05/31/2016 04:14   Dg Abd 2 Views  Result Date: 06/02/2016 CLINICAL DATA:  Partial small bowel obstruction, followup exam EXAM: ABDOMEN - 2 VIEW COMPARISON:  06/01/2016 FINDINGS: Scattered large and small bowel gas is noted. No obstructive changes are seen. No free air is noted. No abnormal mass or abnormal calcifications are noted. IMPRESSION: No obstructive changes  are identified. Electronically Signed   By: Inez Catalina M.D.   On: 06/02/2016 09:05   Dg Abd 2 Views  Result Date: 06/01/2016 CLINICAL DATA:  Epigastric pain and nausea for 3 days, small bowel obstruction EXAM: ABDOMEN - 2 VIEW COMPARISON:  CT abdomen and pelvis 05/31/2016 FINDINGS: Persistent dilatation of a few mid abdominal small bowel loops. Less small bowel distention than expected from previous CT exam. No definite free intraperitoneal air. Bones demineralized. Numerous pelvic phleboliths. Visualized lung bases clear. IMPRESSION: Decreased small bowel distention versus prior CT exam though a few dilated small  bowel loops are still identified in the mid abdomen. Electronically Signed   By: Lavonia Dana M.D.   On: 06/01/2016 11:15    Microbiology: No results found for this or any previous visit (from the past 240 hour(s)).   Labs: Basic Metabolic Panel:  Recent Labs Lab 05/29/16 1736 05/31/16 0253 06/02/16 0500  NA 137 138 140  K 3.4* 3.1* 4.0  CL 100* 101 112*  CO2 29 27 24   GLUCOSE 98 116* 104*  BUN 21* 22* 12  CREATININE 1.11 1.09 0.97  CALCIUM 9.3 8.9 8.5*   Liver Function Tests:  Recent Labs Lab 05/29/16 1736 05/31/16 0253  AST 26 23  ALT 27 24  ALKPHOS 55 56  BILITOT 1.1 0.8  PROT 7.0 6.6  ALBUMIN 4.1 3.9    Recent Labs Lab 05/29/16 1736 05/31/16 0253  LIPASE 17 24   No results for input(s): AMMONIA in the last 168 hours. CBC:  Recent Labs Lab 05/29/16 0924 05/29/16 1736 05/31/16 0253 06/02/16 0500  WBC 9.4 8.5 8.8 4.3  NEUTROABS  --  6.1 6.1 1.9  HGB 15.8 14.9 13.9 11.9*  HCT 47.6 43.5 40.3 34.0*  MCV 90.3 86.1 85.7 87.0  PLT 177 166 158 151   Cardiac Enzymes: No results for input(s): CKTOTAL, CKMB, CKMBINDEX, TROPONINI in the last 168 hours. BNP: BNP (last 3 results) No results for input(s): BNP in the last 8760 hours.  ProBNP (last 3 results) No results for input(s): PROBNP in the last 8760 hours.  CBG: No results for input(s): GLUCAP in the last 168 hours.     SignedNita Sells MD   [facility] 06/02/2016, 10:52 AM

## 2016-06-02 NOTE — Progress Notes (Signed)
General Surgery [facility]  Assessment & Plan:  Partial SBO, possible enteritisversus ischemia  AXR pending this AM - will review  Soft diet tolerated  Encouraged ambulation, OOB  Possible discharge later today if diet tolerated and AXR shows continued improvement        Earnstine Regal, MD, [facility]       Office: [phone]    Subjective: Patient in bed.  "Dark" BM last night.  Denies nausea or emesis.  Abdomen "doesn't feel quite right".  Objective: Vital signs in last 24 hours: Temp:  [97.7 F (36.5 C)-98.2 F (36.8 C)] 98.2 F (36.8 C) (03/16 0521) Pulse Rate:  [56-65] 56 (03/16 0521) Resp:  [18-20] 20 (03/16 0521) BP: (107-131)/(47-65) 107/60 (03/16 0521) SpO2:  [94 %-99 %] 94 % (03/16 0521) Last BM Date: 06/01/16  Intake/Output from previous day: 03/15 0701 - 03/16 0700 In: 2575 [P.O.:600; I.V.:1875; IV Piggyback:100] Out: -  Intake/Output this shift: No intake/output data recorded.  Physical Exam: HEENT - sclerae clear, mucous membranes moist Abdomen - soft, mild distension; active BS present; non-tender Ext - no edema, non-tender Neuro - alert & oriented, no focal deficits  Lab Results:   Recent Labs  05/31/16 0253 06/02/16 0500  WBC 8.8 4.3  HGB 13.9 11.9*  HCT 40.3 34.0*  PLT 158 151   BMET  Recent Labs  05/31/16 0253 06/02/16 0500  NA 138 140  K 3.1* 4.0  CL 101 112*  CO2 27 24  GLUCOSE 116* 104*  BUN 22* 12  CREATININE 1.09 0.97  CALCIUM 8.9 8.5*   PT/INR No results for input(s): LABPROT, INR in the last 72 hours. Comprehensive Metabolic Panel:    Component Value Date/Time   NA 140 06/02/2016 0500   NA 138 05/31/2016 0253   K 4.0 06/02/2016 0500   K 3.1 (L) 05/31/2016 0253   CL 112 (H) 06/02/2016 0500   CL 101 05/31/2016 0253   CO2 24 06/02/2016 0500   CO2 27 05/31/2016 0253   BUN 12 06/02/2016 0500   BUN 22 (H) 05/31/2016 0253   CREATININE 0.97 06/02/2016 0500    CREATININE 1.09 05/31/2016 0253   CREATININE 1.33 (H) 04/19/2016 1206   CREATININE 1.10 12/07/2015 1508   GLUCOSE 104 (H) 06/02/2016 0500   GLUCOSE 116 (H) 05/31/2016 0253   CALCIUM 8.5 (L) 06/02/2016 0500   CALCIUM 8.9 05/31/2016 0253   AST 23 05/31/2016 0253   AST 26 05/29/2016 1736   ALT 24 05/31/2016 0253   ALT 27 05/29/2016 1736   ALKPHOS 56 05/31/2016 0253   ALKPHOS 55 05/29/2016 1736   BILITOT 0.8 05/31/2016 0253   BILITOT 1.1 05/29/2016 1736   PROT 6.6 05/31/2016 0253   PROT 7.0 05/29/2016 1736   ALBUMIN 3.9 05/31/2016 0253   ALBUMIN 4.1 05/29/2016 1736    Studies/Results: Dg Abd 2 Views  Result Date: 06/01/2016 CLINICAL DATA:  Epigastric pain and nausea for 3 days, small bowel obstruction EXAM: ABDOMEN - 2 VIEW COMPARISON:  CT abdomen and pelvis 05/31/2016 FINDINGS: Persistent dilatation of a few mid abdominal small bowel loops. Less small bowel distention than expected from previous CT exam. No definite free intraperitoneal air. Bones demineralized. Numerous pelvic phleboliths. Visualized lung bases clear. IMPRESSION: Decreased small bowel distention versus prior CT exam though a few dilated small bowel loops are still identified in the mid abdomen. Electronically Signed   By: Lavonia Dana M.D.   On:  06/01/2016 11:15      Quintasia Theroux Jerilynn Mages 06/02/2016

## 2016-06-02 NOTE — Progress Notes (Signed)
Discharge instructions discussed with patient, verbalized agreement and understanding, 

## 2016-06-09 ENCOUNTER — Ambulatory Visit: Payer: Medicare Other | Admitting: Cardiology

## 2016-06-15 ENCOUNTER — Encounter: Payer: Self-pay | Admitting: Family Medicine

## 2016-06-15 ENCOUNTER — Ambulatory Visit (INDEPENDENT_AMBULATORY_CARE_PROVIDER_SITE_OTHER): Payer: Medicare Other | Admitting: Family Medicine

## 2016-06-15 VITALS — BP 140/70 | HR 80 | Temp 97.8°F | Resp 14 | Ht 71.0 in | Wt 171.0 lb

## 2016-06-15 DIAGNOSIS — Z09 Encounter for follow-up examination after completed treatment for conditions other than malignant neoplasm: Secondary | ICD-10-CM

## 2016-06-15 NOTE — Progress Notes (Signed)
Subjective:    Patient ID: Joel Bauer, male    DOB: [birth year], 83 y.o.   MRN: [id number]  HPI  05/29/16 Patient has a significant past medical history of diverticulitis status post partial colectomy performed last year for recurrent diverticulitis. Beginning Saturday, the patient developed sudden intense epigastric abdominal pain. He describes the pain as a constant 5 out of 10 but occasionally more severe. It is located inferior to the xiphoid process. There are no exacerbating or alleviating factors. He denies any constipation. He does report nausea. He denies any vomiting. He denies any change in bowel habit. He denies any blood in his stool. He denies any melena. He denies any hematochezia. He denies any radiation of the abdominal pain. Patient had a CAT scan of the abdomen and pelvis performed in October 2016 which revealed:  1. Uncomplicated sigmoid diverticulitis. 2.  Atherosclerosis, including within the coronary arteries. 3. Stable appearance of the liver. Left hepatic lobe hemangioma. Right hepatic lobe hemangioma or perfusion anomaly. 4. Prostatomegaly. 5. Left renal cyst and bilateral too small to characterize renal lesions. A tiny interpolar left renal lesion demonstrates apparent complexity, but given presence back to 09/12/2008, is favored to represent a complex cyst. 6. Mild hepatic steatosis.  It was no evidence of an abdominal aortic aneurysm. There was no mention of gallstones although CT scan is not an ideal test to evaluate for this.  Diagnosis includes pancreatitis, peptic ulcer disease, biliary colic, small bowel obstruction although I believe this is unlikely given the fact he still play passing stool and gas, gastroenteritis.  At that time, my plan was: See history of present illness for a differential diagnosis. I will try empiric treatment with 25 mg of Phenergan IM and a stat CBC. Reassess the patient in approximately 15-30 minutes. If his stomach  is improving, and his CBC shows no leukocytosis, will treat the patient empirically for possible viral gastroenteritis. If the abdominal pain is intensifying or there is an elevated white blood cell count, we will need to get imaging of the abdomen to evaluate for possible biliary colic versus pancreatitis  WBC=9 reassuring After 30 minutes, the patient is starting to feel better. I suspect that he has bilateral gastroenteritis. We'll treat the patient was Zofran 4 mg every 8 hours as needed and a bland diet. Recheck in 24 hours. Schedule right upper quadrant ultrasound pain persist  06/15/16 Ultimately, the patient had to go the emergency room where he was diagnosed with a partial small bowel obstruction. With tincture of time and IV fluids, the bowel obstruction resolved spontaneously. He is here today for follow-up. He denies any further abdominal pain. He denies any nausea or vomiting. He denies any hematemesis. He denies any blood in his stool or melena. His blood pressure has been well controlled at 130/76 discharge from the hospital. He is not taking Diovan but is taking Maxide. Overall he feels much better with no concerns   Past Medical History:  Diagnosis Date  . Adenomatous colon polyp 2001  . Anemia    as a child   . Aortic regurgitation    a. mild by echo 03/2015  . Arthritis   . Cancer (HCC)    hx of skin cancer   . Diverticulitis of colon - recurrent/persistent 12/29/2014  . Diverticulosis of colon (without mention of hemorrhage) 2005  . GERD (gastroesophageal reflux disease)   . Heart murmur   . History of kidney stones   . History  of skin cancer   . Hx of Clostridium difficile infection   . Hyperglycemia   . Hyperlipidemia   . Hypertension   . Hypertensive heart disease   . Macular degeneration    left eye  . Orthostasis   . Pleurisy   . Prostatitis   . 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 Retinal hemorrhage   . Shingles    Past Surgical History:    Procedure Laterality Date  . CATARACTS REMOVED    . COLONOSCOPY    . COLONOSCOPY N/A 06/15/2014   Procedure: COLONOSCOPY;  Surgeon: Gatha Mayer, MD;  Location: [facility];  Service: Endoscopy;  Laterality: N/A;  . CYSTOSCOPY WITH RETROGRADE PYELOGRAM, URETEROSCOPY AND STENT PLACEMENT Right 10/18/2013   Procedure: CYSTOSCOPY WITH RIGHT RETROGRADE/RIGHT URETEROSCOPY, STONE BASKETRY,  AND STENT PLACEMENT RIGHT;  Surgeon: Alexis Frock, MD;  Location: [facility];  Service: Urology;  Laterality: Right;  . EYE SURGERY     3 injections to left eye for macular degeneration   . FMT  06/2014  . LAPAROSCOPIC LOW ANTERIOR RESECTION N/A 04/19/2015   Procedure: LAPAROSCOPIC ASSISTED  LOW ANTERIOR RESECTION, splenic flexure mobilization;  Surgeon: Fanny Skates, MD;  Location: [facility];  Service: General;  Laterality: N/A;  . LITHOTRIPSY    . TONSILLECTOMY     Current Outpatient Prescriptions on File Prior to Visit  Medication Sig Dispense Refill  . amLODipine (NORVASC) 10 MG tablet Take 1 tablet (10 mg total) by mouth daily. 90 tablet 3  . Cholecalciferol (VITAMIN D3) 2000 UNITS TABS Take 1 tablet by mouth daily.    . Coenzyme Q10 (COQ10 PO) Take 100 mg by mouth daily.     . fluticasone (FLONASE) 50 MCG/ACT nasal spray Place 1 spray into both nostrils daily as needed for allergies or rhinitis.    . hydroxypropyl methylcellulose / hypromellose (ISOPTO TEARS / GONIOVISC) 2.5 % ophthalmic solution Place 1 drop into both eyes 3 (three) times daily as needed for dry eyes.    Marland Kitchen KRILL OIL PO Take 1 tablet by mouth daily.     . LUTEIN PO Take 25 mg by mouth daily.     . metoprolol succinate (TOPROL-XL) 25 MG 24 hr tablet Take 1 tablet (25 mg total) by mouth daily. 90 tablet 3  . omeprazole (PRILOSEC) 40 MG capsule Take 1 capsule (40 mg total) by mouth daily. 30 capsule 0  . ondansetron (ZOFRAN ODT) 4 MG disintegrating tablet Take 1 tablet (4 mg total) by mouth every 8 (eight) hours as needed for nausea or  vomiting. 30 tablet 0  . OVER THE COUNTER MEDICATION Take 1 capsule by mouth daily. Cumin    . ranitidine (ZANTAC) 150 MG tablet Take 1 tablet (150 mg total) by mouth 2 (two) times daily. 60 tablet 0  . saccharomyces boulardii (FLORASTOR) 250 MG capsule Take 250 mg by mouth daily.    Marland Kitchen triamterene-hydrochlorothiazide (DYAZIDE) 37.5-25 MG capsule Take 1 capsule by mouth daily.     No current facility-administered medications on file prior to visit.    No Known Allergies Social History   Social History  . Marital status: Widowed    Spouse name: N/A  . Number of children: 1  . Years of education: N/A   Occupational History  . Retired     Social History Main Topics  . Smoking status: Never Smoker  . Smokeless tobacco: Never Used  . Alcohol use No  . Drug use: No  . Sexual activity: Not on  file   Other Topics Concern  . Not on file   Social History Narrative  . No narrative on file      Review of Systems  All other systems reviewed and are negative.      Objective:   Physical Exam  Constitutional: He appears well-developed and well-nourished. No distress.  Cardiovascular: Normal rate, regular rhythm, normal heart sounds and intact distal pulses.   No murmur heard. Pulmonary/Chest: Effort normal and breath sounds normal. No respiratory distress. He has no wheezes. He has no rales.  Abdominal: Soft. Bowel sounds are normal. He exhibits no distension and no mass. There is no tenderness. There is no rebound and no guarding.  Vitals reviewed.         Assessment & Plan:  Hospital discharge follow-up - Plan: COMPLETE METABOLIC PANEL WITH GFR Repeat a CMP to monitor his potassium given his history of hypokalemia. Partial small bowel obstruction has resolved. Most likely due to adhesions. No further workup is necessary as long as symptoms do not return. Patient gets recurrent small bowel obstructions, may need general surgery consultation to evaluate for lysis of  adhesions.

## 2016-06-16 LAB — COMPLETE METABOLIC PANEL WITH GFR
ALBUMIN: 3.9 g/dL (ref 3.6–5.1)
ALK PHOS: 58 U/L (ref 40–115)
ALT: 20 U/L (ref 9–46)
AST: 17 U/L (ref 10–35)
BILIRUBIN TOTAL: 0.6 mg/dL (ref 0.2–1.2)
BUN: 21 mg/dL (ref 7–25)
CO2: 27 mmol/L (ref 20–31)
CREATININE: 1.19 mg/dL — AB (ref 0.70–1.11)
Calcium: 8.9 mg/dL (ref 8.6–10.3)
Chloride: 102 mmol/L (ref 98–110)
GFR, Est African American: 66 mL/min (ref >=60)
GFR, Est Non African American: 57 mL/min — ABNORMAL LOW (ref >=60)
GLUCOSE: 124 mg/dL — AB (ref 70–99)
Potassium: 3.4 mmol/L — ABNORMAL LOW (ref 3.5–5.3)
SODIUM: 141 mmol/L (ref 135–146)
TOTAL PROTEIN: 6.3 g/dL (ref 6.1–8.1)

## 2016-06-20 ENCOUNTER — Other Ambulatory Visit: Payer: Self-pay | Admitting: Family Medicine

## 2016-06-20 ENCOUNTER — Telehealth: Payer: Self-pay | Admitting: Family Medicine

## 2016-06-20 MED ORDER — LOSARTAN POTASSIUM 100 MG PO TABS: 100.0000 mg | ORAL_TABLET | Freq: Every day | ORAL | 0 refills | Status: DC

## 2016-06-20 NOTE — Telephone Encounter (Signed)
Left pt to return call about recent lab results.  Med changes have been made and sent to pharmacy.

## 2016-06-21 ENCOUNTER — Ambulatory Visit (INDEPENDENT_AMBULATORY_CARE_PROVIDER_SITE_OTHER): Payer: Medicare Other | Admitting: Cardiology

## 2016-06-21 ENCOUNTER — Encounter: Payer: Self-pay | Admitting: Cardiology

## 2016-06-21 VITALS — BP 124/64 | HR 67 | Ht 71.0 in | Wt 171.0 lb

## 2016-06-21 DIAGNOSIS — I951 Orthostatic hypotension: Secondary | ICD-10-CM

## 2016-06-21 DIAGNOSIS — I119 Hypertensive heart disease without heart failure: Secondary | ICD-10-CM

## 2016-06-21 DIAGNOSIS — R072 Precordial pain: Secondary | ICD-10-CM

## 2016-06-21 DIAGNOSIS — R002 Palpitations: Secondary | ICD-10-CM | POA: Diagnosis not present

## 2016-06-21 DIAGNOSIS — E876 Hypokalemia: Secondary | ICD-10-CM | POA: Diagnosis not present

## 2016-06-21 MED ORDER — AMLODIPINE BESYLATE 5 MG PO TABS: 5.0000 mg | ORAL_TABLET | Freq: Every day | ORAL | 11 refills | Status: DC

## 2016-06-21 MED ORDER — LOSARTAN POTASSIUM 50 MG PO TABS: 50.0000 mg | ORAL_TABLET | Freq: Every day | ORAL | 11 refills | Status: DC

## 2016-06-21 MED ORDER — TRIAMTERENE-HCTZ 37.5-25 MG PO CAPS: 1.0000 | ORAL_CAPSULE | Freq: Every day | ORAL | 3 refills | Status: DC

## 2016-06-21 NOTE — Telephone Encounter (Signed)
Able to reach pt today.  He had just returned from seeing Cardiologist.  The Cardiologist has adjusted medications.  Told him to follow those instructions and I will let PCP know.

## 2016-06-21 NOTE — Patient Instructions (Addendum)
Medication Instructions:  START Losartan 50 mg once daily - do not start Losartan 100 mg DECREASE Amlodipine to 5 mg once daily CONTINUE Dyazide 37.5/25 mg  Labwork: Your physician recommends that you return for lab work in: 4 weeks for basic metabolic panel (on same day as appointment with Dr. Meda Coffee)   Testing/Procedures: None Ordered   Follow-Up: Your physician recommends that you schedule a follow-up appointment in: 4 weeks with Dr. Meda Coffee   If you need a refill on your cardiac medications before your next appointment, please call your pharmacy.   Thank you for choosing [facility]! Christen Bame, RN [phone]

## 2016-06-21 NOTE — Progress Notes (Signed)
Cardiology Office Note    Date:  06/21/2016   ID:  Joel Bauer, DOB 1933/10/22, MRN [id number]  PCP:  Joel Fraction, MD  Cardiologist: Dr. Meda Coffee  Chief complain: 6 months follow up  History of Present Illness:  Joel Bauer is a 83 y.o. male  with history of HTN, hypertensive heart disease, orthostasis following weight loss after laparoscopic low anterior resection for chronic diverticulitis, hyperlipidemia,arthritis, retinal hemorrhage, macular degeneration who presents for f/u. Saw Dr. Meda Coffee in the past for atypical chest pain with nuc 03/2015 that was normal except for hypertensive response to exercise. 2D Echo 03/2015 showed EF 60-65%, mild LVH, grade 1 DD with normal filling pressure, aortic sclerosis without steonsis, mild AI. He had palpitations treated with metoprolol and losartan was stopped.  06/21/2016, patient was hospitalized recently for another episode of diverticulitis that didn't require surgery but he has lost at least 5 pounds of weight. He states that he has been feeling okay he denies any chest pain shortness of breath, he has occasional lower extremity edema no orthopnea or proximal nocturnal dyspnea. He feels skipped beats once or twice a day but no sustained tachycardias dizziness or syncope.  Past Medical History:  Diagnosis Date  . Adenomatous colon polyp 2001  . Anemia    as a child   . Aortic regurgitation    a. mild by echo 03/2015  . Arthritis   . Cancer (HCC)    hx of skin cancer   . Diverticulitis of colon - recurrent/persistent 12/29/2014  . Diverticulosis of colon (without mention of hemorrhage) 2005  . GERD (gastroesophageal reflux disease)   . Heart murmur   . History of kidney stones   . History of skin cancer   . Hx of Clostridium difficile infection   . Hyperglycemia   . Hyperlipidemia   . Hypertension   . Hypertensive heart disease   . Macular degeneration    left eye  . Orthostasis   . Pleurisy   . Prostatitis   .  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 Retinal hemorrhage   . Shingles     Past Surgical History:  Procedure Laterality Date  . CATARACTS REMOVED    . COLONOSCOPY    . COLONOSCOPY N/A 06/15/2014   Procedure: COLONOSCOPY;  Surgeon: Gatha Mayer, MD;  Location: [facility];  Service: Endoscopy;  Laterality: N/A;  . CYSTOSCOPY WITH RETROGRADE PYELOGRAM, URETEROSCOPY AND STENT PLACEMENT Right 10/18/2013   Procedure: CYSTOSCOPY WITH RIGHT RETROGRADE/RIGHT URETEROSCOPY, STONE BASKETRY,  AND STENT PLACEMENT RIGHT;  Surgeon: Alexis Frock, MD;  Location: [facility];  Service: Urology;  Laterality: Right;  . EYE SURGERY     3 injections to left eye for macular degeneration   . FMT  06/2014  . LAPAROSCOPIC LOW ANTERIOR RESECTION N/A 04/19/2015   Procedure: LAPAROSCOPIC ASSISTED  LOW ANTERIOR RESECTION, splenic flexure mobilization;  Surgeon: Fanny Skates, MD;  Location: [facility];  Service: General;  Laterality: N/A;  . LITHOTRIPSY    . TONSILLECTOMY      Current Medications: Outpatient Medications Prior to Visit  Medication Sig Dispense Refill  . Cholecalciferol (VITAMIN D3) 2000 UNITS TABS Take 1 tablet by mouth daily.    . Coenzyme Q10 (COQ10 PO) Take 100 mg by mouth daily.     . fluticasone (FLONASE) 50 MCG/ACT nasal spray Place 1 spray into both nostrils daily as needed for allergies or rhinitis.    . hydroxypropyl methylcellulose / hypromellose (ISOPTO TEARS / GONIOVISC) 2.5 %  ophthalmic solution Place 1 drop into both eyes 3 (three) times daily as needed for dry eyes.    Marland Kitchen KRILL OIL PO Take 1 tablet by mouth daily.     . LUTEIN PO Take 25 mg by mouth daily.     . metoprolol succinate (TOPROL-XL) 25 MG 24 hr tablet Take 1 tablet (25 mg total) by mouth daily. 90 tablet 3  . omeprazole (PRILOSEC) 40 MG capsule Take 1 capsule (40 mg total) by mouth daily. 30 capsule 0  . OVER THE COUNTER MEDICATION Take 1 capsule by mouth daily. Cumin    . ranitidine (ZANTAC) 150 MG tablet Take 1  tablet (150 mg total) by mouth 2 (two) times daily. 60 tablet 0  . saccharomyces boulardii (FLORASTOR) 250 MG capsule Take 250 mg by mouth daily.    Marland Kitchen amLODipine (NORVASC) 10 MG tablet Take 1 tablet (10 mg total) by mouth daily. 90 tablet 3  . ondansetron (ZOFRAN ODT) 4 MG disintegrating tablet Take 1 tablet (4 mg total) by mouth every 8 (eight) hours as needed for nausea or vomiting. (Patient not taking: Reported on 06/21/2016) 30 tablet 0  . losartan (COZAAR) 100 MG tablet Take 1 tablet (100 mg total) by mouth daily. (Patient not taking: Reported on 06/21/2016) 90 tablet 0   No facility-administered medications prior to visit.      Allergies:   Patient has no known allergies.   Social History   Social History  . Marital status: Widowed    Spouse name: N/A  . Number of children: 1  . Years of education: N/A   Occupational History  . Retired     Social History Main Topics  . Smoking status: Never Smoker  . Smokeless tobacco: Never Used  . Alcohol use No  . Drug use: No  . Sexual activity: Not Asked   Other Topics Concern  . None   Social History Narrative  . None     Family History:  The patient's family history includes Breast cancer in his mother; Colon cancer in his father; Colon polyps in his father; Heart disease in his mother; Kidney disease in his sister.   ROS:   Please see the history of present illness.    ROS All other systems reviewed and are negative.   PHYSICAL EXAM:   VS:  BP 124/64   Pulse 67   Ht 5\' 11"  (1.803 m)   Wt 171 lb (77.6 kg)   BMI 23.85 kg/m   Physical Exam  GEN: Well nourished, well developed, in no acute distress  HEENT: normal  Neck: no JVD, carotid bruits, or masses Cardiac:RRR; no murmurs, rubs, or gallops  Respiratory:  clear to auscultation bilaterally, normal work of breathing GI: soft, nontender, nondistended, + BS Ext: without cyanosis, clubbing, or edema, Good distal pulses bilaterally MS: no deformity or atrophy  Skin: warm  and dry, no rash Neuro:  Alert and Oriented x 3, Strength and sensation are intact Psych: euthymic mood, full affect  Wt Readings from Last 3 Encounters:  06/21/16 171 lb (77.6 kg)  06/15/16 171 lb (77.6 kg)  05/31/16 171 lb 8.3 oz (77.8 kg)      Studies/Labs Reviewed:   EKG:  EKG is ordered today.  The ekg ordered today demonstrates Normal sinus rhythm normal EKG. Personally reviewed.  Recent Labs: 12/07/2015: Magnesium 2.1; TSH 0.97 06/02/2016: Hemoglobin 11.9; Platelets 151 06/15/2016: ALT 20; BUN 21; Creat 1.19; Potassium 3.4; Sodium 141   Lipid Panel No results found for:  CHOL, TRIG, HDL, CHOLHDL, VLDL, LDLCALC, LDLDIRECT  Additional studies/ records that were reviewed today include:  Exercise nuclear stress test: 04/12/2015  Nuclear stress EF: 61%.  Blood pressure demonstrated a hypertensive response to exercise.  There was no ST segment deviation noted during stress.  The study is normal.  This is a low risk study.  The left ventricular ejection Bauer is normal (55-65%).   TTE: 04/12/2015 - Left ventricle: The cavity size was normal. Wall thickness was   increased in a pattern of mild LVH. Systolic function was normal.   The estimated ejection Bauer was in the range of 60% to 65%.   Wall motion was normal; there were no regional wall motion   abnormalities. Doppler parameters are consistent with abnormal   left ventricular relaxation (grade 1 diastolic dysfunction). The   E/e&' ratio is <8, suggesting normal LV filling pressure. - Aortic valve: Trileaflet. Sclerosis without stenosis. There was   mild regurgitation. - Mitral valve: Mildly thickened leaflets . There was trivial   regurgitation. - Left atrium: The atrium was normal in size. - Tricuspid valve: There was trivial regurgitation. - Pulmonary arteries: PA peak pressure: 25 mm Hg (S). - Systemic veins: The IVC Was not visualized.   Impressions: - LVEF 65-70%, mild LVH, normal wall motion,  diastolic dysfunction   with normal LV filling pressure, aortic sclerosis with mild AI         ASSESSMENT:    1. Hypertensive heart disease without heart failure   2. Orthostasis   3. Precordial pain   4. Palpitations   5. Hypokalemia      PLAN:  In order of problems listed above:  1.  Chest pain - atypical, with risk factors - including age, sex, HTN, HLP, negative stress test in January 2017, chest pain has resolved. Stress test showed hypertensive response to stress.  2. Hypertensive heart disease without heart failure, with hypertensive response to stress, and LVH on echocardiogram LVEF 65-70%. He was recently diagnosed with hypokalemia and his physician wanted to discontinue triamterene/hydrochlorothiazide however the patient states that that medication has been helping him with Mnire's disease. We will restart Maxzide, we will decrease amlodipine to 5 mg daily as he has mild lower extremity edema occasionally, and start losartan 50 mg daily. He'll follow up in 4 weeks with BMP.  3. hypokalemia, we will recheck at the next visit. He's advised to eat foods rich in potassium.   Medication Adjustments/Labs and Tests Ordered: Current medicines are reviewed at length with the patient today.  Concerns regarding medicines are outlined above.  Medication changes, Labs and Tests ordered today are listed in the Patient Instructions below. Patient Instructions  Medication Instructions:  START Losartan 50 mg once daily - do not start Losartan 100 mg DECREASE Amlodipine to 5 mg once daily CONTINUE Dyazide 37.5/25 mg  Labwork: Your physician recommends that you return for lab work in: 4 weeks for basic metabolic panel (on same day as appointment with Dr. Meda Coffee)   Testing/Procedures: None Ordered   Follow-Up: Your physician recommends that you schedule a follow-up appointment in: 4 weeks with Dr. Meda Coffee   If you need a refill on your cardiac medications before your next  appointment, please call your pharmacy.   Thank you for choosing [facility]! Christen Bame, RN [phone]       Signed, Joel Dawley, MD  06/21/2016 9:50 AM    [address], [place], [place]  [address] Phone: [phone];  Fax: [phone]

## 2016-06-23 ENCOUNTER — Telehealth: Payer: Self-pay | Admitting: Cardiovascular Disease

## 2016-06-23 ENCOUNTER — Telehealth: Payer: Self-pay | Admitting: Cardiology

## 2016-06-23 NOTE — Telephone Encounter (Signed)
He can hold losartan, we will recheck his BP at the next visit.

## 2016-06-23 NOTE — Telephone Encounter (Signed)
Called patient to discuss Dr. Francesca Oman advice. He states he just rechecked BP and it was 97/50. I advised him to hold Losartan and monitor BP for the next few days. He verbalized understanding and agreement and thanked me for the call.

## 2016-06-23 NOTE — Telephone Encounter (Signed)
Left message for patient to call back  

## 2016-06-23 NOTE — Telephone Encounter (Signed)
Spoke with patient who complains of dizziness since starting losartan 50 mg. He asked me to review his AVS with him and asked if he is supposed to continue Toprol. I advised that Dr. Meda Coffee did not recommend any changes with Toprol.  He advised it was prescribed some time ago by his PCP for palpitations. He states he feels like he is having more palpitations also since starting losartan. I advised him to continue to monitor BP, stay well hydrated, and that he could take the losartan, amlodipine or Toprol later in the day if BP is low in the morning and higher in the afternoon. I advised if dizziness persists, that he can try taking 1/2 losartan for a few days to see if this helps. I advised him also to monitor palpitations and to notify us if these worsen. I advised he call back next week to let us know how he is doing. He verbalized understanding and agreement with plan and thanked me for the call.

## 2016-06-23 NOTE — Telephone Encounter (Signed)
Pt c/o BP issue: STAT if pt c/o blurred vision, one-sided weakness or slurred speech  1. What are your last 5 BP readings? 110/57 2. Are you having any other symptoms (ex. Dizziness, headache, blurred vision, passed out)? Dizzy   3. What is your BP issue? Dizzy when he stands

## 2016-07-17 ENCOUNTER — Other Ambulatory Visit: Payer: Medicare Other | Admitting: *Deleted

## 2016-07-17 ENCOUNTER — Encounter: Payer: Self-pay | Admitting: Physician Assistant

## 2016-07-17 ENCOUNTER — Ambulatory Visit (INDEPENDENT_AMBULATORY_CARE_PROVIDER_SITE_OTHER): Payer: Medicare Other | Admitting: Physician Assistant

## 2016-07-17 VITALS — BP 146/74 | HR 71 | Ht 71.0 in | Wt 173.8 lb

## 2016-07-17 DIAGNOSIS — I119 Hypertensive heart disease without heart failure: Secondary | ICD-10-CM

## 2016-07-17 DIAGNOSIS — R002 Palpitations: Secondary | ICD-10-CM

## 2016-07-17 DIAGNOSIS — I351 Nonrheumatic aortic (valve) insufficiency: Secondary | ICD-10-CM

## 2016-07-17 DIAGNOSIS — I951 Orthostatic hypotension: Secondary | ICD-10-CM

## 2016-07-17 DIAGNOSIS — E876 Hypokalemia: Secondary | ICD-10-CM

## 2016-07-17 LAB — BASIC METABOLIC PANEL
BUN/Creatinine Ratio: 22 (ref 10–24)
BUN: 22 mg/dL (ref 8–27)
CO2: 24 mmol/L (ref 18–29)
Calcium: 9.2 mg/dL (ref 8.6–10.2)
Chloride: 97 mmol/L (ref 96–106)
Creatinine, Ser: 0.98 mg/dL (ref 0.76–1.27)
GFR calc Af Amer: 84 mL/min/{1.73_m2} (ref >59)
GFR calc non Af Amer: 73 mL/min/{1.73_m2} (ref >59)
Glucose: 96 mg/dL (ref 65–99)
Potassium: 3.7 mmol/L (ref 3.5–5.2)
Sodium: 140 mmol/L (ref 134–144)

## 2016-07-17 NOTE — Patient Instructions (Signed)
Medication Instructions:  None Ordered   Labwork: Your physician recommends that you return for lab work today for BMET  Testing/Procedures: None Ordered   Follow-Up: Your physician recommends that you schedule a follow-up appointment in: 2 months with Dr. Meda Coffee  Any Other Special Instructions Will Be Listed Below (If Applicable).     If you need a refill on your cardiac medications before your next appointment, please call your pharmacy.

## 2016-07-17 NOTE — Progress Notes (Signed)
Cardiology Office Note    Date:  07/17/2016   ID:  Joel Bauer, DOB 20-Mar-1934, MRN [id number]  PCP:  Odette Fraction, MD  Cardiologist: Dr. Meda Coffee  No chief complaint on file.   History of Present Illness:  Joel Bauer is a 83 y.o. male with history of HTN, hypertensive heart disease, orthostasis following weight loss after laparoscopic low anterior resection for chronic diverticulitis, hyperlipidemia,arthritis, retinal hemorrhage, macular degeneration.Saw Dr. Meda Coffee in the past for atypical chest pain with nuc 03/2015 that was normal except for hypertensive response to exercise. 2D Echo 03/2015 showed EF 60-65%, mild LVH, grade 1 DD with normal filling pressure, aortic sclerosis without steonsis, mild AI. He had palpitations treated with metoprolol and losartan was stopped.  Last saw Dr. Meda Coffee 06/21/16. He was found to be hypokalemic and PCP wanted to stop Maxide but patient wanted to continue for Mnire's disease. She restarted Maxide, decreased amlodipine and start losartan 50 mg daily. Patient called in with dizziness and low blood pressures and losartan was cut in half. He continued to have low blood pressure so losartan was stopped. He is here today for follow-up.  Patient is feeling better off the losartan. He says his blood pressures have been around 130/80. He had one day where was higher but overall it's been stable. However 6 days ago he says his heart was skipping but never got faster than 80 or 90 bpm. No associated chest pain, dizziness or presyncope. No edema. Overall he is feeling better.    Past Medical History:  Diagnosis Date  . Adenomatous colon polyp 2001  . Anemia    as a child   . Aortic regurgitation    a. mild by echo 03/2015  . Arthritis   . Cancer (HCC)    hx of skin cancer   . Diverticulitis of colon - recurrent/persistent 12/29/2014  . Diverticulosis of colon (without mention of hemorrhage) 2005  . GERD (gastroesophageal reflux disease)   .  Heart murmur   . History of kidney stones   . History of skin cancer   . Hx of Clostridium difficile infection   . Hyperglycemia   . Hyperlipidemia   . Hypertension   . Hypertensive heart disease   . Macular degeneration    left eye  . Orthostasis   . Pleurisy   . Prostatitis   . 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 Retinal hemorrhage   . Shingles     Past Surgical History:  Procedure Laterality Date  . CATARACTS REMOVED    . COLONOSCOPY    . COLONOSCOPY N/A 06/15/2014   Procedure: COLONOSCOPY;  Surgeon: Gatha Mayer, MD;  Location: [facility];  Service: Endoscopy;  Laterality: N/A;  . CYSTOSCOPY WITH RETROGRADE PYELOGRAM, URETEROSCOPY AND STENT PLACEMENT Right 10/18/2013   Procedure: CYSTOSCOPY WITH RIGHT RETROGRADE/RIGHT URETEROSCOPY, STONE BASKETRY,  AND STENT PLACEMENT RIGHT;  Surgeon: Alexis Frock, MD;  Location: [facility];  Service: Urology;  Laterality: Right;  . EYE SURGERY     3 injections to left eye for macular degeneration   . FMT  06/2014  . LAPAROSCOPIC LOW ANTERIOR RESECTION N/A 04/19/2015   Procedure: LAPAROSCOPIC ASSISTED  LOW ANTERIOR RESECTION, splenic flexure mobilization;  Surgeon: Fanny Skates, MD;  Location: [facility];  Service: General;  Laterality: N/A;  . LITHOTRIPSY    . TONSILLECTOMY      Current Medications: Outpatient Medications Prior to Visit  Medication Sig Dispense Refill  . amLODipine (NORVASC) 5 MG tablet Take  1 tablet (5 mg total) by mouth daily. 30 tablet 11  . Cholecalciferol (VITAMIN D3) 2000 UNITS TABS Take 1 tablet by mouth daily.    . Coenzyme Q10 (COQ10 PO) Take 100 mg by mouth daily.     . fluticasone (FLONASE) 50 MCG/ACT nasal spray Place 1 spray into both nostrils daily as needed for allergies or rhinitis.    . hydroxypropyl methylcellulose / hypromellose (ISOPTO TEARS / GONIOVISC) 2.5 % ophthalmic solution Place 1 drop into both eyes 3 (three) times daily as needed for dry eyes.    Marland Kitchen KRILL OIL PO Take 1 tablet by  mouth daily.     . LUTEIN PO Take 25 mg by mouth daily.     . metoprolol succinate (TOPROL-XL) 25 MG 24 hr tablet Take 1 tablet (25 mg total) by mouth daily. 90 tablet 3  . omeprazole (PRILOSEC) 40 MG capsule Take 1 capsule (40 mg total) by mouth daily. 30 capsule 0  . ondansetron (ZOFRAN ODT) 4 MG disintegrating tablet Take 1 tablet (4 mg total) by mouth every 8 (eight) hours as needed for nausea or vomiting. 30 tablet 0  . OVER THE COUNTER MEDICATION Take 1 capsule by mouth daily. Cumin    . saccharomyces boulardii (FLORASTOR) 250 MG capsule Take 250 mg by mouth daily.    Marland Kitchen triamterene-hydrochlorothiazide (DYAZIDE) 37.5-25 MG capsule Take 1 each (1 capsule total) by mouth daily. 90 capsule 3  . losartan (COZAAR) 50 MG tablet Take 1 tablet (50 mg total) by mouth daily. (Patient not taking: Reported on 07/17/2016) 30 tablet 11  . ranitidine (ZANTAC) 150 MG tablet Take 1 tablet (150 mg total) by mouth 2 (two) times daily. (Patient not taking: Reported on 07/17/2016) 60 tablet 0   No facility-administered medications prior to visit.      Allergies:   Patient has no known allergies.   Social History   Social History  . Marital status: Widowed    Spouse name: N/A  . Number of children: 1  . Years of education: N/A   Occupational History  . Retired     Social History Main Topics  . Smoking status: Never Smoker  . Smokeless tobacco: Never Used  . Alcohol use No  . Drug use: No  . Sexual activity: Not Asked   Other Topics Concern  . None   Social History Narrative  . None     Family History:  The patient's   family history includes Breast cancer in his mother; Colon cancer in his father; Colon polyps in his father; Heart disease in his mother; Kidney disease in his sister.   ROS:   Please see the history of present illness.    Review of Systems  Constitution: Negative.  HENT: Negative.   Cardiovascular: Positive for palpitations.  Respiratory: Negative.   Endocrine: Negative.    Hematologic/Lymphatic: Negative.   Skin:       Several lesions removed by dermatologist  Musculoskeletal: Negative.   Gastrointestinal: Negative.   Genitourinary: Negative.   Neurological: Negative.    All other systems reviewed and are negative.   PHYSICAL EXAM:   VS:  BP (!) 146/74 (BP Location: Right Arm)   Pulse 71   Ht 5\' 11"  (1.803 m)   Wt 173 lb 12.8 oz (78.8 kg)   BMI 24.24 kg/m   Physical Exam  GEN: Well nourished, well developed, in no acute distress  Neck: no JVD, carotid bruits, or masses Cardiac:RRR; 2/6 harsh systolic murmur at the apex and  left sternal border Respiratory:  clear to auscultation bilaterally, normal work of breathing GI: soft, nontender, nondistended, + BS Ext: without cyanosis, clubbing, or edema, Good distal pulses bilaterally Skin: Several lesions removed by the dermatologist, one with mild bleeding on his chest Neuro:  Alert and Oriented x 3 Psych: euthymic mood, full affect  Wt Readings from Last 3 Encounters:  07/17/16 173 lb 12.8 oz (78.8 kg)  06/21/16 171 lb (77.6 kg)  06/15/16 171 lb (77.6 kg)      Studies/Labs Reviewed:   EKG:  EKG is not ordered today.    Recent Labs: 12/07/2015: Magnesium 2.1; TSH 0.97 06/02/2016: Hemoglobin 11.9; Platelets 151 06/15/2016: ALT 20; BUN 21; Creat 1.19; Potassium 3.4; Sodium 141   Lipid Panel No results found for: CHOL, TRIG, HDL, CHOLHDL, VLDL, LDLCALC, LDLDIRECT  Additional studies/ records that were reviewed today include:  Exercise nuclear stress test: 04/12/2015  Nuclear stress EF: 61%.  Blood pressure demonstrated a hypertensive response to exercise.  There was no ST segment deviation noted during stress.  The study is normal.  This is a low risk study.  The left ventricular ejection fraction is normal (55-65%).   TTE: 04/12/2015 - Left ventricle: The cavity size was normal. Wall thickness was   increased in a pattern of mild LVH. Systolic function was normal.   The estimated  ejection fraction was in the range of 60% to 65%.   Wall motion was normal; there were no regional wall motion   abnormalities. Doppler parameters are consistent with abnormal   left ventricular relaxation (grade 1 diastolic dysfunction). The   E/e&' ratio is <8, suggesting normal LV filling pressure. - Aortic valve: Trileaflet. Sclerosis without stenosis. There was   mild regurgitation. - Mitral valve: Mildly thickened leaflets . There was trivial   regurgitation. - Left atrium: The atrium was normal in size. - Tricuspid valve: There was trivial regurgitation. - Pulmonary arteries: PA peak pressure: 25 mm Hg (S). - Systemic veins: The IVC Was not visualized.   Impressions: - LVEF 65-70%, mild LVH, normal wall motion, diastolic dysfunction   with normal LV filling pressure, aortic sclerosis with mild AI           ASSESSMENT:    1. Hypertensive heart disease without heart failure   2. Orthostasis   3. Aortic valve insufficiency, etiology of cardiac valve disease unspecified   4. Hypokalemia   5. Palpitations      PLAN:  In order of problems listed above:  Hypertensive heart disease without heart failure. Patient's blood pressure is up a low today but is overall stable at home. Patient keeps a close eye on it. He got very dizzy and hypotensive with losartan and had to stop it. He will continue to monitor his blood pressure at home and call if it goes up. Follow-up with Dr. Meda Coffee in 2-3 months.  Orthostasis has resolved off losartan  Aortic insufficiency and aortic sclerosis stable on recent echo  Hypokalemia follow-up labs today  Palpitations patient had some skipping 5 or 6 days ago. We'll check electrolytes.     Medication Adjustments/Labs and Tests Ordered: Current medicines are reviewed at length with the patient today.  Concerns regarding medicines are outlined above.  Medication changes, Labs and Tests ordered today are listed in the Patient Instructions  below. Patient Instructions  Medication Instructions:  None Ordered   Labwork: Your physician recommends that you return for lab work today for BMET  Testing/Procedures: None Ordered   Follow-Up: Your physician  recommends that you schedule a follow-up appointment in: 2 months with Dr. Meda Coffee  Any Other Special Instructions Will Be Listed Below (If Applicable).     If you need a refill on your cardiac medications before your next appointment, please call your pharmacy.      Signed, Ermalinda Barrios, PA-C  07/17/2016 11:10 AM    [facility] [place], [place], [place]  [address] Phone: [phone]; Fax: [phone]

## 2016-08-31 ENCOUNTER — Encounter: Payer: Self-pay | Admitting: Physician Assistant

## 2016-09-14 NOTE — Progress Notes (Signed)
Cardiology Office Note    Date:  09/15/2016  ID:  ANKUR SNOWDON, DOB 06/25/1933, MRN [id number] PCP:  Susy Frizzle, MD  Cardiologist:  Dr. Meda Coffee   Chief Complaint: f/u BP  History of Present Illness:  Joel Bauer is a 83 y.o. male with history of HTN, hypertensive heart disease, orthostasis following weight loss after laparoscopic low anterior resection for chronic diverticulitis, mild AI by echo 2017, hyperlipidemia, arthritis, retinal hemorrhage, macular degeneration who presents for f/u.   To recap, he saw Dr. Meda Coffee in the past for atypical chest pain with nuc 03/2015 that was normal except for hypertensive response to exercise. 2D Echo 03/2015 showed EF 60-65%, mild LVH, grade 1 DD with normal filling pressure, aortic sclerosis without steonsis, mild AI. At last OV 05/2015 he was exhibiting signs of orthostasis so amlodipine was held. He saw his PCP 09/2015 complaining of palpitations - per PCP note "I auscultated the patient for over 2 minutes and was unable to appreciate any abnormal heartbeats." The patient declined a Holter. Toprol 25mg  daily was added and losartan was held. He was hospitalized in 05/2016 with SBO. He saw Dr. Meda Coffee 06/21/16. He was found to be hypokalemic and PCP wanted to stop Maxide but patient wanted to continue for Mnire's disease. She restarted Maxide, decreased amlodipine and started losartan 50 mg daily. He called in with dizziness and low blood pressure so eventually losartan was stopped again with improvement in symptoms. Last labs showed normal BMET 06/2016 (K 3.7, Cr 0.98); last Hgb in 05/2016 was 11.9, slightly decreased from prior.  He returns for follow-up feeling great. He continues to play tennis, usually in early morning hours before it gets hot. No chest pain, SOB, or recurrent orthostatic symptoms. He continues to have intermittent palpitations but they are rare - none in the last 3 weeks. They feel like a brief skip. No tachypalpitations or any  other associated symptoms. He hasn't noticed any particular trigger. These are not particularly bothersome. He has been occasionally following BP at home with readings [id number] systolic.    Past Medical History:  Diagnosis Date  . Adenomatous colon polyp 2001  . Anemia    as a child   . Aortic regurgitation    a. mild by echo 03/2015  . Arthritis   . Cancer (HCC)    hx of skin cancer   . Diverticulitis of colon - recurrent/persistent 12/29/2014  . Diverticulosis of colon (without mention of hemorrhage) 2005  . GERD (gastroesophageal reflux disease)   . Heart murmur   . History of kidney stones   . History of skin cancer   . Hx of Clostridium difficile infection   . Hyperglycemia   . Hyperlipidemia   . Hypertension   . Hypertensive heart disease   . Macular degeneration    left eye  . Orthostasis   . Pleurisy   . Prostatitis   . 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 Retinal hemorrhage   . Shingles     Past Surgical History:  Procedure Laterality Date  . CATARACTS REMOVED    . COLONOSCOPY    . COLONOSCOPY N/A 06/15/2014   Procedure: COLONOSCOPY;  Surgeon: Gatha Mayer, MD;  Location: [facility];  Service: Endoscopy;  Laterality: N/A;  . CYSTOSCOPY WITH RETROGRADE PYELOGRAM, URETEROSCOPY AND STENT PLACEMENT Right 10/18/2013   Procedure: CYSTOSCOPY WITH RIGHT RETROGRADE/RIGHT URETEROSCOPY, STONE BASKETRY,  AND STENT PLACEMENT RIGHT;  Surgeon: Alexis Frock, MD;  Location: [facility];  Service:  Urology;  Laterality: Right;  . EYE SURGERY     3 injections to left eye for macular degeneration   . FMT  06/2014  . LAPAROSCOPIC LOW ANTERIOR RESECTION N/A 04/19/2015   Procedure: LAPAROSCOPIC ASSISTED  LOW ANTERIOR RESECTION, splenic flexure mobilization;  Surgeon: Fanny Skates, MD;  Location: [facility];  Service: General;  Laterality: N/A;  . LITHOTRIPSY    . TONSILLECTOMY      Current Medications: Current Meds  Medication Sig  . amLODipine (NORVASC) 5 MG tablet  Take 1 tablet (5 mg total) by mouth daily.  . Cholecalciferol (VITAMIN D3) 2000 UNITS TABS Take 1 tablet by mouth daily.  . Coenzyme Q10 (COQ10 PO) Take 100 mg by mouth daily.   . fluticasone (FLONASE) 50 MCG/ACT nasal spray Place 1 spray into both nostrils daily as needed for allergies or rhinitis.  . hydroxypropyl methylcellulose / hypromellose (ISOPTO TEARS / GONIOVISC) 2.5 % ophthalmic solution Place 1 drop into both eyes 3 (three) times daily as needed for dry eyes.  Marland Kitchen KRILL OIL PO Take 1 tablet by mouth daily.   . LUTEIN PO Take 25 mg by mouth daily.   . metoprolol succinate (TOPROL-XL) 25 MG 24 hr tablet Take 1 tablet (25 mg total) by mouth daily.  Marland Kitchen omeprazole (PRILOSEC) 40 MG capsule Take 1 capsule (40 mg total) by mouth daily.  Marland Kitchen OVER THE COUNTER MEDICATION Take 1 capsule by mouth daily. Cumin  . saccharomyces boulardii (FLORASTOR) 250 MG capsule Take 250 mg by mouth daily.  Marland Kitchen triamterene-hydrochlorothiazide (DYAZIDE) 37.5-25 MG capsule Take 1 each (1 capsule total) by mouth daily.     Allergies:   Patient has no known allergies.   Social History   Social History  . Marital status: Widowed    Spouse name: N/A  . Number of children: 1  . Years of education: N/A   Occupational History  . Retired     Social History Main Topics  . Smoking status: Never Smoker  . Smokeless tobacco: Never Used  . Alcohol use No  . Drug use: No  . Sexual activity: Not Asked   Other Topics Concern  . None   Social History Narrative  . None     Family History:  Family History  Problem Relation Age of Onset  . Breast cancer Mother   . Heart disease Mother   . Colon cancer Father   . Colon polyps Father   . Kidney disease Sister     ROS:   Please see the history of present illness.  All other systems are reviewed and otherwise negative.    PHYSICAL EXAM:   VS:  BP 138/60   Pulse 66   Ht 5\' 11"  (1.803 m)   Wt 171 lb (77.6 kg)   SpO2 97%   BMI 23.85 kg/m   BMI: Body mass  index is 23.85 kg/m. GEN: Well nourished, well developed well appearing WM, in no acute distress  HEENT: normocephalic, atraumatic Neck: no JVD, carotid bruits, or masses Cardiac: RRR; no murmurs, rubs, or gallops, no edema  Respiratory:  clear to auscultation bilaterally, normal work of breathing GI: soft, nontender, nondistended, + BS MS: no deformity or atrophy  Skin: warm and dry, no rash Neuro:  Alert and Oriented x 3, Strength and sensation are intact, follows commands Psych: euthymic mood, full affect  Wt Readings from Last 3 Encounters:  09/15/16 171 lb (77.6 kg)  07/17/16 173 lb 12.8 oz (78.8 kg)  06/21/16 171 lb (77.6 kg)  Studies/Labs Reviewed:   EKG:  EKG was not ordered today.  Recent Labs: 12/07/2015: Magnesium 2.1; TSH 0.97 06/02/2016: Hemoglobin 11.9; Platelets 151 06/15/2016: ALT 20 07/17/2016: BUN 22; Creatinine, Ser 0.98; Potassium 3.7; Sodium 140   Lipid Panel No results found for: CHOL, TRIG, HDL, CHOLHDL, VLDL, LDLCALC, LDLDIRECT  Additional studies/ records that were reviewed today include: Summarized above.    ASSESSMENT & PLAN:   1. Hypertensive heart disease without heart failure - appears generally controlled at this time. He reports home BP 638-177 systolic. I think a goal SBP <140 is reasonable. Cannot be overly aggressive with BP given his history of orthostasis. He has no signs or symptoms of CHF on exam. Weight is stable and he remains active without functional limitation. 2. Orthostasis - no recent recurrence. Follow on current regimen. 3. Palpitations - rare, sound like PACs or PVCs by history. Not particularly bothersome for him. He previously declined monitor. Continue surveillance for accelerating symptoms. Continue beta blocker. 4. Aortic insufficiency - mild by echo 03/2015. May be worthwhile to repeat echo in 2019 or 2020 to monitor. No progressive murmur on exam. Will defer timing to primary cardiologist.  Disposition: F/u with Dr.  Meda Coffee in 6 months. Also asked him to make sure his PCP has been following his hemoglobin level as his last value 06/02/16 was slightly below other values he had had (but this was in the setting of SBO, blood draws and IV fluids). He's not had any bleeding.   Medication Adjustments/Labs and Tests Ordered: Current medicines are reviewed at length with the patient today.  Concerns regarding medicines are outlined above. Medication changes, Labs and Tests ordered today are summarized above and listed in the Patient Instructions accessible in Encounters.   Signed, Charlie Pitter, PA-C  09/15/2016 9:56 AM    [facility] [place], [place], [place]  [address] Phone: [phone]; Fax: [phone]

## 2016-09-15 ENCOUNTER — Encounter: Payer: Self-pay | Admitting: Physician Assistant

## 2016-09-15 ENCOUNTER — Ambulatory Visit (INDEPENDENT_AMBULATORY_CARE_PROVIDER_SITE_OTHER): Payer: Medicare Other | Admitting: Physician Assistant

## 2016-09-15 VITALS — BP 138/60 | HR 66 | Ht 71.0 in | Wt 171.0 lb

## 2016-09-15 DIAGNOSIS — I951 Orthostatic hypotension: Secondary | ICD-10-CM

## 2016-09-15 DIAGNOSIS — I351 Nonrheumatic aortic (valve) insufficiency: Secondary | ICD-10-CM | POA: Diagnosis not present

## 2016-09-15 DIAGNOSIS — I119 Hypertensive heart disease without heart failure: Secondary | ICD-10-CM | POA: Diagnosis not present

## 2016-09-15 DIAGNOSIS — R002 Palpitations: Secondary | ICD-10-CM

## 2016-09-15 NOTE — Patient Instructions (Addendum)
Medication Instructions:  Your physician recommends that you continue on your current medications as directed. Please refer to the Current Medication list given to you today.   Labwork: None ordered  Testing/Procedures: None ordered  Follow-Up: Your physician recommends that you schedule a follow-up appointment in: [place]   Any Other Special Instructions Will Be Listed Below (If Applicable).     If you need a refill on your cardiac medications before your next appointment, please call your pharmacy.

## 2016-12-26 ENCOUNTER — Ambulatory Visit (INDEPENDENT_AMBULATORY_CARE_PROVIDER_SITE_OTHER): Payer: Medicare Other | Admitting: Family Medicine

## 2016-12-26 ENCOUNTER — Encounter: Payer: Self-pay | Admitting: Family Medicine

## 2016-12-26 VITALS — BP 140/84 | HR 85 | Temp 97.6°F | Resp 14 | Ht 71.0 in | Wt 174.0 lb

## 2016-12-26 DIAGNOSIS — Z23 Encounter for immunization: Secondary | ICD-10-CM

## 2016-12-26 DIAGNOSIS — R1031 Right lower quadrant pain: Secondary | ICD-10-CM

## 2016-12-26 DIAGNOSIS — G8929 Other chronic pain: Secondary | ICD-10-CM | POA: Diagnosis not present

## 2016-12-26 DIAGNOSIS — R5382 Chronic fatigue, unspecified: Secondary | ICD-10-CM

## 2016-12-26 DIAGNOSIS — R1032 Left lower quadrant pain: Secondary | ICD-10-CM

## 2016-12-26 NOTE — Progress Notes (Signed)
Subjective:    Patient ID: Joel Bauer, male    DOB: 11/09/1933, 83 y.o.   MRN: [id number]  HPI  05/29/16 Patient has a significant past medical history of diverticulitis status post partial colectomy performed last year for recurrent diverticulitis. Beginning Saturday, the patient developed sudden intense epigastric abdominal pain. He describes the pain as a constant 5 out of 10 but occasionally more severe. It is located inferior to the xiphoid process. There are no exacerbating or alleviating factors. He denies any constipation. He does report nausea. He denies any vomiting. He denies any change in bowel habit. He denies any blood in his stool. He denies any melena. He denies any hematochezia. He denies any radiation of the abdominal pain. Patient had a CAT scan of the abdomen and pelvis performed in October 2016 which revealed:  1. Uncomplicated sigmoid diverticulitis. 2.  Atherosclerosis, including within the coronary arteries. 3. Stable appearance of the liver. Left hepatic lobe hemangioma. Right hepatic lobe hemangioma or perfusion anomaly. 4. Prostatomegaly. 5. Left renal cyst and bilateral too small to characterize renal lesions. A tiny interpolar left renal lesion demonstrates apparent complexity, but given presence back to 09/12/2008, is favored to represent a complex cyst. 6. Mild hepatic steatosis.  It was no evidence of an abdominal aortic aneurysm. There was no mention of gallstones although CT scan is not an ideal test to evaluate for this.  Diagnosis includes pancreatitis, peptic ulcer disease, biliary colic, small bowel obstruction although I believe this is unlikely given the fact he still play passing stool and gas, gastroenteritis.  At that time, my plan was: See history of present illness for a differential diagnosis. I will try empiric treatment with 25 mg of Phenergan IM and a stat CBC. Reassess the patient in approximately 15-30 minutes. If his stomach  is improving, and his CBC shows no leukocytosis, will treat the patient empirically for possible viral gastroenteritis. If the abdominal pain is intensifying or there is an elevated white blood cell count, we will need to get imaging of the abdomen to evaluate for possible biliary colic versus pancreatitis  WBC=9 reassuring After 30 minutes, the patient is starting to feel better. I suspect that he has bilateral gastroenteritis. We'll treat the patient was Zofran 4 mg every 8 hours as needed and a bland diet. Recheck in 24 hours. Schedule right upper quadrant ultrasound pain persist  06/15/16 Ultimately, the patient had to go the emergency room where he was diagnosed with a partial small bowel obstruction. With tincture of time and IV fluids, the bowel obstruction resolved spontaneously. He is here today for follow-up. He denies any further abdominal pain. He denies any nausea or vomiting. He denies any hematemesis. He denies any blood in his stool or melena. His blood pressure has been well controlled at 130/76 discharge from the hospital. He is not taking Diovan but is taking Maxide. Overall he feels much better with no concerns.  AT that time, my plan was: Repeat a CMP to monitor his potassium given his history of hypokalemia. Partial small bowel obstruction has resolved. Most likely due to adhesions. No further workup is necessary as long as symptoms do not return. Patient gets recurrent small bowel obstructions, may need general surgery consultation to evaluate for lysis of adhesions.  12/26/16 Over the last month, the patient has developed chronic bilateral lower abdominal pain. He describes it as a constant pressure-like sensation. He is having to or more bowel movements a day.  He denies any gas. He denies any bloating. However he states that the pain is similar to the pain that he experienced prior to his admission in March for bowel obstruction. He denies any nausea or vomiting. Denies any reflux.  He denies any bilious emesis. He denies any fevers or chills. The pain is constant. There is no exacerbating or alleviating factors. He does report increasing mucus in his stool. He also is reporting worsening fatigue. He denies any melena or hematochezia. He denies any hematemesis. Wt Readings from Last 3 Encounters:  12/26/16 174 lb (78.9 kg)  09/15/16 171 lb (77.6 kg)  07/17/16 173 lb 12.8 oz (78.8 kg)      Past Medical History:  Diagnosis Date  . Adenomatous colon polyp 2001  . Anemia    as a child   . Aortic regurgitation    a. mild by echo 03/2015  . Arthritis   . Cancer (HCC)    hx of skin cancer   . Diverticulitis of colon - recurrent/persistent 12/29/2014  . Diverticulosis of colon (without mention of hemorrhage) 2005  . GERD (gastroesophageal reflux disease)   . Heart murmur   . History of kidney stones   . History of skin cancer   . Hx of Clostridium difficile infection   . Hyperglycemia   . Hyperlipidemia   . Hypertension   . Hypertensive heart disease   . Macular degeneration    left eye  . Orthostasis   . Pleurisy   . Prostatitis   . 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 Retinal hemorrhage   . Shingles    Past Surgical History:  Procedure Laterality Date  . CATARACTS REMOVED    . COLONOSCOPY    . COLONOSCOPY N/A 06/15/2014   Procedure: COLONOSCOPY;  Surgeon: Gatha Mayer, MD;  Location: [place];  Service: Endoscopy;  Laterality: N/A;  . CYSTOSCOPY WITH RETROGRADE PYELOGRAM, URETEROSCOPY AND STENT PLACEMENT Right 10/18/2013   Procedure: CYSTOSCOPY WITH RIGHT RETROGRADE/RIGHT URETEROSCOPY, STONE BASKETRY,  AND STENT PLACEMENT RIGHT;  Surgeon: Alexis Frock, MD;  Location: [facility];  Service: Urology;  Laterality: Right;  . EYE SURGERY     3 injections to left eye for macular degeneration   . FMT  06/2014  . LAPAROSCOPIC LOW ANTERIOR RESECTION N/A 04/19/2015   Procedure: LAPAROSCOPIC ASSISTED  LOW ANTERIOR RESECTION, splenic flexure  mobilization;  Surgeon: Fanny Skates, MD;  Location: [facility];  Service: General;  Laterality: N/A;  . LITHOTRIPSY    . TONSILLECTOMY     Current Outpatient Prescriptions on File Prior to Visit  Medication Sig Dispense Refill  . Cholecalciferol (VITAMIN D3) 2000 UNITS TABS Take 1 tablet by mouth daily.    . Coenzyme Q10 (COQ10 PO) Take 100 mg by mouth daily.     . fluticasone (FLONASE) 50 MCG/ACT nasal spray Place 1 spray into both nostrils daily as needed for allergies or rhinitis.    . hydroxypropyl methylcellulose / hypromellose (ISOPTO TEARS / GONIOVISC) 2.5 % ophthalmic solution Place 1 drop into both eyes 3 (three) times daily as needed for dry eyes.    Marland Kitchen KRILL OIL PO Take 1 tablet by mouth daily.     . LUTEIN PO Take 25 mg by mouth daily.     . metoprolol succinate (TOPROL-XL) 25 MG 24 hr tablet Take 1 tablet (25 mg total) by mouth daily. 90 tablet 3  . omeprazole (PRILOSEC) 40 MG capsule Take 1 capsule (40 mg total) by mouth daily. 30 capsule 0  .  OVER THE COUNTER MEDICATION Take 1 capsule by mouth daily. Cumin    . saccharomyces boulardii (FLORASTOR) 250 MG capsule Take 250 mg by mouth daily.    Marland Kitchen triamterene-hydrochlorothiazide (DYAZIDE) 37.5-25 MG capsule Take 1 each (1 capsule total) by mouth daily. 90 capsule 3  . amLODipine (NORVASC) 5 MG tablet Take 1 tablet (5 mg total) by mouth daily. 30 tablet 11   No current facility-administered medications on file prior to visit.    No Known Allergies Social History   Social History  . Marital status: Widowed    Spouse name: N/A  . Number of children: 1  . Years of education: N/A   Occupational History  . Retired     Social History Main Topics  . Smoking status: Never Smoker  . Smokeless tobacco: Never Used  . Alcohol use No  . Drug use: No  . Sexual activity: Not on file   Other Topics Concern  . Not on file   Social History Narrative  . No narrative on file      Review of Systems  Gastrointestinal: Positive for  abdominal pain.  All other systems reviewed and are negative.      Objective:   Physical Exam  Constitutional: He appears well-developed and well-nourished. No distress.  Cardiovascular: Normal rate, regular rhythm, normal heart sounds and intact distal pulses.   No murmur heard. Pulmonary/Chest: Effort normal and breath sounds normal. No respiratory distress. He has no wheezes. He has no rales.  Abdominal: Soft. Bowel sounds are normal. He exhibits no distension and no mass. There is no tenderness. There is no rebound and no guarding.  Vitals reviewed.         Assessment & Plan:  Chronic fatigue - Plan: CBC with Differential/Platelet, COMPLETE METABOLIC PANEL WITH GFR, TSH  Flu vaccine need - Plan: Flu Vaccine QUAD 36+ mos IM  Abdominal pain, chronic, bilateral lower quadrant - Plan: CT Abdomen Pelvis W Contrast Symptoms are nonspecific. He does have a history of C. difficile colitis however he denies any diarrhea. He does have a history of a small bowel obstruction that resolved spontaneously however he has no nausea or vomiting and he still is producing normal bowel movements. The chronic abdominal pain raises the concern for irritable bowel particularly given his history of mucus in his stools versus adhesions secondary to previous surgery. Given his advanced age, however I will check a CT scan of the abdomen and pelvis given the fact the pain has been persistent for more than a month. Also given his fatigue I will check a CBC, CMP, and TSH. Consider a GI referral if pain persists and CT scan is negative versus general surgery consultations for lysis of adhesions. He denies any dysuria, frequency, urgency, hesitancy, or hematuria

## 2016-12-27 LAB — CBC WITH DIFFERENTIAL/PLATELET
BASOS ABS: 72 {cells}/uL (ref 0–200)
Basophils Relative: 1 %
EOS PCT: 3.3 %
Eosinophils Absolute: 238 cells/uL (ref 15–500)
HCT: 41.1 % (ref 38.5–50.0)
HEMOGLOBIN: 14.1 g/dL (ref 13.2–17.1)
Lymphs Abs: 1498 cells/uL (ref 850–3900)
MCH: 30.2 pg (ref 27.0–33.0)
MCHC: 34.3 g/dL (ref 32.0–36.0)
MCV: 88 fL (ref 80.0–100.0)
MONOS PCT: 7.3 %
MPV: 10.3 fL (ref 7.5–12.5)
Neutro Abs: 4867 cells/uL (ref 1500–7800)
Neutrophils Relative %: 67.6 %
PLATELETS: 181 10*3/uL (ref 140–400)
RBC: 4.67 10*6/uL (ref 4.20–5.80)
RDW: 14 % (ref 11.0–15.0)
TOTAL LYMPHOCYTE: 20.8 %
WBC mixed population: 526 cells/uL (ref 200–950)
WBC: 7.2 10*3/uL (ref 3.8–10.8)

## 2016-12-27 LAB — COMPLETE METABOLIC PANEL WITH GFR
AG Ratio: 2 (calc) (ref 1.0–2.5)
ALBUMIN MSPROF: 4.3 g/dL (ref 3.6–5.1)
ALT: 31 U/L (ref 9–46)
AST: 29 U/L (ref 10–35)
Alkaline phosphatase (APISO): 60 U/L (ref 40–115)
BUN/Creatinine Ratio: 15 (calc) (ref 6–22)
BUN: 21 mg/dL (ref 7–25)
CO2: 27 mmol/L (ref 20–32)
CREATININE: 1.37 mg/dL — AB (ref 0.70–1.11)
Calcium: 9.3 mg/dL (ref 8.6–10.3)
Chloride: 100 mmol/L (ref 98–110)
GFR, Est African American: 56 mL/min/{1.73_m2} — ABNORMAL LOW (ref >=60)
GFR, Est Non African American: 48 mL/min/{1.73_m2} — ABNORMAL LOW (ref >=60)
GLUCOSE: 97 mg/dL (ref 65–99)
Globulin: 2.1 g/dL (calc) (ref 1.9–3.7)
Potassium: 3.7 mmol/L (ref 3.5–5.3)
Sodium: 140 mmol/L (ref 135–146)
Total Bilirubin: 0.6 mg/dL (ref 0.2–1.2)
Total Protein: 6.4 g/dL (ref 6.1–8.1)

## 2016-12-27 LAB — TSH: TSH: 0.89 mIU/L (ref 0.40–4.50)

## 2016-12-28 ENCOUNTER — Ambulatory Visit
Admission: RE | Admit: 2016-12-28 | Discharge: 2016-12-28 | Disposition: A | Discharge status: Home / Self Care | Payer: Medicare Other | Source: Ambulatory Visit | Attending: Family Medicine | Admitting: Family Medicine

## 2016-12-28 DIAGNOSIS — G8929 Other chronic pain: Secondary | ICD-10-CM

## 2016-12-28 DIAGNOSIS — R1031 Right lower quadrant pain: Principal | ICD-10-CM

## 2016-12-28 DIAGNOSIS — R1032 Left lower quadrant pain: Principal | ICD-10-CM

## 2016-12-28 MED ORDER — IOPAMIDOL (ISOVUE-300) INJECTION 61%
100.0000 mL | Freq: Once | INTRAVENOUS | Status: AC | PRN
  Administered 2016-12-28: 100 mL via INTRAVENOUS

## 2017-01-13 ENCOUNTER — Other Ambulatory Visit: Payer: Self-pay | Admitting: Family Medicine

## 2017-01-13 DIAGNOSIS — R002 Palpitations: Secondary | ICD-10-CM

## 2017-01-23 ENCOUNTER — Telehealth: Payer: Self-pay | Admitting: Cardiology

## 2017-01-23 DIAGNOSIS — R002 Palpitations: Secondary | ICD-10-CM

## 2017-01-23 NOTE — Telephone Encounter (Signed)
Pt calling to request for a monitor to be placed, for complaints of palpitations. Pt states that he's had intermittent palpitations for the past 3 days.  Pt states it's very sporadic in nature.  Pt states he's not having palpitations at this moment.  Pt states he has a history of PACs and PVCs, and was instructed at his last OV with the PA, to call our office if he felt palpitations, and request for an order for a monitor to be placed.  Pt states that when he last saw [facility], she wanted to place one on the pt,  but he declined this.  Pt states he has no chest pain or sob, just palpitations.  Pt states he is in no acute distress at this time.  Pt does not have a log of BP and HR recordings.  Advised the pt to limit his caffeine and salt intake, and continue his beta blocker regimen.  Informed the pt that Dr Meda Coffee is out of the office today, but I will route this message to her for further review and recommendation, and follow-up with the pt shortly thereafter.  Pt verbalized understanding and agrees with this plan.

## 2017-01-23 NOTE — Telephone Encounter (Signed)
Patient c/o Palpitations:  High priority if patient c/o lightheadedness, shortness of breath, or chest pain  1) How long have you had palpitations/irregular HR/ Afib? Are you having the symptoms now? Three days. "A little bit."  2) Are you currently experiencing lightheadedness, SOB or CP? Lightheaded in the past three days  3) Do you have a history of afib (atrial fibrillation) or irregular heart rhythm? No, never had AFIB  4) Have you checked your BP or HR? (document readings if available):  5) Are you experiencing any other symptoms? No

## 2017-01-23 NOTE — Telephone Encounter (Signed)
Please start a 48 hour Holter monitor, Thank you, KN

## 2017-01-23 NOTE — Telephone Encounter (Signed)
Notified the pt that per Dr Meda Coffee, she recommends that we order for him to have a 48 hour holter monitor done, for complaints of palpitations.  Informed the pt that I will place the order in the system, and send a message to our [facility] schedulers to call and arrange this appt.  Pt verbalized understanding and agrees with this plan.

## 2017-01-26 NOTE — Telephone Encounter (Signed)
Pt scheduled for 48 hour holter monitor on 11/13 at 1030.  Pt made aware of appt date and time by [facility] scheduling.

## 2017-01-30 ENCOUNTER — Ambulatory Visit (INDEPENDENT_AMBULATORY_CARE_PROVIDER_SITE_OTHER): Payer: Medicare Other

## 2017-01-30 DIAGNOSIS — R002 Palpitations: Secondary | ICD-10-CM

## 2017-02-08 ENCOUNTER — Other Ambulatory Visit: Payer: Self-pay | Admitting: Family Medicine

## 2017-02-08 DIAGNOSIS — K219 Gastro-esophageal reflux disease without esophagitis: Secondary | ICD-10-CM

## 2017-03-03 ENCOUNTER — Encounter (HOSPITAL_COMMUNITY): Payer: Self-pay | Admitting: Emergency Medicine

## 2017-03-03 ENCOUNTER — Ambulatory Visit (HOSPITAL_COMMUNITY)
Admission: EM | Admit: 2017-03-03 | Discharge: 2017-03-03 | Disposition: A | Discharge status: Home / Self Care | Payer: Medicare Other | Attending: Family Medicine | Admitting: Family Medicine

## 2017-03-03 DIAGNOSIS — S60351A Superficial foreign body of right thumb, initial encounter: Secondary | ICD-10-CM

## 2017-03-03 NOTE — ED Triage Notes (Signed)
PT C/O: believes splinter is lodged in right thumb associated w/swelling, pain   ONSET: 3 day   TAKING MEDS:  None   A&O x4... NAD... Ambulatory

## 2017-03-07 NOTE — ED Provider Notes (Signed)
[place]   [id number] 03/03/17 Arrival Time: 7124  ASSESSMENT & PLAN:  1. Foreign body of right thumb, initial encounter    Procedure: R thumb cleaned with betadine. Approx 2cc lidocaine with epi injected under presumed site of FB. #11 scalpel used to make a small incision. Small amount of pus expressed along with a 70mm splinter. He tolerated procedure well. No complications. Minimal bleeding, easily controlled. Local wound care instructions. May f/u as needed.  Reviewed expectations re: course of current medical issues. Questions answered. Outlined signs and symptoms indicating need for more acute intervention. Patient verbalized understanding. After Visit Summary given.   SUBJECTIVE: History from: patient. LC JOYNT is a 83 y.o. male who presents with complaint of persistent R thumb discomfort. Thinks there may be a splinter present under pad of thumb. More tender over the past day. Questions infection. Afebrile. No self treatment. No specific aggravating or alleviating factors reported.  ROS: As per HPI.   OBJECTIVE:  Vitals:   03/03/17 1850  BP: (!) 150/62  Pulse: 70  Resp: 18  Temp: 98.1 F (36.7 C)  TempSrc: Oral  SpO2: 98%    General appearance: alert; no distress Extremities: R thumb pad with approx 25mm induration; tender; no drainage Skin: warm and dry Psychological: alert and cooperative; normal mood and affect   No Known Allergies  Past Medical History:  Diagnosis Date  . Adenomatous colon polyp 2001  . Anemia    as a child   . Aortic regurgitation    a. mild by echo 03/2015  . Arthritis   . Cancer (HCC)    hx of skin cancer   . Diverticulitis of colon - recurrent/persistent 12/29/2014  . Diverticulosis of colon (without mention of hemorrhage) 2005  . GERD (gastroesophageal reflux disease)   . Heart murmur   . History of kidney stones   . History of skin cancer   . Hx of Clostridium difficile infection   . Hyperglycemia   .  Hyperlipidemia   . Hypertension   . Hypertensive heart disease   . Macular degeneration    left eye  . Orthostasis   . Pleurisy   . Prostatitis   . 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 Retinal hemorrhage   . Shingles    Social History   Socioeconomic History  . Marital status: Widowed    Spouse name: Not on file  . Number of children: 1  . Years of education: Not on file  . Highest education level: Not on file  Social Needs  . Financial resource strain: Not on file  . Food insecurity - worry: Not on file  . Food insecurity - inability: Not on file  . Transportation needs - medical: Not on file  . Transportation needs - non-medical: Not on file  Occupational History  . Occupation: Retired   Tobacco Use  . Smoking status: Never Smoker  . Smokeless tobacco: Never Used  Substance and Sexual Activity  . Alcohol use: No    Alcohol/week: 0.0 oz  . Drug use: No  . Sexual activity: Not on file  Other Topics Concern  . Not on file  Social History Narrative  . Not on file   Family History  Problem Relation Age of Onset  . Breast cancer Mother   . Heart disease Mother   . Colon cancer Father   . Colon polyps Father   . Kidney disease Sister    Past Surgical History:  Procedure Laterality  Date  . CATARACTS REMOVED    . COLONOSCOPY    . COLONOSCOPY N/A 06/15/2014   Procedure: COLONOSCOPY;  Surgeon: Gatha Mayer, MD;  Location: [place];  Service: Endoscopy;  Laterality: N/A;  . CYSTOSCOPY WITH RETROGRADE PYELOGRAM, URETEROSCOPY AND STENT PLACEMENT Right 10/18/2013   Procedure: CYSTOSCOPY WITH RIGHT RETROGRADE/RIGHT URETEROSCOPY, STONE BASKETRY,  AND STENT PLACEMENT RIGHT;  Surgeon: Alexis Frock, MD;  Location: [facility];  Service: Urology;  Laterality: Right;  . EYE SURGERY     3 injections to left eye for macular degeneration   . FMT  06/2014  . LAPAROSCOPIC LOW ANTERIOR RESECTION N/A 04/19/2015   Procedure: LAPAROSCOPIC ASSISTED  LOW ANTERIOR RESECTION,  splenic flexure mobilization;  Surgeon: Fanny Skates, MD;  Location: [facility];  Service: General;  Laterality: N/A;  . LITHOTRIPSY    . Evonnie Dawes, MD 03/07/17 1143

## 2017-03-19 ENCOUNTER — Ambulatory Visit: Payer: Medicare Other | Admitting: Cardiology

## 2017-03-19 VITALS — BP 120/62 | HR 62 | Ht 71.0 in | Wt 174.0 lb

## 2017-03-19 DIAGNOSIS — I493 Ventricular premature depolarization: Secondary | ICD-10-CM | POA: Diagnosis not present

## 2017-03-19 DIAGNOSIS — I119 Hypertensive heart disease without heart failure: Secondary | ICD-10-CM

## 2017-03-19 DIAGNOSIS — R002 Palpitations: Secondary | ICD-10-CM | POA: Diagnosis not present

## 2017-03-19 NOTE — Telephone Encounter (Signed)
error 

## 2017-03-19 NOTE — Progress Notes (Signed)
Cardiology Office Note    Date:  03/21/2017   ID:  Joel Bauer, DOB 1934-02-25, MRN [id number]  PCP:  Susy Frizzle, MD  Cardiologist: Dr. Meda Coffee  Chief complain: 6 months follow up  History of Present Illness:  Joel Bauer is a 83 y.o. male  with history of HTN, hypertensive heart disease, orthostasis following weight loss after laparoscopic low anterior resection for chronic diverticulitis, hyperlipidemia,arthritis, retinal hemorrhage, macular degeneration who presents for f/u. Saw Dr. Meda Coffee in the past for atypical chest pain with nuc 03/2015 that was normal except for hypertensive response to exercise. 2D Echo 03/2015 showed EF 60-65%, mild LVH, grade 1 DD with normal filling pressure, aortic sclerosis without steonsis, mild AI. He had palpitations treated with metoprolol and losartan was stopped.  06/21/2016, patient was hospitalized recently for another episode of diverticulitis that didn't require surgery but he has lost at least 5 pounds of weight. He states that he has been feeling okay he denies any chest pain shortness of breath, he has occasional lower extremity edema no orthopnea or proximal nocturnal dyspnea. He feels skipped beats once or twice a day but no sustained tachycardias dizziness or syncope.  03/19/2017 - 6 months follow up, he is doing well, a few weeks ago he had frequent PVCs, no associated CP, SOB, dizziness or syncope. He underwent a 48 Holter monitor. Now resolved.He is active and asymptomatic, no claudications, tolerating medications well.  Past Medical History:  Diagnosis Date  . Adenomatous colon polyp 2001  . Anemia    as a child   . Aortic regurgitation    a. mild by echo 03/2015  . Arthritis   . Cancer (HCC)    hx of skin cancer   . Diverticulitis of colon - recurrent/persistent 12/29/2014  . Diverticulosis of colon (without mention of hemorrhage) 2005  . GERD (gastroesophageal reflux disease)   . Heart murmur   . History of kidney  stones   . History of skin cancer   . Hx of Clostridium difficile infection   . Hyperglycemia   . Hyperlipidemia   . Hypertension   . Hypertensive heart disease   . Macular degeneration    left eye  . Orthostasis   . Pleurisy   . Prostatitis   . 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 Retinal hemorrhage   . Shingles     Past Surgical History:  Procedure Laterality Date  . CATARACTS REMOVED    . COLONOSCOPY    . COLONOSCOPY N/A 06/15/2014   Procedure: COLONOSCOPY;  Surgeon: Gatha Mayer, MD;  Location: [place];  Service: Endoscopy;  Laterality: N/A;  . CYSTOSCOPY WITH RETROGRADE PYELOGRAM, URETEROSCOPY AND STENT PLACEMENT Right 10/18/2013   Procedure: CYSTOSCOPY WITH RIGHT RETROGRADE/RIGHT URETEROSCOPY, STONE BASKETRY,  AND STENT PLACEMENT RIGHT;  Surgeon: Alexis Frock, MD;  Location: [facility];  Service: Urology;  Laterality: Right;  . EYE SURGERY     3 injections to left eye for macular degeneration   . FMT  06/2014  . LAPAROSCOPIC LOW ANTERIOR RESECTION N/A 04/19/2015   Procedure: LAPAROSCOPIC ASSISTED  LOW ANTERIOR RESECTION, splenic flexure mobilization;  Surgeon: Fanny Skates, MD;  Location: [facility];  Service: General;  Laterality: N/A;  . LITHOTRIPSY    . TONSILLECTOMY      Current Medications: Outpatient Medications Prior to Visit  Medication Sig Dispense Refill  . amLODipine (NORVASC) 5 MG tablet Take 1 tablet (5 mg total) by mouth daily. 30 tablet 11  . Cholecalciferol (  VITAMIN D3) 2000 UNITS TABS Take 1 tablet by mouth daily.    . Coenzyme Q10 (COQ10 PO) Take 100 mg by mouth daily.     . fluticasone (FLONASE) 50 MCG/ACT nasal spray Place 1 spray into both nostrils daily as needed for allergies or rhinitis.    . hydroxypropyl methylcellulose / hypromellose (ISOPTO TEARS / GONIOVISC) 2.5 % ophthalmic solution Place 1 drop into both eyes 3 (three) times daily as needed for dry eyes.    Marland Kitchen KRILL OIL PO Take 1 tablet by mouth daily.     . LUTEIN PO Take 25  mg by mouth daily.     . metoprolol succinate (TOPROL-XL) 25 MG 24 hr tablet TAKE 1 TABLET BY MOUTH EVERY DAY 90 tablet 3  . omeprazole (PRILOSEC) 40 MG capsule TAKE 1 CAPSULE (40 MG TOTAL) BY MOUTH DAILY. 90 capsule 2  . OVER THE COUNTER MEDICATION Take 1 capsule by mouth daily. Cumin    . saccharomyces boulardii (FLORASTOR) 250 MG capsule Take 250 mg by mouth daily.    Marland Kitchen triamterene-hydrochlorothiazide (DYAZIDE) 37.5-25 MG capsule Take 1 each (1 capsule total) by mouth daily. 90 capsule 3  . omeprazole (PRILOSEC) 40 MG capsule Take 1 capsule (40 mg total) by mouth daily. 30 capsule 0   No facility-administered medications prior to visit.      Allergies:   Patient has no known allergies.   Social History   Socioeconomic History  . Marital status: Widowed    Spouse name: Not on file  . Number of children: 1  . Years of education: Not on file  . Highest education level: Not on file  Social Needs  . Financial resource strain: Not on file  . Food insecurity - worry: Not on file  . Food insecurity - inability: Not on file  . Transportation needs - medical: Not on file  . Transportation needs - non-medical: Not on file  Occupational History  . Occupation: Retired   Tobacco Use  . Smoking status: Never Smoker  . Smokeless tobacco: Never Used  Substance and Sexual Activity  . Alcohol use: No    Alcohol/week: 0.0 oz  . Drug use: No  . Sexual activity: Not on file  Other Topics Concern  . Not on file  Social History Narrative  . Not on file     Family History:  The patient's family history includes Breast cancer in his mother; Colon cancer in his father; Colon polyps in his father; Heart disease in his mother; Kidney disease in his sister.   ROS:   Please see the history of present illness.    ROS All other systems reviewed and are negative.   PHYSICAL EXAM:   VS:  BP 120/62   Pulse 62   Ht 5\' 11"  (1.803 m)   Wt 174 lb (78.9 kg)   BMI 24.27 kg/m   Physical Exam  GEN:  Well nourished, well developed, in no acute distress  HEENT: normal  Neck: no JVD, carotid bruits, or masses Cardiac:RRR; no murmurs, rubs, or gallops  Respiratory:  clear to auscultation bilaterally, normal work of breathing GI: soft, nontender, nondistended, + BS Ext: without cyanosis, clubbing, or edema, Good distal pulses bilaterally MS: no deformity or atrophy  Skin: warm and dry, no rash Neuro:  Alert and Oriented x 3, Strength and sensation are intact Psych: euthymic mood, full affect  Wt Readings from Last 3 Encounters:  03/19/17 174 lb (78.9 kg)  12/26/16 174 lb (78.9 kg)  09/15/16  171 lb (77.6 kg)      Studies/Labs Reviewed:   EKG:  EKG is ordered today 03/19/2017 .  It shows normal sinus rhythm normal EKG. Personally reviewed.  Recent Labs: 12/26/2016: ALT 31; BUN 21; Creat 1.37; Hemoglobin 14.1; Platelets 181; Potassium 3.7; Sodium 140; TSH 0.89   Lipid Panel No results found for: CHOL, TRIG, HDL, CHOLHDL, VLDL, LDLCALC, LDLDIRECT  Additional studies/ records that were reviewed today include:  Exercise nuclear stress test: 04/12/2015  Nuclear stress EF: 61%.  Blood pressure demonstrated a hypertensive response to exercise.  There was no ST segment deviation noted during stress.  The study is normal.  This is a low risk study.  The left ventricular ejection fraction is normal (55-65%).   TTE: 04/12/2015 - Left ventricle: The cavity size was normal. Wall thickness was   increased in a pattern of mild LVH. Systolic function was normal.   The estimated ejection fraction was in the range of 60% to 65%.   Wall motion was normal; there were no regional wall motion   abnormalities. Doppler parameters are consistent with abnormal   left ventricular relaxation (grade 1 diastolic dysfunction). The   E/e&' ratio is <8, suggesting normal LV filling pressure. - Aortic valve: Trileaflet. Sclerosis without stenosis. There was   mild regurgitation. - Mitral valve: Mildly  thickened leaflets . There was trivial   regurgitation. - Left atrium: The atrium was normal in size. - Tricuspid valve: There was trivial regurgitation. - Pulmonary arteries: PA peak pressure: 25 mm Hg (S). - Systemic veins: The IVC Was not visualized.   Impressions: - LVEF 65-70%, mild LVH, normal wall motion, diastolic dysfunction   with normal LV filling pressure, aortic sclerosis with mild AI    ASSESSMENT:    1. Frequent PVCs   2. Palpitations   3. Hypertensive heart disease without heart failure    PLAN:  In order of problems listed above:  1.  Chest pain - atypical, with risk factors - including age, sex, HTN, HLP, negative stress test in January 2017, chest pain has resolved. Stress test showed hypertensive response to stress.  2. Hypertensive heart disease without heart failure, with hypertensive response to stress, and LVH on echocardiogram LVEF 65-70%.  Currently well controlled.  3. Palpitations - 48 H Holter monitor in Nov 2018 showed SR, 800 PACs and 1200 PVCs. He is re-assured that < 1% ectopy with normal LVEF and no prior MI is a benign condition. He is currently asymptomatic.   Medication Adjustments/Labs and Tests Ordered: Current medicines are reviewed at length with the patient today.  Concerns regarding medicines are outlined above.  Medication changes, Labs and Tests ordered today are listed in the Patient Instructions below. Patient Instructions  Medication Instructions:  Your provider recommends that you continue on your current medications as directed. Please refer to the Current Medication list given to you today.    Labwork: None  Testing/Procedures: None  Follow up: Your provider wants you to follow-up in: 6 months with Dr. Meda Coffee. You will receive a reminder letter in the mail two months in advance. If you don't receive a letter, please call our office to schedule the follow-up appointment.    Any Other Special Instructions Will Be Listed  Below (If Applicable).     If you need a refill on your cardiac medications before your next appointment, please call your pharmacy.      Signed, Ena Dawley, MD  03/21/2017 11:10 PM    [address], [place], [place]  [address] Phone: [phone]; Fax: [phone]

## 2017-03-19 NOTE — Patient Instructions (Signed)
Medication Instructions:  Your provider recommends that you continue on your current medications as directed. Please refer to the Current Medication list given to you today.    Labwork: None  Testing/Procedures: None  Follow up: Your provider wants you to follow-up in: 6 months with Dr. Meda Coffee. You will receive a reminder letter in the mail two months in advance. If you don't receive a letter, please call our office to schedule the follow-up appointment.    Any Other Special Instructions Will Be Listed Below (If Applicable).     If you need a refill on your cardiac medications before your next appointment, please call your pharmacy.

## 2017-06-18 ENCOUNTER — Encounter: Payer: Self-pay | Admitting: Physician Assistant

## 2017-06-18 ENCOUNTER — Ambulatory Visit (INDEPENDENT_AMBULATORY_CARE_PROVIDER_SITE_OTHER): Payer: Medicare Other | Admitting: Physician Assistant

## 2017-06-18 VITALS — BP 140/78 | HR 69 | Temp 97.9°F | Resp 16 | Ht 71.0 in | Wt 178.2 lb

## 2017-06-18 DIAGNOSIS — B9689 Other specified bacterial agents as the cause of diseases classified elsewhere: Secondary | ICD-10-CM

## 2017-06-18 DIAGNOSIS — J988 Other specified respiratory disorders: Secondary | ICD-10-CM

## 2017-06-18 MED ORDER — AZITHROMYCIN 250 MG PO TABS: ORAL_TABLET | ORAL | 0 refills | Status: DC

## 2017-06-18 NOTE — Progress Notes (Signed)
Patient ID: Joel Bauer MRN: [id number], DOB: May 18, 1933, 84 y.o. Date of Encounter: 06/18/2017, 3:57 PM    Chief Complaint:  Chief Complaint  Patient presents with  . Cough    symptoms for a week   . Nasal Congestion     HPI: 84 y.o. year old male presents with above.   He has been having congestion for about 2 weeks but it is has been increased for the past 4 or 5 days.  Says that it is mostly in his chest.  Is not having much congestion in his head or nose.  No rhinorrhea.  He really has not been able to get any phlegm out but he can feel congestion in his chest.  Has had no fevers or chills.     Home Meds:   Outpatient Medications Prior to Visit  Medication Sig Dispense Refill  . amLODipine (NORVASC) 5 MG tablet Take 1 tablet (5 mg total) by mouth daily. 30 tablet 11  . Cholecalciferol (VITAMIN D3) 2000 UNITS TABS Take 1 tablet by mouth daily.    . Coenzyme Q10 (COQ10 PO) Take 100 mg by mouth daily.     . fluticasone (FLONASE) 50 MCG/ACT nasal spray Place 1 spray into both nostrils daily as needed for allergies or rhinitis.    . hydroxypropyl methylcellulose / hypromellose (ISOPTO TEARS / GONIOVISC) 2.5 % ophthalmic solution Place 1 drop into both eyes 3 (three) times daily as needed for dry eyes.    Marland Kitchen KRILL OIL PO Take 1 tablet by mouth daily.     . LUTEIN PO Take 25 mg by mouth daily.     . metoprolol succinate (TOPROL-XL) 25 MG 24 hr tablet TAKE 1 TABLET BY MOUTH EVERY DAY 90 tablet 3  . omeprazole (PRILOSEC) 40 MG capsule TAKE 1 CAPSULE (40 MG TOTAL) BY MOUTH DAILY. 90 capsule 2  . OVER THE COUNTER MEDICATION Take 1 capsule by mouth daily. Cumin    . saccharomyces boulardii (FLORASTOR) 250 MG capsule Take 250 mg by mouth daily.    Marland Kitchen triamterene-hydrochlorothiazide (DYAZIDE) 37.5-25 MG capsule Take 1 each (1 capsule total) by mouth daily. 90 capsule 3   No facility-administered medications prior to visit.     Allergies: No Known Allergies    Review of Systems:  See HPI for pertinent ROS. All other ROS negative.    Physical Exam: Blood pressure 140/78, pulse 69, temperature 97.9 F (36.6 C), temperature source Oral, resp. rate 16, height 5\' 11"  (1.803 m), weight 80.8 kg (178 lb 3.2 oz), SpO2 97 %., Body mass index is 24.85 kg/m. General:  WNWD WM. Appears in no acute distress. HEENT: Normocephalic, atraumatic, eyes without discharge, sclera non-icteric, nares are without discharge. Bilateral auditory canals clear, TM's are without perforation, pearly grey and translucent with reflective cone of light bilaterally. Oral cavity moist, posterior pharynx without exudate, erythema, peritonsillar abscess.  Neck: Supple. No thyromegaly. No lymphadenopathy. Lungs: Clear bilaterally to auscultation without wheezes, rales, or rhonchi. Breathing is unlabored. Heart: Regular rhythm. No murmurs, rubs, or gallops. Msk:  Strength and tone normal for age. Extremities/Skin: Warm and dry.  Neuro: Alert and oriented X 3. Moves all extremities spontaneously. Gait is normal. CNII-XII grossly in tact. Psych:  Responds to questions appropriately with a normal affect.     ASSESSMENT AND PLAN:  84 y.o. year old male with  1. Bacterial respiratory infection He is to take antibiotic as directed.  Follow-up if symptoms do not resolve within 1 week after completion  of antibiotic. - azithromycin (ZITHROMAX) 250 MG tablet; Day 1: Take 2 daily.  Days 2 -5: Take 1 daily.  Dispense: 6 tablet; Refill: 0   Signed, [address] [place], [place], [facility] 06/18/2017 3:57 PM

## 2017-06-27 ENCOUNTER — Other Ambulatory Visit: Payer: Self-pay | Admitting: Cardiology

## 2017-07-23 ENCOUNTER — Other Ambulatory Visit: Payer: Self-pay

## 2017-07-23 DIAGNOSIS — C4492 Squamous cell carcinoma of skin, unspecified: Secondary | ICD-10-CM

## 2017-07-23 HISTORY — DX: Squamous cell carcinoma of skin, unspecified: C44.92

## 2017-09-11 NOTE — Progress Notes (Signed)
Cardiology Office Note    Date:  09/12/2017   ID:  Joel Bauer, DOB 1933/06/07, MRN [id number]  PCP:  Susy Frizzle, MD  Cardiologist: Ena Dawley, MD  Chief Complaint  Patient presents with  . Follow-up    History of Present Illness:  Joel Bauer is a 84 y.o. male with history of hypertension, orthostasis after weight loss from chronic diverticulitis, HLD, atypical chest pain with normal nuclear stress test in 2017, 2D echo 2017 LVEF 60 to 65% with mild LVH, grade 1 DD, aortic sclerosis without stenosis, mild AI.  Also has palpitations and PVCs controlled with metoprolol.  Holter monitor 01/2017 800 PACs and 1200 PVCs.  Last saw Dr. Meda Coffee 02/2017 and was doing well.  Comes in today for f/u. Plays tennis 3 days/week, golf 3 days/week.  Denies chest pain, palpitations, dyspnea, dyspnea on exertion, dizziness or presyncope.  Has a fair amount of indigestion and try to come off omeprazole but had significant indigestion.  Is going to make an appointment with Dr. Carlean Purl in the near future.    Past Medical History:  Diagnosis Date  . Adenomatous colon polyp 2001  . Anemia    as a child   . Aortic regurgitation    a. mild by echo 03/2015  . Arthritis   . Cancer (HCC)    hx of skin cancer   . Chest pain 03/31/2015  . Diverticulitis large intestine 04/19/2015  . Diverticulitis of colon - recurrent/persistent 12/29/2014  . Diverticulosis of colon (without mention of hemorrhage) 2005  . GERD (gastroesophageal reflux disease)   . Heart murmur   . History of diverticulitis 12/17/2013  . History of kidney stones   . History of skin cancer   . Hx of Clostridium difficile infection   . Hyperglycemia   . Hyperlipidemia   . Hypertension   . Hypertensive heart disease   . LLQ pain 11/26/2014  . Macular degeneration    left eye  . Orthostasis   . Palpitations 07/17/2016  . Pleurisy   . Prostatitis   . 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 Retinal  hemorrhage   . Shingles   . Small bowel obstruction ([place]) 05/31/2016    Past Surgical History:  Procedure Laterality Date  . CATARACTS REMOVED    . COLONOSCOPY    . COLONOSCOPY N/A 06/15/2014   Procedure: COLONOSCOPY;  Surgeon: Gatha Mayer, MD;  Location: [facility] [place];  Service: Endoscopy;  Laterality: N/A;  . CYSTOSCOPY WITH RETROGRADE PYELOGRAM, URETEROSCOPY AND STENT PLACEMENT Right 10/18/2013   Procedure: CYSTOSCOPY WITH RIGHT RETROGRADE/RIGHT URETEROSCOPY, STONE BASKETRY,  AND STENT PLACEMENT RIGHT;  Surgeon: Alexis Frock, MD;  Location: [facility];  Service: Urology;  Laterality: Right;  . EYE SURGERY     3 injections to left eye for macular degeneration   . FMT  06/2014  . LAPAROSCOPIC LOW ANTERIOR RESECTION N/A 04/19/2015   Procedure: LAPAROSCOPIC ASSISTED  LOW ANTERIOR RESECTION, splenic flexure mobilization;  Surgeon: Fanny Skates, MD;  Location: [facility];  Service: General;  Laterality: N/A;  . LITHOTRIPSY    . TONSILLECTOMY      Current Medications: Current Meds  Medication Sig  . amLODipine (NORVASC) 5 MG tablet Take 1 tablet (5 mg total) by mouth daily.  Marland Kitchen azithromycin (ZITHROMAX) 250 MG tablet Day 1: Take 2 daily.  Days 2 -5: Take 1 daily.  . Cholecalciferol (VITAMIN D3) 2000 UNITS TABS Take 1 tablet by mouth daily.  . Coenzyme Q10 (COQ10 PO)  Take 100 mg by mouth daily.   . fluticasone (FLONASE) 50 MCG/ACT nasal spray Place 1 spray into both nostrils daily as needed for allergies or rhinitis.  . hydroxypropyl methylcellulose / hypromellose (ISOPTO TEARS / GONIOVISC) 2.5 % ophthalmic solution Place 1 drop into both eyes 3 (three) times daily as needed for dry eyes.  Marland Kitchen KRILL OIL PO Take 1 tablet by mouth daily.   . LUTEIN PO Take 25 mg by mouth daily.   . metoprolol succinate (TOPROL-XL) 25 MG 24 hr tablet TAKE 1 TABLET BY MOUTH EVERY DAY  . omeprazole (PRILOSEC) 40 MG capsule TAKE 1 CAPSULE (40 MG TOTAL) BY MOUTH DAILY.  Marland Kitchen OVER THE COUNTER MEDICATION Take 1 capsule by  mouth daily. Cumin  . saccharomyces boulardii (FLORASTOR) 250 MG capsule Take 250 mg by mouth daily.  Marland Kitchen triamterene-hydrochlorothiazide (DYAZIDE) 37.5-25 MG capsule TAKE ONE CAPSULE EVERY DAY     Allergies:   Patient has no known allergies.   Social History   Socioeconomic History  . Marital status: Widowed    Spouse name: Not on file  . Number of children: 1  . Years of education: Not on file  . Highest education level: Not on file  Occupational History  . Occupation: Retired   Scientific laboratory technician  . Financial resource strain: Not on file  . Food insecurity:    Worry: Not on file    Inability: Not on file  . Transportation needs:    Medical: Not on file    Non-medical: Not on file  Tobacco Use  . Smoking status: Never Smoker  . Smokeless tobacco: Never Used  Substance and Sexual Activity  . Alcohol use: No    Alcohol/week: 0.0 oz  . Drug use: No  . Sexual activity: Not on file  Lifestyle  . Physical activity:    Days per week: Not on file    Minutes per session: Not on file  . Stress: Not on file  Relationships  . Social connections:    Talks on phone: Not on file    Gets together: Not on file    Attends religious service: Not on file    Active member of club or organization: Not on file    Attends meetings of clubs or organizations: Not on file    Relationship status: Not on file  Other Topics Concern  . Not on file  Social History Narrative  . Not on file     Family History:  The patient's family history includes Breast cancer in his mother; Colon cancer in his father; Colon polyps in his father; Heart disease in his mother; Kidney disease in his sister.   ROS:   Please see the history of present illness.    Review of Systems  Constitution: Negative.  HENT: Negative.   Cardiovascular: Negative.   Respiratory: Negative.   Endocrine: Negative.   Hematologic/Lymphatic: Negative.   Musculoskeletal: Negative.   Gastrointestinal: Positive for heartburn.    Genitourinary: Negative.   Neurological: Negative.    All other systems reviewed and are negative.   PHYSICAL EXAM:   VS:  BP 124/88   Pulse 71   Ht 5' 11"  (1.803 m)   Wt 175 lb 12.8 oz (79.7 kg)   SpO2 97%   BMI 24.52 kg/m   Physical Exam  GEN: Well nourished, well developed, in no acute distress  Neck: no JVD, carotid bruits, or masses Cardiac:RRR; 1/6 systolic murmur and 1/6 diastolic murmur at the left sternal border Respiratory:  clear to auscultation bilaterally, normal work of breathing GI: soft, nontender, nondistended, + BS Ext: without cyanosis, clubbing, or edema, Good distal pulses bilaterally Neuro:  Alert and Oriented x 3 Psych: euthymic mood, full affect  Wt Readings from Last 3 Encounters:  09/12/17 175 lb 12.8 oz (79.7 kg)  06/18/17 178 lb 3.2 oz (80.8 kg)  03/19/17 174 lb (78.9 kg)      Studies/Labs Reviewed:   EKG:  EKG is not ordered today.    Recent Labs: 12/26/2016: ALT 31; BUN 21; Creat 1.37; Hemoglobin 14.1; Platelets 181; Potassium 3.7; Sodium 140; TSH 0.89   Lipid Panel No results found for: CHOL, TRIG, HDL, CHOLHDL, VLDL, LDLCALC, LDLDIRECT  Additional studies/ records that were reviewed today include:  Exercise nuclear stress test: 04/12/2015  Nuclear stress EF: 61%.  Blood pressure demonstrated a hypertensive response to exercise.  There was no ST segment deviation noted during stress.  The study is normal.  This is a low risk study.  The left ventricular ejection fraction is normal (55-65%).   TTE: 04/12/2015 - Left ventricle: The cavity size was normal. Wall thickness was   increased in a pattern of mild LVH. Systolic function was normal.   The estimated ejection fraction was in the range of 60% to 65%.   Wall motion was normal; there were no regional wall motion   abnormalities. Doppler parameters are consistent with abnormal   left ventricular relaxation (grade 1 diastolic dysfunction). The   E/e&' ratio is <8, suggesting  normal LV filling pressure. - Aortic valve: Trileaflet. Sclerosis without stenosis. There was   mild regurgitation. - Mitral valve: Mildly thickened leaflets . There was trivial   regurgitation. - Left atrium: The atrium was normal in size. - Tricuspid valve: There was trivial regurgitation. - Pulmonary arteries: PA peak pressure: 25 mm Hg (S). - Systemic veins: The IVC Was not visualized.   Impressions: - LVEF 65-70%, mild LVH, normal wall motion, diastolic dysfunction   with normal LV filling pressure, aortic sclerosis with mild AI           ASSESSMENT:    1. Essential hypertension   2. Aortic valve insufficiency, etiology of cardiac valve disease unspecified   3. Palpitations      PLAN:  In order of problems listed above:  Essential hypertension blood pressure well controlled.  Check be met today last creatinine 1.37 October 2018  History of AI last echo in 2017 it was mild.  There are no symptoms and very mild murmur.  Palpitations with PACs and PVCs controlled with metoprolol    Medication Adjustments/Labs and Tests Ordered: Current medicines are reviewed at length with the patient today.  Concerns regarding medicines are outlined above.  Medication changes, Labs and Tests ordered today are listed in the Patient Instructions below. There are no Patient Instructions on file for this visit.   Sumner Boast, PA-C  09/12/2017 10:18 AM    [facility] [address], [place], [place]  [address] Phone: [phone]; Fax: [phone]

## 2017-09-12 ENCOUNTER — Ambulatory Visit: Payer: Medicare Other | Admitting: Physician Assistant

## 2017-09-12 ENCOUNTER — Encounter: Payer: Self-pay | Admitting: Physician Assistant

## 2017-09-12 VITALS — BP 124/88 | HR 71 | Ht 71.0 in | Wt 175.8 lb

## 2017-09-12 DIAGNOSIS — I351 Nonrheumatic aortic (valve) insufficiency: Secondary | ICD-10-CM

## 2017-09-12 DIAGNOSIS — R002 Palpitations: Secondary | ICD-10-CM | POA: Diagnosis not present

## 2017-09-12 DIAGNOSIS — I10 Essential (primary) hypertension: Secondary | ICD-10-CM

## 2017-09-12 LAB — BASIC METABOLIC PANEL
BUN/Creatinine Ratio: 20 (ref 10–24)
BUN: 19 mg/dL (ref 8–27)
CO2: 28 mmol/L (ref 20–29)
CREATININE: 0.94 mg/dL (ref 0.76–1.27)
Calcium: 10.3 mg/dL — ABNORMAL HIGH (ref 8.6–10.2)
Chloride: 99 mmol/L (ref 96–106)
GFR calc Af Amer: 88 mL/min/{1.73_m2} (ref >59)
GFR, EST NON AFRICAN AMERICAN: 76 mL/min/{1.73_m2} (ref >59)
Glucose: 102 mg/dL — ABNORMAL HIGH (ref 65–99)
Potassium: 3.7 mmol/L (ref 3.5–5.2)
SODIUM: 142 mmol/L (ref 134–144)

## 2017-09-12 NOTE — Patient Instructions (Signed)
Medication Instructions: Your physician recommends that you continue on your current medications as directed. Please refer to the Current Medication list given to you today.   Labwork: TODAY: BMET  Procedures/Testing: None  Follow-Up: Your physician recommends that you schedule a follow-up appointment in: 6 months with Dr.Nelson    Any Additional Special Instructions Will Be Listed Below (If Applicable).     If you need a refill on your cardiac medications before your next appointment, please call your pharmacy.

## 2017-09-24 ENCOUNTER — Telehealth: Payer: Self-pay | Admitting: Internal Medicine

## 2017-09-24 NOTE — Telephone Encounter (Signed)
Left message for patient to call back  

## 2017-09-25 NOTE — Telephone Encounter (Signed)
Left message for patient to call back  

## 2017-09-25 NOTE — Telephone Encounter (Signed)
Patient wants to discuss the long term side effects of PPIs.  Patient is scheduled;ed to come in and discuss with Dr. Carlean Purl 12/11/17

## 2017-11-30 ENCOUNTER — Other Ambulatory Visit: Payer: Self-pay | Admitting: Family Medicine

## 2017-11-30 DIAGNOSIS — K219 Gastro-esophageal reflux disease without esophagitis: Secondary | ICD-10-CM

## 2017-12-11 ENCOUNTER — Encounter: Payer: Self-pay | Admitting: Internal Medicine

## 2017-12-11 ENCOUNTER — Ambulatory Visit: Payer: Medicare Other | Admitting: Internal Medicine

## 2017-12-11 VITALS — BP 128/74 | HR 64 | Ht 71.0 in | Wt 175.8 lb

## 2017-12-11 DIAGNOSIS — Z8719 Personal history of other diseases of the digestive system: Secondary | ICD-10-CM | POA: Diagnosis not present

## 2017-12-11 DIAGNOSIS — K5730 Diverticulosis of large intestine without perforation or abscess without bleeding: Secondary | ICD-10-CM | POA: Diagnosis not present

## 2017-12-11 DIAGNOSIS — K219 Gastro-esophageal reflux disease without esophagitis: Secondary | ICD-10-CM | POA: Diagnosis not present

## 2017-12-11 NOTE — Progress Notes (Signed)
Joel Bauer 84 y.o. 02/11/1934 [id number]  Assessment & Plan:   Encounter Diagnoses  Name Primary?  . Gastroesophageal reflux disease, esophagitis presence not specified Yes  . Diverticulosis of colon without hemorrhage   . History of diverticulitis     I think he has a good story for reflux disease, we reviewed the pros and cons of therapy.  He might benefit from tapering PPI and using Pepcid if possible but I do not think the potential risks of PPI outweigh the benefits.  We reviewed how the literature and leg press talks a lot about these side effects such as dementia fractures kidney failure and even death but those are weak associations at best in the research, and mine and many experts opinions is poor.  He is comfortable taking a PPI if he needs to but we will see if he tapers.  Regarding the diverticulosis I reviewed that I do not think his twinges of pain represent diverticulitis, he knows to call us or his primary care provider if he gets persistent symptoms again.  His risk of repeat C. difficile is not necessarily increased now that we replaced his fecal microbiologic with the transplant though judicious use of antibiotics is always appropriate especially in his case.  I appreciate the opportunity to care for this patient.  Cc;[name], Cammie Mcgee, MD    Subjective:   Chief Complaint: Heartburn and PPI therapy, left-sided abdominal pain  HPI Joel Bauer is here with questions about whether or not he can come off omeprazole, he has been somewhat concerned about side effects.  I had seen him in the past due to recurrent C. 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  We have never discussed reflux symptoms but he said for a number of years he has had heartburn problems that he took over-the-counter Pepcid for with incomplete relief and his primary care provider put him on omeprazole 20 mg daily and that has provided excellent relief  of symptoms.  He is never had an endoscopy.  He reports that pork triggers his symptoms but as long is on a PPI it does not cause problems.  When he tries to come off omeprazole within a couple of days he gets severe retrosternal burning/heartburn.  It resolves rapidly with reinstitution of therapy.  There is no unintentional weight loss, dysphagia or bleeding.  He also has an occasional twinge in the side on the left flank and wonders if that could be related to residual diverticulosis that he was surprised to see on a CT scan report.  He has a history of diverticulitis with segmental left colon resection.  The symptoms are not like that and they might last for 10 or 20 minutes at most and are very random and rare.  He does have some concern about possibly taking antibiotics again in the future with respect to his C. difficile issues. No Known Allergies Current Meds  Medication Sig  . amLODipine (NORVASC) 5 MG tablet Take 1 tablet (5 mg total) by mouth daily.  . Cholecalciferol (VITAMIN D3) 2000 UNITS TABS Take 1 tablet by mouth daily.  . Coenzyme Q10 (COQ10 PO) Take 100 mg by mouth daily.   . fluticasone (FLONASE) 50 MCG/ACT nasal spray Place 1 spray into both nostrils daily as needed for allergies or rhinitis.  . hydroxypropyl methylcellulose / hypromellose (ISOPTO TEARS / GONIOVISC) 2.5 % ophthalmic solution Place 1 drop into both eyes 3 (three) times daily as needed for dry  eyes.  . KRILL OIL PO Take 1 tablet by mouth daily.   . LUTEIN PO Take 25 mg by mouth daily.   . metoprolol succinate (TOPROL-XL) 25 MG 24 hr tablet TAKE 1 TABLET BY MOUTH EVERY DAY  . omeprazole (PRILOSEC) 40 MG capsule TAKE 1 CAPSULE (40 MG TOTAL) BY MOUTH DAILY.  Marland Kitchen OVER THE COUNTER MEDICATION Take 1 capsule by mouth daily. Cumin  . saccharomyces boulardii (FLORASTOR) 250 MG capsule Take 250 mg by mouth daily.  Marland Kitchen triamterene-hydrochlorothiazide (DYAZIDE) 37.5-25 MG capsule TAKE ONE CAPSULE EVERY DAY   Past Medical History:   Diagnosis Date  . Adenomatous colon polyp 2001  . Anemia    as a child   . Aortic regurgitation    a. mild by echo 03/2015  . Arthritis   . Cancer (HCC)    hx of skin cancer   . Chest pain 03/31/2015  . Diverticulitis large intestine 04/19/2015  . Diverticulitis of colon - recurrent/persistent 12/29/2014  . Diverticulosis of colon (without mention of hemorrhage) 2005  . GERD (gastroesophageal reflux disease)   . Heart murmur   . History of diverticulitis 12/17/2013  . History of kidney stones   . History of skin cancer   . Hx of Clostridium difficile infection   . Hyperglycemia   . Hyperlipidemia   . Hypertension   . Hypertensive heart disease   . LLQ pain 11/26/2014  . Macular degeneration    left eye  . Orthostasis   . Palpitations 07/17/2016  . Pleurisy   . Prostatitis   . 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 Retinal hemorrhage   . Shingles   . Small bowel obstruction ([place]) 05/31/2016   Past Surgical History:  Procedure Laterality Date  . CATARACTS REMOVED    . COLONOSCOPY    . COLONOSCOPY N/A 06/15/2014   Procedure: COLONOSCOPY;  Surgeon: Gatha Mayer, MD;  Location: [facility];  Service: Endoscopy;  Laterality: N/A;  . CYSTOSCOPY WITH RETROGRADE PYELOGRAM, URETEROSCOPY AND STENT PLACEMENT Right 10/18/2013   Procedure: CYSTOSCOPY WITH RIGHT RETROGRADE/RIGHT URETEROSCOPY, STONE BASKETRY,  AND STENT PLACEMENT RIGHT;  Surgeon: Alexis Frock, MD;  Location: [facility];  Service: Urology;  Laterality: Right;  . EYE SURGERY     3 injections to left eye for macular degeneration   . FMT  06/2014  . LAPAROSCOPIC LOW ANTERIOR RESECTION N/A 04/19/2015   Procedure: LAPAROSCOPIC ASSISTED  LOW ANTERIOR RESECTION, splenic flexure mobilization;  Surgeon: Fanny Skates, MD;  Location: [facility];  Service: General;  Laterality: N/A;  . LITHOTRIPSY    . TONSILLECTOMY     Social History   Social History Narrative   He is retired, widowed and has 1 child.   He does not use  alcohol tobacco or drugs   family history includes Breast cancer in his mother; Colon cancer in his father; Colon polyps in his father; Heart disease in his mother; Kidney disease in his sister.   Review of Systems As per HPI.  He remains active and has not had significant weight changes. Wt Readings from Last 3 Encounters:  12/11/17 175 lb 12.8 oz (79.7 kg)  09/12/17 175 lb 12.8 oz (79.7 kg)  06/18/17 178 lb 3.2 oz (80.8 kg)     Objective:   Physical Exam BP 128/74   Pulse 64   Ht 5\' 11"  (1.803 m)   Wt 175 lb 12.8 oz (79.7 kg)   BMI 24.52 kg/m  NAD Looks a bit younger than stated age Eyes  anicteric Alert and oriented x3 Appropriate mood and affect

## 2017-12-11 NOTE — Patient Instructions (Addendum)
   If you want to come off of the omeprazole try taking it every other day and switch to 20 mg Pepcid twice a day  We gave you a GERD diet just for review tough it sounds like you are doing well with that already.  If you get persistent abdominal pains that seem like diverticulitis - call us.  I appreciate the opportunity to care for you. Gatha Mayer, MD, [facility]

## 2018-01-14 ENCOUNTER — Other Ambulatory Visit: Payer: Self-pay | Admitting: Family Medicine

## 2018-01-14 DIAGNOSIS — R002 Palpitations: Secondary | ICD-10-CM

## 2018-02-20 ENCOUNTER — Encounter: Payer: Self-pay | Admitting: Cardiology

## 2018-02-20 ENCOUNTER — Ambulatory Visit: Payer: Medicare Other | Admitting: Cardiology

## 2018-02-20 VITALS — BP 150/70 | HR 70 | Ht 71.0 in | Wt 179.8 lb

## 2018-02-20 DIAGNOSIS — I119 Hypertensive heart disease without heart failure: Secondary | ICD-10-CM | POA: Diagnosis not present

## 2018-02-20 DIAGNOSIS — R002 Palpitations: Secondary | ICD-10-CM

## 2018-02-20 DIAGNOSIS — I10 Essential (primary) hypertension: Secondary | ICD-10-CM | POA: Diagnosis not present

## 2018-02-20 DIAGNOSIS — I351 Nonrheumatic aortic (valve) insufficiency: Secondary | ICD-10-CM | POA: Diagnosis not present

## 2018-02-20 NOTE — Patient Instructions (Addendum)
Medication Instructions:   Your physician recommends that you continue on your current medications as directed. Please refer to the Current Medication list given to you today.  If you need a refill on your cardiac medications before your next appointment, please call your pharmacy.     Lab work:  IN 6 MONTHS, PRIOR TO YOUR 6 MONTH FOLLOW-UP APPOINTMENT WITH DR NELSON--WILL CHECK CMET, CBC W DIFF, TSH, AND LIPIDS--PLEASE COME FASTING TO THIS LAB APPOINTMENT--YOUR LAB APPOINTMENT IS SCHEDULED FOR August 23, 2018 AT OUR OFFICE  If you have labs (blood work) drawn today and your tests are completely normal, you will receive your results only by: Marland Kitchen MyChart Message (if you have MyChart) OR . A paper copy in the mail If you have any lab test that is abnormal or we need to change your treatment, we will call you to review the results.       Follow-Up: At [facility], you and your health needs are our priority.  As part of our continuing mission to provide you with exceptional heart care, we have created designated Provider Care Teams.  These Care Teams include your primary Cardiologist (physician) and Advanced Practice Providers (APPs -  Physician Assistants and Nurse Practitioners) who all work together to provide you with the care you need, when you need it. You will need a follow up appointment in 6 months.  Please call our office 2 months in advance to schedule this appointment.  You may see Ena Dawley, MD or one of the following Advanced Practice Providers on your designated Care Team:   [place], PA-C Melina Copa, PA-C . Ermalinda Barrios, PA-C

## 2018-02-20 NOTE — Progress Notes (Signed)
Cardiology Office Note    Date:  02/20/2018   ID:  Joel Bauer, DOB [birth year], MRN [id number]  PCP:  Susy Frizzle, MD  Cardiologist: Ena Dawley, MD  No chief complaint on file.   History of Present Illness:  Joel Bauer is a 84 y.o. male with history of hypertension, orthostasis after weight loss from chronic diverticulitis, HLD, atypical chest pain with normal nuclear stress test in 2017, 2D echo 2017 LVEF 60 to 65% with mild LVH, grade 1 DD, aortic sclerosis without stenosis, mild AI.  Also has palpitations and PVCs controlled with metoprolol.  Holter monitor 01/2017 800 PACs and 1200 PVCs.  Last saw Dr. Meda Coffee 02/2017 and was doing well.  Comes in today for f/u. Plays tennis 3 days/week, golf 3 days/week.  Denies chest pain, palpitations, dyspnea, dyspnea on exertion, dizziness or presyncope.  Has a fair amount of indigestion and try to come off omeprazole but had significant indigestion.  Is going to make an appointment with Dr. Carlean Purl in the near future.  02/20/2018 -this is 6 months follow-up, the patient feels and looks great, he continues to play tennis 3 times a week and golf in between.  He denies any chest pain shortness of breath no lower extremity edema orthopnea proximal nocturnal dyspnea.  No recent palpitation since the last visit.   Past Medical History:  Diagnosis Date  . Adenomatous colon polyp 2001  . Anemia    as a child   . Aortic regurgitation    a. mild by echo 03/2015  . Arthritis   . Cancer (HCC)    hx of skin cancer   . Chest pain 03/31/2015  . Diverticulitis large intestine 04/19/2015  . Diverticulitis of colon - recurrent/persistent 12/29/2014  . Diverticulosis of colon (without mention of hemorrhage) 2005  . GERD (gastroesophageal reflux disease)   . Heart murmur   . History of diverticulitis 12/17/2013  . History of kidney stones   . History of skin cancer   . Hx of Clostridium difficile infection   . Hyperglycemia   .  Hyperlipidemia   . Hypertension   . Hypertensive heart disease   . LLQ pain 11/26/2014  . Macular degeneration    left eye  . Orthostasis   . Palpitations 07/17/2016  . Pleurisy   . Prostatitis   . 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 Retinal hemorrhage   . Shingles   . Small bowel obstruction ([place]) 05/31/2016    Past Surgical History:  Procedure Laterality Date  . CATARACTS REMOVED    . COLONOSCOPY    . COLONOSCOPY N/A 06/15/2014   Procedure: COLONOSCOPY;  Surgeon: Gatha Mayer, MD;  Location: [place];  Service: Endoscopy;  Laterality: N/A;  . CYSTOSCOPY WITH RETROGRADE PYELOGRAM, URETEROSCOPY AND STENT PLACEMENT Right 10/18/2013   Procedure: CYSTOSCOPY WITH RIGHT RETROGRADE/RIGHT URETEROSCOPY, STONE BASKETRY,  AND STENT PLACEMENT RIGHT;  Surgeon: Alexis Frock, MD;  Location[facility];  Service: Urology;  Laterality: Right;  . EYE SURGERY     3 injections to left eye for macular degeneration   . FMT  06/2014  . LAPAROSCOPIC LOW ANTERIOR RESECTION N/A 04/19/2015   Procedure: LAPAROSCOPIC ASSISTED  LOW ANTERIOR RESECTION, splenic flexure mobilization;  Surgeon: Fanny Skates, MD;  Location: [facility];  Service: General;  Laterality: N/A;  . LITHOTRIPSY    . TONSILLECTOMY      Current Medications: Current Meds  Medication Sig  . amLODipine (NORVASC) 5 MG tablet Take 1 tablet (  5 mg total) by mouth daily.  . Cholecalciferol (VITAMIN D3) 2000 UNITS TABS Take 1 tablet by mouth daily.  . Coenzyme Q10 (COQ10 PO) Take 100 mg by mouth daily.   . fluticasone (FLONASE) 50 MCG/ACT nasal spray Place 1 spray into both nostrils daily as needed for allergies or rhinitis.  . hydroxypropyl methylcellulose / hypromellose (ISOPTO TEARS / GONIOVISC) 2.5 % ophthalmic solution Place 1 drop into both eyes 3 (three) times daily as needed for dry eyes.  Marland Kitchen KRILL OIL PO Take 1 tablet by mouth daily.   . LUTEIN PO Take 25 mg by mouth daily.   . metoprolol succinate (TOPROL-XL) 25 MG 24 hr  tablet TAKE 1 TABLET BY MOUTH EVERY DAY  . omeprazole (PRILOSEC) 40 MG capsule TAKE 1 CAPSULE (40 MG TOTAL) BY MOUTH DAILY.  Marland Kitchen OVER THE COUNTER MEDICATION Take 1 capsule by mouth daily. Cumin  . saccharomyces boulardii (FLORASTOR) 250 MG capsule Take 250 mg by mouth daily.  Marland Kitchen triamterene-hydrochlorothiazide (DYAZIDE) 37.5-25 MG capsule TAKE ONE CAPSULE EVERY DAY     Allergies:   Patient has no known allergies.   Social History   Socioeconomic History  . Marital status: Widowed    Spouse name: Not on file  . Number of children: 1  . Years of education: Not on file  . Highest education level: Not on file  Occupational History  . Occupation: Retired   Scientific laboratory technician  . Financial resource strain: Not on file  . Food insecurity:    Worry: Not on file    Inability: Not on file  . Transportation needs:    Medical: Not on file    Non-medical: Not on file  Tobacco Use  . Smoking status: Never Smoker  . Smokeless tobacco: Never Used  Substance and Sexual Activity  . Alcohol use: No    Alcohol/week: 0.0 standard drinks  . Drug use: No  . Sexual activity: Not on file  Lifestyle  . Physical activity:    Days per week: Not on file    Minutes per session: Not on file  . Stress: Not on file  Relationships  . Social connections:    Talks on phone: Not on file    Gets together: Not on file    Attends religious service: Not on file    Active member of club or organization: Not on file    Attends meetings of clubs or organizations: Not on file    Relationship status: Not on file  Other Topics Concern  . Not on file  Social History Narrative   He is retired, widowed and has 1 child.   He does not use alcohol tobacco or drugs     Family History:  The patient's family history includes Breast cancer in his mother; Colon cancer in his father; Colon polyps in his father; Heart disease in his mother; Kidney disease in his sister.   ROS:   Please see the history of present illness.      Review of Systems  Constitution: Negative.  HENT: Negative.   Cardiovascular: Negative.   Respiratory: Negative.   Endocrine: Negative.   Hematologic/Lymphatic: Negative.   Musculoskeletal: Negative.   Gastrointestinal: Positive for heartburn.  Genitourinary: Negative.   Neurological: Negative.    All other systems reviewed and are negative.   PHYSICAL EXAM:   VS:  BP (!) 150/70   Pulse 70   Ht 5\' 11"  (1.803 m)   Wt 179 lb 12.8 oz (81.6 kg)   SpO2  93%   BMI 25.08 kg/m   Physical Exam  GEN: Well nourished, well developed, in no acute distress  Neck: no JVD, carotid bruits, or masses Cardiac:RRR; 1/6 systolic murmur and 1/6 diastolic murmur at the left sternal border Respiratory:  clear to auscultation bilaterally, normal work of breathing GI: soft, nontender, nondistended, + BS Ext: without cyanosis, clubbing, or edema, Good distal pulses bilaterally Neuro:  Alert and Oriented x 3 Psych: euthymic mood, full affect  Wt Readings from Last 3 Encounters:  02/20/18 179 lb 12.8 oz (81.6 kg)  12/11/17 175 lb 12.8 oz (79.7 kg)  09/12/17 175 lb 12.8 oz (79.7 kg)    Studies/Labs Reviewed:   EKG:  EKG is  ordered today.   Shows normal sinus rhythm, normal EKG, unchanged from prior.  Personally reviewed.  Recent Labs: 09/12/2017: BUN 19; Creatinine, Ser 0.94; Potassium 3.7; Sodium 142   Lipid Panel No results found for: CHOL, TRIG, HDL, CHOLHDL, VLDL, LDLCALC, LDLDIRECT  Additional studies/ records that were reviewed today include:  Exercise nuclear stress test: 04/12/2015  Nuclear stress EF: 61%.  Blood pressure demonstrated a hypertensive response to exercise.  There was no ST segment deviation noted during stress.  The study is normal.  This is a low risk study.  The left ventricular ejection fraction is normal (55-65%).   TTE: 04/12/2015 - Left ventricle: The cavity size was normal. Wall thickness was   increased in a pattern of mild LVH. Systolic function was  normal.   The estimated ejection fraction was in the range of 60% to 65%.   Wall motion was normal; there were no regional wall motion   abnormalities. Doppler parameters are consistent with abnormal   left ventricular relaxation (grade 1 diastolic dysfunction). The   E/e&' ratio is <8, suggesting normal LV filling pressure. - Aortic valve: Trileaflet. Sclerosis without stenosis. There was   mild regurgitation. - Mitral valve: Mildly thickened leaflets . There was trivial   regurgitation. - Left atrium: The atrium was normal in size. - Tricuspid valve: There was trivial regurgitation. - Pulmonary arteries: PA peak pressure: 25 mm Hg (S). - Systemic veins: The IVC Was not visualized.   Impressions: - LVEF 65-70%, mild LVH, normal wall motion, diastolic dysfunction   with normal LV filling pressure, aortic sclerosis with mild AI    ASSESSMENT:    1. Essential hypertension   2. Hypertensive heart disease without heart failure   3. Palpitations   4. Aortic valve insufficiency, etiology of cardiac valve disease unspecified     PLAN:  In order of problems listed above:  Essential hypertension -recheck blood pressure 138/82, at home in 130s.  We will continue the same management.  History of AI last echo in 2017 it was mild.  There are no symptoms and very mild murmur.  No reason to repeat right now.  Palpitations with PACs and PVCs controlled with metoprolol.  He is completely asymptomatic.  Follow-up in 6 months with labs prior to the appointment.  Medication Adjustments/Labs and Tests Ordered: Current medicines are reviewed at length with the patient today.  Concerns regarding medicines are outlined above.  Medication changes, Labs and Tests ordered today are listed in the Patient Instructions below. Patient Instructions  Medication Instructions:   Your physician recommends that you continue on your current medications as directed. Please refer to the Current Medication list  given to you today.  If you need a refill on your cardiac medications before your next appointment, please call your pharmacy.  Lab work:  IN 6 MONTHS, PRIOR TO YOUR 6 MONTH FOLLOW-UP APPOINTMENT WITH DR Gelila Well--WILL CHECK CMET, CBC W DIFF, TSH, AND LIPIDS--PLEASE COME FASTING TO THIS LAB APPOINTMENT  If you have labs (blood work) drawn today and your tests are completely normal, you will receive your results only by: Marland Kitchen MyChart Message (if you have MyChart) OR . A paper copy in the mail If you have any lab test that is abnormal or we need to change your treatment, we will call you to review the results.       Follow-Up: At [facility], you and your health needs are our priority.  As part of our continuing mission to provide you with exceptional heart care, we have created designated Provider Care Teams.  These Care Teams include your primary Cardiologist (physician) and Advanced Practice Providers (APPs -  Physician Assistants and Nurse Practitioners) who all work together to provide you with the care you need, when you need it. You will need a follow up appointment in 6 months.  Please call our office 2 months in advance to schedule this appointment.  You may see Ena Dawley, MD or one of the following Advanced Practice Providers on your designated Care Team:   [place], PA-C Melina Copa, PA-C . Ermalinda Barrios, PA-C        Signed, Ena Dawley, MD  02/20/2018 10:43 AM    [address], [place], [place]  [address] Phone: [phone]; Fax: [phone]

## 2018-03-28 ENCOUNTER — Other Ambulatory Visit: Payer: Self-pay | Admitting: Cardiology

## 2018-03-29 ENCOUNTER — Other Ambulatory Visit: Payer: Self-pay | Admitting: Cardiology

## 2018-08-21 ENCOUNTER — Telehealth: Payer: Self-pay

## 2018-08-21 ENCOUNTER — Telehealth: Payer: Self-pay | Admitting: *Deleted

## 2018-08-21 NOTE — Telephone Encounter (Signed)
    COVID-19 Pre-Screening Questions:  . In the past 7 to 10 days have you had a cough,  shortness of breath, headache, congestion, fever (100 or greater) body aches, chills, sore throat, or sudden loss of taste or sense of smell? . Have you been around anyone with known Covid 19. . Have you been around anyone who is awaiting Covid 19 test results in the past 7 to 10 days? . Have you been around anyone who has been exposed to Covid 19, or has mentioned symptoms of Covid 19 within the past 7 to 10 days?  If you have any concerns/questions about symptoms patients report during screening (either on the phone or at threshold). Contact the provider seeing the patient or DOD for further guidance.  If neither are available contact a member of the leadership team.          No answer left message for labs  klb 08/21/2018 1043

## 2018-08-21 NOTE — Telephone Encounter (Signed)
    COVID-19 Pre-Screening Questions:  . In the past 7 to 10 days have you had a cough,  shortness of breath, headache, congestion, fever (100 or greater) body aches, chills, sore throat, or sudden loss of taste or sense of smell? . Have you been around anyone with known Covid 19. . Have you been around anyone who is awaiting Covid 19 test results in the past 7 to 10 days? . Have you been around anyone who has been exposed to Covid 19, or has mentioned symptoms of Covid 19 within the past 7 to 10 days?  If you have any concerns/questions about symptoms patients report during screening (either on the phone or at threshold). Contact the provider seeing the patient or DOD for further guidance.  If neither are available contact a member of the leadership team.            pt answered NO all pre-screening questions Kb 08/21/18

## 2018-08-22 ENCOUNTER — Telehealth: Payer: Self-pay | Admitting: *Deleted

## 2018-08-22 NOTE — Telephone Encounter (Signed)
    COVID-19 Pre-Screening Questions:  . In the past 7 to 10 days have you had a cough,  shortness of breath, headache, congestion, fever (100 or greater) body aches, chills, sore throat, or sudden loss of taste or sense of smell? . Have you been around anyone with known Covid 19. . Have you been around anyone who is awaiting Covid 19 test results in the past 7 to 10 days? . Have you been around anyone who has been exposed to Covid 19, or has mentioned symptoms of Covid 19 within the past 7 to 10 days?  If you have any concerns/questions about symptoms patients report during screening (either on the phone or at threshold). Contact the provider seeing the patient or DOD for further guidance.  If neither are available contact a member of the leadership team.           Contacted patient via telephone. No to all Covid 19 questions. Has a mask.  KB

## 2018-08-23 ENCOUNTER — Other Ambulatory Visit: Payer: Medicare Other | Admitting: *Deleted

## 2018-08-23 ENCOUNTER — Other Ambulatory Visit: Payer: Self-pay

## 2018-08-23 DIAGNOSIS — R002 Palpitations: Secondary | ICD-10-CM

## 2018-08-23 DIAGNOSIS — I10 Essential (primary) hypertension: Secondary | ICD-10-CM

## 2018-08-23 DIAGNOSIS — I351 Nonrheumatic aortic (valve) insufficiency: Secondary | ICD-10-CM

## 2018-08-23 DIAGNOSIS — I119 Hypertensive heart disease without heart failure: Secondary | ICD-10-CM

## 2018-08-24 LAB — CBC WITH DIFFERENTIAL/PLATELET
Basophils Absolute: 0.1 10*3/uL (ref 0.0–0.2)
Basos: 1 %
EOS (ABSOLUTE): 0.2 10*3/uL (ref 0.0–0.4)
Eos: 4 %
Hematocrit: 44.1 % (ref 37.5–51.0)
Hemoglobin: 15.1 g/dL (ref 13.0–17.7)
Immature Grans (Abs): 0 10*3/uL (ref 0.0–0.1)
Immature Granulocytes: 1 %
Lymphocytes Absolute: 1.6 10*3/uL (ref 0.7–3.1)
Lymphs: 29 %
MCH: 30.8 pg (ref 26.6–33.0)
MCHC: 34.2 g/dL (ref 31.5–35.7)
MCV: 90 fL (ref 79–97)
Monocytes Absolute: 0.5 10*3/uL (ref 0.1–0.9)
Monocytes: 9 %
Neutrophils Absolute: 3.1 10*3/uL (ref 1.4–7.0)
Neutrophils: 56 %
Platelets: 164 10*3/uL (ref 150–450)
RBC: 4.91 x10E6/uL (ref 4.14–5.80)
RDW: 13.8 % (ref 11.6–15.4)
WBC: 5.5 10*3/uL (ref 3.4–10.8)

## 2018-08-24 LAB — COMPREHENSIVE METABOLIC PANEL
ALT: 36 IU/L (ref 0–44)
AST: 26 IU/L (ref 0–40)
Albumin/Globulin Ratio: 2.2 (ref 1.2–2.2)
Albumin: 4.4 g/dL (ref 3.6–4.6)
Alkaline Phosphatase: 62 IU/L (ref 39–117)
BUN/Creatinine Ratio: 16 (ref 10–24)
BUN: 19 mg/dL (ref 8–27)
Bilirubin Total: 0.7 mg/dL (ref 0.0–1.2)
CO2: 26 mmol/L (ref 20–29)
Calcium: 9.4 mg/dL (ref 8.6–10.2)
Chloride: 101 mmol/L (ref 96–106)
Creatinine, Ser: 1.18 mg/dL (ref 0.76–1.27)
GFR calc Af Amer: 66 mL/min/{1.73_m2} (ref >59)
GFR calc non Af Amer: 57 mL/min/{1.73_m2} — ABNORMAL LOW (ref >59)
Globulin, Total: 2 g/dL (ref 1.5–4.5)
Glucose: 98 mg/dL (ref 65–99)
Potassium: 3.6 mmol/L (ref 3.5–5.2)
Sodium: 143 mmol/L (ref 134–144)
Total Protein: 6.4 g/dL (ref 6.0–8.5)

## 2018-08-24 LAB — LIPID PANEL
Chol/HDL Ratio: 4.8 ratio (ref 0.0–5.0)
Cholesterol, Total: 195 mg/dL (ref 100–199)
HDL: 41 mg/dL (ref >39)
LDL Calculated: 108 mg/dL — ABNORMAL HIGH (ref 0–99)
Triglycerides: 230 mg/dL — ABNORMAL HIGH (ref 0–149)
VLDL Cholesterol Cal: 46 mg/dL — ABNORMAL HIGH (ref 5–40)

## 2018-08-24 LAB — TSH: TSH: 1.23 u[IU]/mL (ref 0.450–4.500)

## 2018-08-28 ENCOUNTER — Telehealth: Payer: Self-pay

## 2018-08-28 NOTE — Telephone Encounter (Signed)
LMTCB

## 2018-08-28 NOTE — Telephone Encounter (Signed)
-----   Message from Dorothy Spark, MD sent at 08/26/2018  9:16 AM EDT ----- All labs normal except for elevated LDL and TG, I would suggest a low dose rosuvastatin 5 mg po daily if he is willing to start. If yes, I would repeat LFTs and lipids in 1 month.

## 2018-08-30 ENCOUNTER — Telehealth: Payer: Self-pay | Admitting: Cardiology

## 2018-08-30 DIAGNOSIS — E785 Hyperlipidemia, unspecified: Secondary | ICD-10-CM

## 2018-08-30 DIAGNOSIS — E782 Mixed hyperlipidemia: Secondary | ICD-10-CM

## 2018-08-30 DIAGNOSIS — E7800 Pure hypercholesterolemia, unspecified: Secondary | ICD-10-CM

## 2018-08-30 MED ORDER — ROSUVASTATIN CALCIUM 5 MG PO TABS: 5.0000 mg | ORAL_TABLET | Freq: Every day | ORAL | 1 refills | Status: DC

## 2018-08-30 NOTE — Telephone Encounter (Signed)
Patient states he is returning nurses call.

## 2018-08-30 NOTE — Telephone Encounter (Signed)
Attempted to call the pt back x 3 on both contact numbers listed.  Left him a VM to call back.

## 2018-08-30 NOTE — Telephone Encounter (Signed)
Follow up  ° ° °Pt is returning call  ° ° °Please call back  °

## 2018-08-30 NOTE — Telephone Encounter (Signed)
Spoke with the pt and informed him that per Dr Meda Coffee, all his labs were normal but his LDL is elevated as well as his TG.  Informed the pt that Dr Meda Coffee recommends that he start low dose rosuvastatin 5 mg po daily and come in for fasting lipids and LFTs in one month.  Confirmed the pharmacy of choice with the pt. Scheduled the pt a lab appt for repeat lipids and LFTs in one month on 10/03/18 at 0915-NOTE TO KEEP LAB APPT.  Pt is aware to come fasting to this lab appt. Pt verbalized understanding and agrees with this plan.

## 2018-08-30 NOTE — Telephone Encounter (Signed)
-----   Message from Dorothy Spark, MD sent at 08/26/2018  9:16 AM EDT ----- All labs normal except for elevated LDL and TG, I would suggest a low dose rosuvastatin 5 mg po daily if he is willing to start. If yes, I would repeat LFTs and lipids in 1 month.

## 2018-09-05 ENCOUNTER — Encounter: Payer: Self-pay | Admitting: *Deleted

## 2018-09-09 ENCOUNTER — Encounter: Payer: Self-pay | Admitting: Physician Assistant

## 2018-09-09 NOTE — Progress Notes (Signed)
Virtual Visit via Telephone Note   This visit type was conducted due to national recommendations for restrictions regarding the COVID-19 Pandemic (e.g. social distancing) in an effort to limit this patient's exposure and mitigate transmission in our community.  Due to his co-morbid illnesses, this patient is at least at moderate risk for complications without adequate follow up.  This format is felt to be most appropriate for this patient at this time.  The patient did not have access to video technology/had technical difficulties with video requiring transitioning to audio format only (telephone).  All issues noted in this document were discussed and addressed.  No physical exam could be performed with this format.  Please refer to the patient's chart for his  consent to telehealth for [facility].   Date:  09/12/2018   ID:  Theodoro Parma, DOB 24-Jul-1933, MRN [id number]  Patient Location: Home Provider Location: Home  PCP:  Susy Frizzle, MD  Cardiologist:  Ena Dawley, MD  Electrophysiologist:  None   Evaluation Performed:  Follow-Up Visit  Chief Complaint:  F/u HTN, palpitations, discuss plan for lipids  History of Present Illness:    DEBBIE BELLUCCI is a 85 y.o. male with HTN, hypertensive heart disease, PACs/PVCs, orthostasis following weight loss after laparoscopic low anterior resection for chronic diverticulitis, mild AI by echo 2017, hyperlipidemia, arthritis, retinal hemorrhage, macular degeneration who presents for f/u.   To recap, he saw Dr. Meda Coffee in the past for atypical chest pain with nuc 03/2015 that was normal except for hypertensive response to exercise. 2D Echo 03/2015 showed EF 60-65%, mild LVH, grade 1 DD with normal filling pressure, aortic sclerosis without stenosis, mild AI. He has had previous medicine adjustment for palpitations/orthostasis, also in the context of taking hydrochlorothiazide medication for Meniere's disease. Holter in 01/2017 showed  sinus bradycardia to sinus rhythm, frequent PACs (<1%) and PVCs (1%), 1 run of atrial tach (4 beats). Labs 08/23/18 showed triglycerides 230, LDL 108, normal TSH, normal CBC, K 3.6, Cr 1.18 (similar to prior), normal LFTs. Based on these labs, Dr. Meda Coffee started Crestor 5mg  daily with plan to repeat labs in mid-July.  The patient is seen back virtually and overall is doing well. He is still playing tennis regularly (now that courts have reopened with social distancing measures) and denies any CP, SOB, or syncope. His palpitations are well controlled and extremely rare. He usually checks his BP at home but doesn't have his cuff with him. He plans to check it this afternoon to let us know how it is running. He does report that a few days after starting the Crestor he noticed a symptom that usually indicates he is having prostate issues (urinary hesitancy). This resolved after 3 days of going off the Crestor. He has since restarted and thinks there might be a re-emergence but is not totally sure. The patient does not have symptoms concerning for COVID-19 infection (fever, chills, cough, or new shortness of breath).    Past Medical History:  Diagnosis Date   Adenomatous colon polyp 2001   Anemia    as a child    Aortic regurgitation    a. mild by echo 03/2015   Arthritis    Cancer ([place])    hx of skin cancer    Chest pain 03/31/2015   Diverticulitis large intestine 04/19/2015   Diverticulitis of colon - recurrent/persistent 12/29/2014   Diverticulosis of colon (without mention of hemorrhage) 2005   GERD (gastroesophageal reflux disease)    History of  kidney stones    History of skin cancer    Hyperlipidemia    Hypertension    Hypertensive heart disease    LLQ pain 11/26/2014   Macular degeneration    left eye   Orthostasis    Pleurisy    Premature atrial contractions    Prostatitis    PVC's (premature ventricular contractions)    Recurrent colitis due to Clostridium  difficile    Retinal hemorrhage    Shingles    Small bowel obstruction ([place]) 05/31/2016   Past Surgical History:  Procedure Laterality Date   CATARACTS REMOVED     COLONOSCOPY     COLONOSCOPY N/A 06/15/2014   Procedure: COLONOSCOPY;  Surgeon: Gatha Mayer, MD;  Location: [place];  Service: Endoscopy;  Laterality: N/A;   CYSTOSCOPY WITH RETROGRADE PYELOGRAM, URETEROSCOPY AND STENT PLACEMENT Right 10/18/2013   Procedure: CYSTOSCOPY WITH RIGHT RETROGRADE/RIGHT URETEROSCOPY, STONE BASKETRY,  AND STENT PLACEMENT RIGHT;  Surgeon: Alexis Frock, MD;  [facility];  Service: Urology;  Laterality: Right;   EYE SURGERY     3 injections to left eye for macular degeneration    FMT  06/2014   LAPAROSCOPIC LOW ANTERIOR RESECTION N/A 04/19/2015   Procedure: LAPAROSCOPIC ASSISTED  LOW ANTERIOR RESECTION, splenic flexure mobilization;  Surgeon: Fanny Skates, MD;  Location[facility];  Service: General;  Laterality: N/A;   LITHOTRIPSY     TONSILLECTOMY       Current Meds  Medication Sig   amLODipine (NORVASC) 5 MG tablet TAKE 1 TABLET BY MOUTH EVERY DAY   Cholecalciferol (VITAMIN D3) 2000 UNITS TABS Take 1 tablet by mouth daily.   Coenzyme Q10 (COQ10 PO) Take 100 mg by mouth daily.    fluticasone (FLONASE) 50 MCG/ACT nasal spray Place 1 spray into both nostrils daily as needed for allergies or rhinitis.   hydroxypropyl methylcellulose / hypromellose (ISOPTO TEARS / GONIOVISC) 2.5 % ophthalmic solution Place 1 drop into both eyes 3 (three) times daily as needed for dry eyes.   KRILL OIL PO Take 1 tablet by mouth daily.    LUTEIN PO Take 25 mg by mouth daily.    metoprolol succinate (TOPROL-XL) 25 MG 24 hr tablet TAKE 1 TABLET BY MOUTH EVERY DAY   omeprazole (PRILOSEC) 40 MG capsule TAKE 1 CAPSULE (40 MG TOTAL) BY MOUTH DAILY.   rosuvastatin (CRESTOR) 5 MG tablet Take 1 tablet (5 mg total) by mouth daily.   triamterene-hydrochlorothiazide (DYAZIDE) 37.5-25 MG capsule TAKE  ONE CAPSULE EVERY DAY     Allergies:   Patient has no known allergies.   Social History   Tobacco Use   Smoking status: Never Smoker   Smokeless tobacco: Never Used  Substance Use Topics   Alcohol use: No    Alcohol/week: 0.0 standard drinks   Drug use: No     Family Hx: The patient's family history includes Breast cancer in his mother; Colon cancer in his father; Colon polyps in his father; Heart disease in his mother; Kidney disease in his sister.  ROS:   Please see the history of present illness.    All other systems reviewed and are negative.   Prior CV studies:    Most recent pertinent cardiac studies are outlined above.  Labs/Other Tests and Data Reviewed:    EKG:  An ECG dated 02/20/18 was personally reviewed today and demonstrated:  NSR 71bpm no acute STT changes  Recent Labs: 08/23/2018: ALT 36; BUN 19; Creatinine, Ser 1.18; Hemoglobin 15.1; Platelets 164; Potassium 3.6; Sodium  143; TSH 1.230   Recent Lipid Panel Lab Results  Component Value Date/Time   CHOL 195 08/23/2018 09:32 AM   TRIG 230 (H) 08/23/2018 09:32 AM   HDL 41 08/23/2018 09:32 AM   CHOLHDL 4.8 08/23/2018 09:32 AM   LDLCALC 108 (H) 08/23/2018 09:32 AM    Wt Readings from Last 3 Encounters:  09/12/18 170 lb (77.1 kg)  02/20/18 179 lb 12.8 oz (81.6 kg)  12/11/17 175 lb 12.8 oz (79.7 kg)     Objective:    Vital Signs:  Ht 5\' 11"  (1.803 m)    Wt 170 lb (77.1 kg)    BMI 23.71 kg/m    VS reviewed. General - pleasant male in no acute distress Pulm - No labored breathing, no coughing during visit, no audible wheezing, speaking in full sentences Neuro - A+Ox3, no slurred speech, answers questions appropriately Psych - Pleasant affect    ASSESSMENT & PLAN:    1. Essential HTN with hypertensive heart disease (mild LVH) - clinically doing well. Needs BP assessment. He will check it this afternoon and let us know results. Need to keep in mind h/o orthostasis as well. No symptoms of this  recently. 2. Hyperlipidemia - I cannot specifically find prostate issues to be a known AE of Crestor but it sounds like there was a temporal relationship. I told patient he could try taking Crestor 3x a week and see how he does. He will keep [place] updated. Keep lab appt in July to see where he's at. 3. Mild aortic insufficiency - no symptoms to suggest progression. In setting of Covid pandemic and asymptomatic nature, would not repeat surveillance at this time but should be considered before 2022 (5 yrs after original study). 4. PACs/PVCs - symptoms well controlled on beta blocker. Continue present regimen.  COVID-19 Education: The signs and symptoms of COVID-19 were discussed with the patient and how to seek care for testing (follow up with PCP or arrange E-visit).  The importance of social distancing was discussed today.  Time:   Today, I have spent 16 minutes with the patient with telehealth technology discussing the above problems.    Medication Adjustments/Labs and Tests Ordered: Current medicines are reviewed at length with the patient today.  Concerns regarding medicines are outlined above.   Disposition:  Follow up in 6 months with Dr. Meda Coffee. She has been following him regularly at this interval. I suspect he can likely be moved out to yearly at her discretion.  Signed, Charlie Pitter, PA-C  09/12/2018 8:34 AM    [place]

## 2018-09-11 ENCOUNTER — Telehealth: Payer: Self-pay | Admitting: Physician Assistant

## 2018-09-11 NOTE — Telephone Encounter (Signed)
New Message ° ° ° ° °CONSENT FOR TELE-HEALTH VISIT - PLEASE REVIEW TO PATIENT ° °I hereby voluntarily request, consent and authorize [facility] and its employed or contracted physicians, physician assistants, nurse practitioners or other licensed health care professionals (the Practitioner), to provide me with telemedicine health care services (the "Services") as deemed necessary by the treating Practitioner. I acknowledge and consent to receive the Services by the Practitioner via telemedicine. I understand that the telemedicine visit will involve communicating with the Practitioner through live audiovisual communication technology and the disclosure of certain medical information by electronic transmission. I acknowledge that I have been given the opportunity to request an in-person assessment or other available alternative prior to the telemedicine visit and am voluntarily participating in the telemedicine visit. ° °I understand that I have the right to withhold or withdraw my consent to the use of telemedicine in the course of my care at any time, without affecting my right to future care or treatment, and that the Practitioner or I may terminate the ° °telemedicine visit at any time. I understand that I have the right to inspect all information obtained and/or recorded in the course of the telemedicine visit and may receive copies of available information for a reasonable fee. I understand that some of the potential risks of receiving the Services via telemedicine include: ° °Delay or interruption in medical evaluation due to technological equipment failure or disruption; ° °Information transmitted may not be sufficient (e.g. poor resolution of images) to allow for appropriate medical decision making by the Practitioner; and/or ° °In rare instances, security protocols could fail, causing a breach of personal health information. ° °Furthermore, I acknowledge that it is my responsibility to provide  information about my medical history, conditions and care that is complete and accurate to the best of my ability. I acknowledge that Practitioner's advice, recommendations, and/or decision may be based on factors not within their control, such as incomplete or inaccurate data provided by me or distortions of diagnostic images or specimens that may result from electronic transmissions. I understand that the practice of medicine is not an exact science and that Practitioner makes no warranties or guarantees regarding treatment outcomes. I acknowledge that I will receive a copy of this consent concurrently upon execution via email to the email address I last provided but may also request a printed copy by calling the office of [facility]. ° °I understand that my insurance will be billed for this visit. ° °I have read or had this consent read to me. ° °I understand the contents of this consent, which adequately explains the benefits and risks of the Services being provided via telemedicine. ° °I have been provided ample opportunity to ask questions regarding this consent and the Services and have had my questions answered to my satisfaction. ° °I give my informed consent for the services to be provided through the use of telemedicine in my medical care ° °By participating in this telemedicine visit I agree to the above °YES °

## 2018-09-12 ENCOUNTER — Other Ambulatory Visit: Payer: Self-pay

## 2018-09-12 ENCOUNTER — Telehealth (INDEPENDENT_AMBULATORY_CARE_PROVIDER_SITE_OTHER): Payer: Medicare Other | Admitting: Physician Assistant

## 2018-09-12 ENCOUNTER — Encounter: Payer: Self-pay | Admitting: Physician Assistant

## 2018-09-12 VITALS — Ht 71.0 in | Wt 170.0 lb

## 2018-09-12 DIAGNOSIS — E782 Mixed hyperlipidemia: Secondary | ICD-10-CM

## 2018-09-12 DIAGNOSIS — I491 Atrial premature depolarization: Secondary | ICD-10-CM

## 2018-09-12 DIAGNOSIS — I10 Essential (primary) hypertension: Secondary | ICD-10-CM | POA: Diagnosis not present

## 2018-09-12 DIAGNOSIS — I351 Nonrheumatic aortic (valve) insufficiency: Secondary | ICD-10-CM | POA: Diagnosis not present

## 2018-09-12 DIAGNOSIS — I493 Ventricular premature depolarization: Secondary | ICD-10-CM

## 2018-09-12 DIAGNOSIS — I119 Hypertensive heart disease without heart failure: Secondary | ICD-10-CM

## 2018-09-12 MED ORDER — ROSUVASTATIN CALCIUM 5 MG PO TABS: 5.0000 mg | ORAL_TABLET | ORAL | 3 refills | Status: DC

## 2018-09-12 NOTE — Patient Instructions (Signed)
Medication Instructions:  Your physician has recommended you make the following change in your medication:  1.  REDUCE the Crestor to taking only 3 times a week  If you need a refill on your cardiac medications before your next appointment, please call your pharmacy.   Lab work: [employer] YOUR APPT IN July FOR LABS July 16.  REMEMBER TO COME FASTING  If you have labs (blood work) drawn today and your tests are completely normal, you will receive your results only by: Marland Kitchen MyChart Message (if you have MyChart) OR . A paper copy in the mail If you have any lab test that is abnormal or we need to change your treatment, we will call you to review the results.  Testing/Procedures: None ordered  Follow-Up: At [facility], you and your health needs are our priority.  As part of our continuing mission to provide you with exceptional heart care, we have created designated Provider Care Teams.  These Care Teams include your primary Cardiologist (physician) and Advanced Practice Providers (APPs -  Physician Assistants and Nurse Practitioners) who all work together to provide you with the care you need, when you need it. You will need a follow up appointment in 6 months.  Please call our office 2 months in advance to schedule this appointment.  You may see Ena Dawley, MD or one of the following Advanced Practice Providers on your designated Care Team:   [place], PA-C Melina Copa, PA-C . Ermalinda Barrios, PA-C  Any Other Special Instructions Will Be Listed Below (If Applicable). To check your blood pressure, remain seated in a chair for 5 minutes quietly beforehand, then check it. If you are able to check your heart rate too, that would be great. When you get a cuff, please get those readings and call us/send in MyChart message with them for our review. Please include a comment to route to Melina Copa (in case you weren't able to select my name from the drop-down list).

## 2018-09-14 ENCOUNTER — Other Ambulatory Visit: Payer: Self-pay | Admitting: Family Medicine

## 2018-09-14 DIAGNOSIS — K219 Gastro-esophageal reflux disease without esophagitis: Secondary | ICD-10-CM

## 2018-10-03 ENCOUNTER — Other Ambulatory Visit: Payer: Medicare Other | Admitting: *Deleted

## 2018-10-03 ENCOUNTER — Other Ambulatory Visit: Payer: Self-pay

## 2018-10-03 DIAGNOSIS — E7800 Pure hypercholesterolemia, unspecified: Secondary | ICD-10-CM

## 2018-10-03 DIAGNOSIS — E785 Hyperlipidemia, unspecified: Secondary | ICD-10-CM

## 2018-10-03 DIAGNOSIS — E782 Mixed hyperlipidemia: Secondary | ICD-10-CM

## 2018-10-03 LAB — HEPATIC FUNCTION PANEL
ALT: 37 IU/L (ref 0–44)
AST: 29 IU/L (ref 0–40)
Albumin: 4.5 g/dL (ref 3.6–4.6)
Alkaline Phosphatase: 66 IU/L (ref 39–117)
Bilirubin Total: 0.7 mg/dL (ref 0.0–1.2)
Bilirubin, Direct: 0.25 mg/dL (ref 0.00–0.40)
Total Protein: 6.2 g/dL (ref 6.0–8.5)

## 2018-10-03 LAB — LIPID PANEL
Chol/HDL Ratio: 3.2 ratio (ref 0.0–5.0)
Cholesterol, Total: 131 mg/dL (ref 100–199)
HDL: 41 mg/dL (ref >39)
LDL Calculated: 60 mg/dL (ref 0–99)
Triglycerides: 152 mg/dL — ABNORMAL HIGH (ref 0–149)
VLDL Cholesterol Cal: 30 mg/dL (ref 5–40)

## 2018-10-30 ENCOUNTER — Other Ambulatory Visit: Payer: Self-pay | Admitting: Cardiology

## 2018-10-30 DIAGNOSIS — E782 Mixed hyperlipidemia: Secondary | ICD-10-CM

## 2018-10-30 DIAGNOSIS — E7800 Pure hypercholesterolemia, unspecified: Secondary | ICD-10-CM

## 2018-11-02 ENCOUNTER — Other Ambulatory Visit: Payer: Self-pay | Admitting: Cardiology

## 2018-11-06 ENCOUNTER — Other Ambulatory Visit: Payer: Self-pay | Admitting: Cardiology

## 2018-11-06 ENCOUNTER — Other Ambulatory Visit: Payer: Self-pay | Admitting: Physician Assistant

## 2018-11-06 MED ORDER — ROSUVASTATIN CALCIUM 5 MG PO TABS: 5.0000 mg | ORAL_TABLET | ORAL | 3 refills | Status: DC

## 2018-11-13 ENCOUNTER — Other Ambulatory Visit: Payer: Self-pay | Admitting: Dermatology

## 2018-11-13 DIAGNOSIS — C4492 Squamous cell carcinoma of skin, unspecified: Secondary | ICD-10-CM

## 2018-11-13 HISTORY — DX: Squamous cell carcinoma of skin, unspecified: C44.92

## 2018-12-15 ENCOUNTER — Other Ambulatory Visit: Payer: Self-pay | Admitting: Family Medicine

## 2018-12-15 DIAGNOSIS — K219 Gastro-esophageal reflux disease without esophagitis: Secondary | ICD-10-CM

## 2019-01-12 ENCOUNTER — Other Ambulatory Visit: Payer: Self-pay | Admitting: Family Medicine

## 2019-01-12 DIAGNOSIS — R002 Palpitations: Secondary | ICD-10-CM

## 2019-03-12 ENCOUNTER — Other Ambulatory Visit: Payer: Self-pay | Admitting: Family Medicine

## 2019-03-12 DIAGNOSIS — K219 Gastro-esophageal reflux disease without esophagitis: Secondary | ICD-10-CM

## 2019-03-25 ENCOUNTER — Other Ambulatory Visit: Payer: Self-pay | Admitting: Cardiology

## 2019-03-27 ENCOUNTER — Encounter: Payer: Self-pay | Admitting: Family Medicine

## 2019-03-27 ENCOUNTER — Other Ambulatory Visit: Payer: Self-pay

## 2019-03-27 ENCOUNTER — Ambulatory Visit: Payer: Medicare Other | Admitting: Family Medicine

## 2019-03-27 VITALS — BP 128/64 | HR 64 | Temp 96.8°F | Resp 12 | Ht 71.0 in | Wt 178.0 lb

## 2019-03-27 DIAGNOSIS — Z23 Encounter for immunization: Secondary | ICD-10-CM

## 2019-03-27 DIAGNOSIS — I10 Essential (primary) hypertension: Secondary | ICD-10-CM | POA: Diagnosis not present

## 2019-03-27 MED ORDER — TRAZODONE HCL 50 MG PO TABS: 25.0000 mg | ORAL_TABLET | Freq: Every evening | ORAL | 3 refills | Status: DC | PRN

## 2019-03-28 LAB — COMPLETE METABOLIC PANEL WITH GFR
AG Ratio: 2 (calc) (ref 1.0–2.5)
ALT: 33 U/L (ref 9–46)
AST: 25 U/L (ref 10–35)
Albumin: 4.2 g/dL (ref 3.6–5.1)
Alkaline phosphatase (APISO): 56 U/L (ref 35–144)
BUN/Creatinine Ratio: 19 (calc) (ref 6–22)
BUN: 23 mg/dL (ref 7–25)
CO2: 31 mmol/L (ref 20–32)
Calcium: 9.8 mg/dL (ref 8.6–10.3)
Chloride: 101 mmol/L (ref 98–110)
Creat: 1.18 mg/dL — ABNORMAL HIGH (ref 0.70–1.11)
GFR, Est African American: 66 mL/min/{1.73_m2} (ref >=60)
GFR, Est Non African American: 57 mL/min/{1.73_m2} — ABNORMAL LOW (ref >=60)
Globulin: 2.1 g/dL (calc) (ref 1.9–3.7)
Glucose, Bld: 103 mg/dL — ABNORMAL HIGH (ref 65–99)
Potassium: 3.8 mmol/L (ref 3.5–5.3)
Sodium: 140 mmol/L (ref 135–146)
Total Bilirubin: 0.7 mg/dL (ref 0.2–1.2)
Total Protein: 6.3 g/dL (ref 6.1–8.1)

## 2019-03-28 NOTE — Progress Notes (Signed)
Subjective:    Patient ID: Joel Bauer, male    DOB: [birth year], 86 y.o.   MRN: [id number]  Patient is here today for a checkup.  I have not seen the patient since 2018.  Past medical history is significant for partial colectomy secondary to recurrent diverticulitis, mild aortic regurgitation, and hypertension.  Overall the patient states that he has done well since I last saw him.  He really has no complaints.  He has had his flu shot this year.  He has had Pneumovax 23 in the past.  He has not had Prevnar 13 for which he is due.  He denies any chest pain shortness of breath or dyspnea on exertion.  Given age she is not due for prostate cancer screening.  He denies any ongoing issues with memory loss, falls, depression.  Patient saw cardiology earlier this year.  They had lab work checked in the summer which showed an excellent fasting lipid panel.  He is overdue for electrolytes to monitor his renal function and potassium on his hypertension medication.  Blood pressure today is well controlled at 128/64.   Past Medical History:  Diagnosis Date  . Adenomatous colon polyp 2001  . Anemia    as a child   . Aortic regurgitation    a. mild by echo 03/2015  . Arthritis   . Cancer ([facility])    hx of skin cancer   . Chest pain 03/31/2015  . Diverticulitis large intestine 04/19/2015  . Diverticulitis of colon - recurrent/persistent 12/29/2014  . Diverticulosis of colon (without mention of hemorrhage) 2005  . GERD (gastroesophageal reflux disease)   . History of kidney stones   . History of skin cancer   . Hyperlipidemia   . Hypertension   . Hypertensive heart disease   . LLQ pain 11/26/2014  . Macular degeneration    left eye  . Orthostasis   . Pleurisy   . Premature atrial contractions   . Prostatitis   . PVC's (premature ventricular contractions)   . 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 Retinal hemorrhage   . Shingles   . Small bowel obstruction ([place]) 05/31/2016     Past Surgical History:  Procedure Laterality Date  . CATARACTS REMOVED    . COLONOSCOPY    . COLONOSCOPY N/A 06/15/2014   Procedure: COLONOSCOPY;  Surgeon: Gatha Mayer, MD;  Location: [facility];  Service: Endoscopy;  Laterality: N/A;  . CYSTOSCOPY WITH RETROGRADE PYELOGRAM, URETEROSCOPY AND STENT PLACEMENT Right 10/18/2013   Procedure: CYSTOSCOPY WITH RIGHT RETROGRADE/RIGHT URETEROSCOPY, STONE BASKETRY,  AND STENT PLACEMENT RIGHT;  Surgeon: Alexis Frock, MD;  [facility];  Service: Urology;  Laterality: Right;  . EYE SURGERY     3 injections to left eye for macular degeneration   . FMT  06/2014  . LAPAROSCOPIC LOW ANTERIOR RESECTION N/A 04/19/2015   Procedure: LAPAROSCOPIC ASSISTED  LOW ANTERIOR RESECTION, splenic flexure mobilization;  Surgeon: Fanny Skates, MD;  [facility];  Service: General;  Laterality: N/A;  . LITHOTRIPSY    . TONSILLECTOMY     Current Outpatient Medications on File Prior to Visit  Medication Sig Dispense Refill  . amLODipine (NORVASC) 5 MG tablet TAKE 1 TABLET BY MOUTH EVERY DAY 90 tablet 1  . Cholecalciferol (VITAMIN D3) 2000 UNITS TABS Take 1 tablet by mouth daily.    . Coenzyme Q10 (COQ10 PO) Take 100 mg by mouth daily.     . fluticasone (FLONASE)  50 MCG/ACT nasal spray Place 1 spray into both nostrils daily as needed for allergies or rhinitis.    . hydroxypropyl methylcellulose / hypromellose (ISOPTO TEARS / GONIOVISC) 2.5 % ophthalmic solution Place 1 drop into both eyes 3 (three) times daily as needed for dry eyes.    Marland Kitchen KRILL OIL PO Take 1 tablet by mouth daily.     . LUTEIN PO Take 25 mg by mouth daily.     . metoprolol succinate (TOPROL-XL) 25 MG 24 hr tablet TAKE 1 TABLET BY MOUTH EVERY DAY 90 tablet 3  . omeprazole (PRILOSEC) 40 MG capsule TAKE 1 CAPSULE BY MOUTH EVERY DAY NEED OFFICE VISIT 90 capsule 0  . rosuvastatin (CRESTOR) 5 MG tablet Take 1 tablet (5 mg total) by mouth 3 (three) times a week. 45 tablet 3  .  triamterene-hydrochlorothiazide (DYAZIDE) 37.5-25 MG capsule TAKE ONE CAPSULE EVERY DAY 90 capsule 3   No current facility-administered medications on file prior to visit.   No Known Allergies Social History   Socioeconomic History  . Marital status: Widowed    Spouse name: Not on file  . Number of children: 1  . Years of education: Not on file  . Highest education level: Not on file  Occupational History  . Occupation: Retired   Tobacco Use  . Smoking status: Never Smoker  . Smokeless tobacco: Never Used  Substance and Sexual Activity  . Alcohol use: No    Alcohol/week: 0.0 standard drinks  . Drug use: No  . Sexual activity: Not on file  Other Topics Concern  . Not on file  Social History Narrative   He is retired, widowed and has 1 child.   He does not use alcohol tobacco or drugs   Social Determinants of Health   Financial Resource Strain:   . Difficulty of Paying Living Expenses: Not on file  Food Insecurity:   . Worried About Charity fundraiser in the Last Year: Not on file  . Ran Out of Food in the Last Year: Not on file  Transportation Needs:   . Lack of Transportation (Medical): Not on file  . Lack of Transportation (Non-Medical): Not on file  Physical Activity:   . Days of Exercise per Week: Not on file  . Minutes of Exercise per Session: Not on file  Stress:   . Feeling of Stress : Not on file  Social Connections:   . Frequency of Communication with Friends and Family: Not on file  . Frequency of Social Gatherings with Friends and Family: Not on file  . Attends Religious Services: Not on file  . Active Member of Clubs or Organizations: Not on file  . Attends Archivist Meetings: Not on file  . Marital Status: Not on file  Intimate Partner Violence:   . Fear of Current or Ex-Partner: Not on file  . Emotionally Abused: Not on file  . Physically Abused: Not on file  . Sexually Abused: Not on file      Review of Systems  All other systems  reviewed and are negative.      Objective:   Physical Exam  Constitutional: He appears well-developed and well-nourished. No distress.  Eyes: Pupils are equal, round, and reactive to light. Conjunctivae are normal.  Neck: No JVD present.  Cardiovascular: Normal rate, regular rhythm, normal heart sounds and intact distal pulses.  No murmur heard. Pulmonary/Chest: Effort normal and breath sounds normal. No respiratory distress. He has no wheezes. He has no rales.  Abdominal: Soft. Bowel sounds are normal. He exhibits no distension and no mass. There is no abdominal tenderness. There is no rebound and no guarding.  Musculoskeletal:        General: No edema.     Cervical back: Neck supple.  Lymphadenopathy:    He has no cervical adenopathy.  Skin: He is not diaphoretic.  Vitals reviewed.         Assessment & Plan:  Benign essential HTN - Plan: COMPLETE METABOLIC PANEL WITH GFR  Need for prophylactic vaccination against Streptococcus pneumoniae (pneumococcus) - Plan: Pneumococcal conjugate vaccine 13-valent  Patient received Prevnar 13 today.  Flu shot is up-to-date.  Physical exam today is normal.  Blood pressures well controlled at 128/64.  Review of systems is negative.  Regular anticipatory guidance is provided.

## 2019-03-29 ENCOUNTER — Other Ambulatory Visit: Payer: Self-pay | Admitting: Family Medicine

## 2019-03-29 DIAGNOSIS — K219 Gastro-esophageal reflux disease without esophagitis: Secondary | ICD-10-CM

## 2019-04-10 ENCOUNTER — Ambulatory Visit: Payer: Medicare Other | Attending: Internal Medicine

## 2019-04-10 DIAGNOSIS — Z23 Encounter for immunization: Secondary | ICD-10-CM | POA: Insufficient documentation

## 2019-04-10 NOTE — Progress Notes (Signed)
   Covid-19 Vaccination Clinic  Name:  Joel Bauer    MRN: [id number] DOB: 1933-12-29  04/10/2019  [name] was observed post Covid-19 immunization for 15 minutes without incidence. He was provided with Vaccine Information Sheet and instruction to access the V-Safe system.   [name] was instructed to call 911 with any severe reactions post vaccine: Marland Kitchen Difficulty breathing  . Swelling of your face and throat  . A fast heartbeat  . A bad rash all over your body  . Dizziness and weakness    Immunizations Administered    Name Date Dose VIS Date Route   Pfizer COVID-19 Vaccine 04/10/2019  1:21 PM 0.3 mL 02/28/2019 Intramuscular   Manufacturer: [place]   Lot: [id number]   [place]: [id number]

## 2019-04-24 ENCOUNTER — Other Ambulatory Visit: Payer: Self-pay | Admitting: Cardiology

## 2019-04-24 ENCOUNTER — Encounter: Payer: Self-pay | Admitting: Cardiology

## 2019-04-24 ENCOUNTER — Ambulatory Visit: Payer: Medicare Other | Admitting: Cardiology

## 2019-04-24 ENCOUNTER — Other Ambulatory Visit: Payer: Self-pay

## 2019-04-24 VITALS — BP 130/72 | HR 67 | Ht 71.0 in | Wt 176.2 lb

## 2019-04-24 DIAGNOSIS — I10 Essential (primary) hypertension: Secondary | ICD-10-CM

## 2019-04-24 DIAGNOSIS — E782 Mixed hyperlipidemia: Secondary | ICD-10-CM

## 2019-04-24 DIAGNOSIS — I351 Nonrheumatic aortic (valve) insufficiency: Secondary | ICD-10-CM

## 2019-04-24 DIAGNOSIS — I493 Ventricular premature depolarization: Secondary | ICD-10-CM | POA: Diagnosis not present

## 2019-04-24 NOTE — Progress Notes (Addendum)
Cardiology Office Note    Date:  04/24/2019   ID:  Joel Bauer, DOB 02-29-1934, MRN [id number]  PCP:  Susy Frizzle, MD  Cardiologist: Ena Dawley, MD  Reason for visit: 6 months follow up  History of Present Illness:  Joel Bauer is a 86 y.o. male with history of hypertension, orthostasis after weight loss from chronic diverticulitis, HLD, atypical chest pain with normal nuclear stress test in 2017, 2D echo 2017 LVEF 60 to 65% with mild LVH, grade 1 DD, aortic sclerosis without stenosis, mild AI.  Also has palpitations and PVCs controlled with metoprolol.  Holter monitor 01/2017 800 PACs and 1200 PVCs.  Last saw Dr. Meda Coffee 02/2017 and was doing well.  Comes in today for f/u. Plays tennis 3 days/week, golf 3 days/week.  Denies chest pain, palpitations, dyspnea, dyspnea on exertion, dizziness or presyncope.  Has a fair amount of indigestion and try to come off omeprazole but had significant indigestion.  Is going to make an appointment with Dr. Carlean Purl in the near future.  09/12/2018 - The patient is seen back virtually and overall is doing well. He is still playing tennis regularly (now that courts have reopened with social distancing measures) and denies any CP, SOB, or syncope. His palpitations are well controlled and extremely rare. He usually checks his BP at home but doesn't have his cuff with him. He plans to check it this afternoon to let us know how it is running. He does report that a few days after starting the Crestor he noticed a symptom that usually indicates he is having prostate issues (urinary hesitancy). This resolved after 3 days of going off the Crestor. He has since restarted and thinks there might be a re-emergence but is not totally sure. The patient does not have symptoms concerning for COVID-19 infection (fever, chills, cough, or new shortness of breath).   04/24/2019 -patient is coming after 6 months, he is looking and feeling great, he continues to walk for  45 minutes daily, sometimes he would feel slightly short of breath on incline or when walking hills otherwise no chest pressure, no palpitation dizziness syncope or falls.  He denies any claudication or lower extremity edema.  He has noticed some cramping in his calves at night.  Past Medical History:  Diagnosis Date  . Adenomatous colon polyp 2001  . Anemia    as a child   . Aortic regurgitation    a. mild by echo 03/2015  . Arthritis   . Cancer (HCC)    hx of skin cancer   . Chest pain 03/31/2015  . Diverticulitis large intestine 04/19/2015  . Diverticulitis of colon - recurrent/persistent 12/29/2014  . Diverticulosis of colon (without mention of hemorrhage) 2005  . GERD (gastroesophageal reflux disease)   . History of kidney stones   . History of skin cancer   . Hyperlipidemia   . Hypertension   . Hypertensive heart disease   . LLQ pain 11/26/2014  . Macular degeneration    left eye  . Orthostasis   . Pleurisy   . Premature atrial contractions   . Prostatitis   . PVC's (premature ventricular contractions)   . 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 Retinal hemorrhage   . Shingles   . Small bowel obstruction ([place]) 05/31/2016    Past Surgical History:  Procedure Laterality Date  . CATARACTS REMOVED    . COLONOSCOPY    . COLONOSCOPY N/A 06/15/2014   Procedure: COLONOSCOPY;  Surgeon: Gatha Mayer, MD;  Location: [facility] ENDOSCOPY;  Service: Endoscopy;  Laterality: N/A;  . CYSTOSCOPY WITH RETROGRADE PYELOGRAM, URETEROSCOPY AND STENT PLACEMENT Right 10/18/2013   Procedure: CYSTOSCOPY WITH RIGHT RETROGRADE/RIGHT URETEROSCOPY, STONE BASKETRY,  AND STENT PLACEMENT RIGHT;  Surgeon: Alexis Frock, MD;  Location: [facility] ORS;  Service: Urology;  Laterality: Right;  . EYE SURGERY     3 injections to left eye for macular degeneration   . FMT  06/2014  . LAPAROSCOPIC LOW ANTERIOR RESECTION N/A 04/19/2015   Procedure: LAPAROSCOPIC ASSISTED  LOW ANTERIOR RESECTION, splenic flexure  mobilization;  Surgeon: Fanny Skates, MD;  Location: [facility] ORS;  Service: General;  Laterality: N/A;  . LITHOTRIPSY    . TONSILLECTOMY     Current Medications: Current Meds  Medication Sig  . amLODipine (NORVASC) 5 MG tablet TAKE 1 TABLET BY MOUTH EVERY DAY  . Cholecalciferol (VITAMIN D3) 2000 UNITS TABS Take 1 tablet by mouth daily.  . Coenzyme Q10 (COQ10 PO) Take 100 mg by mouth daily.   . fluticasone (FLONASE) 50 MCG/ACT nasal spray Place 1 spray into both nostrils daily as needed for allergies or rhinitis.  . hydroxypropyl methylcellulose / hypromellose (ISOPTO TEARS / GONIOVISC) 2.5 % ophthalmic solution Place 1 drop into both eyes 3 (three) times daily as needed for dry eyes.  Marland Kitchen KRILL OIL PO Take 1 tablet by mouth daily.   . LUTEIN PO Take 25 mg by mouth daily.   . metoprolol succinate (TOPROL-XL) 25 MG 24 hr tablet TAKE 1 TABLET BY MOUTH EVERY DAY  . omeprazole (PRILOSEC) 40 MG capsule TAKE 1 CAPSULE BY MOUTH EVERY DAY NEED OFFICE VISIT  . rosuvastatin (CRESTOR) 5 MG tablet Take 1 tablet (5 mg total) by mouth 3 (three) times a week.  . traZODone (DESYREL) 50 MG tablet Take 0.5-1 tablets (25-50 mg total) by mouth at bedtime as needed for sleep.  . [DISCONTINUED] triamterene-hydrochlorothiazide (DYAZIDE) 37.5-25 MG capsule TAKE ONE CAPSULE EVERY DAY     Allergies:   Patient has no known allergies.   Social History   Socioeconomic History  . Marital status: Widowed    Spouse name: Not on file  . Number of children: 1  . Years of education: Not on file  . Highest education level: Not on file  Occupational History  . Occupation: Retired   Tobacco Use  . Smoking status: Never Smoker  . Smokeless tobacco: Never Used  Substance and Sexual Activity  . Alcohol use: No    Alcohol/week: 0.0 standard drinks  . Drug use: No  . Sexual activity: Not on file  Other Topics Concern  . Not on file  Social History Narrative   He is retired, widowed and has 1 child.   He does not use  alcohol tobacco or drugs   Social Determinants of Health   Financial Resource Strain:   . Difficulty of Paying Living Expenses: Not on file  Food Insecurity:   . Worried About Charity fundraiser in the Last Year: Not on file  . Ran Out of Food in the Last Year: Not on file  Transportation Needs:   . Lack of Transportation (Medical): Not on file  . Lack of Transportation (Non-Medical): Not on file  Physical Activity:   . Days of Exercise per Week: Not on file  . Minutes of Exercise per Session: Not on file  Stress:   . Feeling of Stress : Not on file  Social Connections:   . Frequency of Communication with Friends  and Family: Not on file  . Frequency of Social Gatherings with Friends and Family: Not on file  . Attends Religious Services: Not on file  . Active Member of Clubs or Organizations: Not on file  . Attends Archivist Meetings: Not on file  . Marital Status: Not on file     Family History:  The patient's family history includes Breast cancer in his mother; Colon cancer in his father; Colon polyps in his father; Heart disease in his mother; Kidney disease in his sister.   ROS:   Please see the history of present illness.    Review of Systems  Constitution: Negative.  HENT: Negative.   Cardiovascular: Negative.   Respiratory: Negative.   Endocrine: Negative.   Hematologic/Lymphatic: Negative.   Musculoskeletal: Negative.   Gastrointestinal: Positive for heartburn.  Genitourinary: Negative.   Neurological: Negative.    All other systems reviewed and are negative.  PHYSICAL EXAM:   VS:  BP 130/72   Pulse 67   Ht 5\' 11"  (1.803 m)   Wt 176 lb 3.2 oz (79.9 kg)   SpO2 96%   BMI 24.57 kg/m   Physical Exam  GEN: Well nourished, well developed, in no acute distress  Neck: no JVD, carotid bruits, or masses Cardiac:RRR; 1/6 systolic murmur and 2/6 diastolic murmur at the left sternal border Respiratory:  clear to auscultation bilaterally, normal work of  breathing GI: soft, nontender, nondistended, + BS Ext: without cyanosis, clubbing, or edema, Good distal pulses bilaterally Neuro:  Alert and Oriented x 3 Psych: euthymic mood, full affect  Wt Readings from Last 3 Encounters:  04/24/19 176 lb 3.2 oz (79.9 kg)  03/27/19 178 lb (80.7 kg)  09/12/18 170 lb (77.1 kg)    Studies/Labs Reviewed:   EKG:  EKG is  ordered today.   Shows normal sinus rhythm, normal EKG, unchanged from prior.  Personally reviewed.  Recent Labs: 08/23/2018: Hemoglobin 15.1; Platelets 164; TSH 1.230 03/27/2019: ALT 33; BUN 23; Creat 1.18; Potassium 3.8; Sodium 140   Lipid Panel    Component Value Date/Time   CHOL 131 10/03/2018 0000   TRIG 152 (H) 10/03/2018 0000   HDL 41 10/03/2018 0000   CHOLHDL 3.2 10/03/2018 0000   LDLCALC 60 10/03/2018 0000    Additional studies/ records that were reviewed today include:  Exercise nuclear stress test: 04/12/2015  Nuclear stress EF: 61%.  Blood pressure demonstrated a hypertensive response to exercise.  There was no ST segment deviation noted during stress.  The study is normal.  This is a low risk study.  The left ventricular ejection fraction is normal (55-65%).   TTE: 04/12/2015 - Left ventricle: The cavity size was normal. Wall thickness was   increased in a pattern of mild LVH. Systolic function was normal.   The estimated ejection fraction was in the range of 60% to 65%.   Wall motion was normal; there were no regional wall motion   abnormalities. Doppler parameters are consistent with abnormal   left ventricular relaxation (grade 1 diastolic dysfunction). The   E/e&' ratio is <8, suggesting normal LV filling pressure. - Aortic valve: Trileaflet. Sclerosis without stenosis. There was   mild regurgitation. - Mitral valve: Mildly thickened leaflets . There was trivial   regurgitation. - Left atrium: The atrium was normal in size. - Tricuspid valve: There was trivial regurgitation. - Pulmonary arteries: PA  peak pressure: 25 mm Hg (S). - Systemic veins: The IVC Was not visualized.   Impressions: - LVEF 65-70%,  mild LVH, normal wall motion, diastolic dysfunction   with normal LV filling pressure, aortic sclerosis with mild AI    ASSESSMENT:    1. Aortic valve insufficiency, etiology of cardiac valve disease unspecified   2. Frequent PVCs   3. Mixed hyperlipidemia   4. Essential hypertension     PLAN:  In order of problems listed above:  1. Essential HTN with hypertensive heart disease (mild LVH) - BP controlled on current regimen. 2. Hyperlipidemia - he has some nocturnal cramping, he is advised to try taking Magnesium at night, if no improvement hold crestor for a week, if that helps, we will switch to a different statin. 3. Mild aortic insufficiency - no symptoms, mild murmur, we will repeat echocardiogram as he hasn't had one in 4 years. 4. PACs/PVCs - symptoms well controlled on beta blocker. Continue present regimen.  5. DOE - only when walking uphill, still able to walk for 45 minutes daily, ECG normal.   Medication Adjustments/Labs and Tests Ordered: Current medicines are reviewed at length with the patient today.  Concerns regarding medicines are outlined above.  Medication changes, Labs and Tests ordered today are listed in the Patient Instructions below. Patient Instructions  Medication Instructions:  1) HOLD CRESTOR for 1 to 2 weeks. If your cramping improves, call us and we will make medication changes. If not, resume your medication. 2) You may try OTC magnesium for your cramping. *If you need a refill on your cardiac medications before your next appointment, please call your pharmacy*  Testing/Procedures: Your provider has requested that you have an echocardiogram. Echocardiography is a painless test that uses sound waves to create images of your heart. It provides your doctor with information about the size and shape of your heart and how well your heart's chambers and  valves are working. This procedure takes approximately one hour. There are no restrictions for this procedure.  Follow-Up: At [facility], you and your health needs are our priority.  As part of our continuing mission to provide you with exceptional heart care, we have created designated Provider Care Teams.  These Care Teams include your primary Cardiologist (physician) and Advanced Practice Providers (APPs -  Physician Assistants and Nurse Practitioners) who all work together to provide you with the care you need, when you need it. Your next appointment:   6 month(s) The format for your next appointment:   In Person Provider:   You may see Ena Dawley, MD or one of the following Advanced Practice Providers on your designated Care Team:    Melina Copa, PA-C  Ermalinda Barrios, PA-C    Signed, Ena Dawley, MD  04/24/2019 10:[address], [place], [place]  [address] Phone: [phone]; Fax: [phone]

## 2019-04-24 NOTE — Patient Instructions (Signed)
Medication Instructions:  1) HOLD CRESTOR for 1 to 2 weeks. If your cramping improves, call us and we will make medication changes. If not, resume your medication. 2) You may try OTC magnesium for your cramping. *If you need a refill on your cardiac medications before your next appointment, please call your pharmacy*  Testing/Procedures: Your provider has requested that you have an echocardiogram. Echocardiography is a painless test that uses sound waves to create images of your heart. It provides your doctor with information about the size and shape of your heart and how well your heart's chambers and valves are working. This procedure takes approximately one hour. There are no restrictions for this procedure.  Follow-Up: At [facility], you and your health needs are our priority.  As part of our continuing mission to provide you with exceptional heart care, we have created designated Provider Care Teams.  These Care Teams include your primary Cardiologist (physician) and Advanced Practice Providers (APPs -  Physician Assistants and Nurse Practitioners) who all work together to provide you with the care you need, when you need it. Your next appointment:   6 month(s) The format for your next appointment:   In Person Provider:   You may see Ena Dawley, MD or one of the following Advanced Practice Providers on your designated Care Team:    Melina Copa, PA-C  Ermalinda Barrios, PA-C

## 2019-05-01 ENCOUNTER — Ambulatory Visit: Payer: Medicare Other | Attending: Internal Medicine

## 2019-05-01 DIAGNOSIS — Z23 Encounter for immunization: Secondary | ICD-10-CM

## 2019-05-01 NOTE — Progress Notes (Signed)
   Covid-19 Vaccination Clinic  Name:  Joel Bauer    MRN: [id number] DOB: 1933-04-07  05/01/2019  Mr. Kimbro was observed post Covid-19 immunization for 15 minutes without incidence. He was provided with Vaccine Information Sheet and instruction to access the V-Safe system.   Mr. Heaster was instructed to call 911 with any severe reactions post vaccine: Marland Kitchen Difficulty breathing  . Swelling of your face and throat  . A fast heartbeat  . A bad rash all over your body  . Dizziness and weakness    Immunizations Administered    Name Date Dose VIS Date Route   Pfizer COVID-19 Vaccine 05/01/2019  1:06 PM 0.3 mL 02/28/2019 Intramuscular   Manufacturer: [place]   Lot: [id number]   [place]: [id number]

## 2019-05-07 ENCOUNTER — Other Ambulatory Visit (HOSPITAL_COMMUNITY): Payer: Medicare Other

## 2019-06-04 ENCOUNTER — Other Ambulatory Visit: Payer: Self-pay

## 2019-06-04 ENCOUNTER — Ambulatory Visit (HOSPITAL_COMMUNITY): Payer: Medicare Other | Attending: Internal Medicine

## 2019-06-04 DIAGNOSIS — I351 Nonrheumatic aortic (valve) insufficiency: Secondary | ICD-10-CM

## 2019-06-06 ENCOUNTER — Telehealth: Payer: Self-pay | Admitting: Cardiology

## 2019-06-06 NOTE — Telephone Encounter (Signed)
The patient has been notified of the Echo result and verbalized understanding.  All questions (if any) were answered. Neddie Dinardi, RN 06/06/2019 1:14 PM

## 2019-06-06 NOTE — Telephone Encounter (Signed)
Attempted phone call to pt.  Left voicemail message for pt to contact RN at [phone]. 

## 2019-06-06 NOTE — Telephone Encounter (Signed)
Patient states he is returning Ivy's call, requesting his echocardiogram results. Please call to discuss.

## 2019-06-09 ENCOUNTER — Encounter: Payer: Self-pay | Admitting: *Deleted

## 2019-06-09 NOTE — Telephone Encounter (Signed)
Result Notes   Michae Kava [facility]  06/09/2019 2:54 PM EDT    Left message to call for his test results. This has been 3rd attempt our office haas made to reach the pt to go over results. I will send out letter to call the office for results.    Nuala Alpha, LPN  [phone] [phone] PM EDT    Left message for the pt to call back for results. 2nd attempt at trying to make contact with the pt.   Nuala Alpha, LPN  [id number] PM EDT    Left message for the pt to call back for results.   Dorothy Spark, MD  06/04/2019 12:00 PM EDT    LVEF REMAINS NORMAL, AORTIC REGURGITATION STILL M

## 2019-06-13 NOTE — Telephone Encounter (Signed)
Joel Bauer is calling back due to receiving the letter to call our office for his lab results. He states he has already called back and discussed his results with a gentleman before receiving the letter.

## 2019-07-31 MED ORDER — PRAVASTATIN SODIUM 20 MG PO TABS: 20.0000 mg | ORAL_TABLET | Freq: Every evening | ORAL | 1 refills | Status: DC

## 2019-07-31 NOTE — Telephone Encounter (Signed)
Please start pravastatin 20 mg po daily and let us know if you have side effects

## 2019-07-31 NOTE — Telephone Encounter (Signed)
Dorothy Spark, MD  You 11 minutes ago (1:09 PM)     Please start pravastatin 20 mg po daily and let us know if you have side effects      Informed the pt via mychart that given his symptoms improved with discontinuation of Crestor, Dr. Meda Coffee would like for him to stop this indefinitely, and start him on Pravastatin 20 mg po daily, and send [place] a message in mychart if symptoms occur with this regimen.  Informed the pt via mychart that I will send in only a months supply of this med to his pharmacy, to make sure he tolerates this appropriately.  Advised him to reach out to [place] if any side effects occur, and when he needs future refills of pravastatin.  Advised the pt to call the office back with any questions or concerns regarding this.   Updated Crestor in the pts chart as an intolerance, causing leg cramps. I

## 2019-09-19 ENCOUNTER — Other Ambulatory Visit: Payer: Self-pay | Admitting: Cardiology

## 2019-12-02 ENCOUNTER — Other Ambulatory Visit: Payer: Self-pay

## 2019-12-02 ENCOUNTER — Ambulatory Visit: Payer: Medicare Other | Admitting: Dermatology

## 2019-12-02 ENCOUNTER — Encounter: Payer: Self-pay | Admitting: Dermatology

## 2019-12-02 DIAGNOSIS — Z1283 Encounter for screening for malignant neoplasm of skin: Secondary | ICD-10-CM | POA: Diagnosis not present

## 2019-12-02 DIAGNOSIS — H6093 Unspecified otitis externa, bilateral: Secondary | ICD-10-CM

## 2019-12-02 MED ORDER — MUPIROCIN CALCIUM 2 % EX CREA: 1.0000 "application " | TOPICAL_CREAM | Freq: Two times a day (BID) | CUTANEOUS | 1 refills | Status: DC

## 2019-12-02 NOTE — Progress Notes (Signed)
LN2 at behind left ear x 1 LN2 at right temple x 1 LN2 at forehead x 2

## 2019-12-02 NOTE — Progress Notes (Signed)
   Follow-Up Visit   Subjective  Joel Bauer is a 86 y.o. male who presents for the following: Annual Exam (NO CONCERNS).  General skin examination Location:  Duration:  Quality:  Associated Signs/Symptoms: Modifying Factors:  Severity:  Timing: Context:   Objective  Well appearing patient in no apparent distress; mood and affect are within normal limits.  A full examination was performed including scalp, head, eyes, ears, nose, lips, neck, chest, axillae, abdomen, back, buttocks, bilateral upper extremities, bilateral lower extremities, hands, feet, fingers, toes, fingernails, and toenails. All findings within normal limits unless otherwise noted below.   Assessment & Plan    Skin exam for malignant neoplasm (2) Left Abdomen (side) - Upper; Mid Back  Annual skin examination  Otitis externa of both ears, unspecified chronicity, unspecified type (2) Left Cymba; Right Cymba  We will try topical mupirocin twice daily as soon as sores occur.  Should this fail, he will call me and we will try using a topical anti-inflammatory.  1 addendum to visit. Mr. Lacinda Axon showed me some small crusts on his distal ear canal which historically wax and wane. I would like him to try mupirocin ointment twice daily as soon is one of the little crusts appears; he may use this for up to 10 days. If this does not help, we will assume there is a primary eczema and switch to an eczema cream.   I, Lavonna Monarch, MD, have reviewed all documentation for this visit.  The documentation on 12/28/19 for the exam, diagnosis, procedures, and orders are all accurate and complete.

## 2019-12-28 ENCOUNTER — Encounter: Payer: Self-pay | Admitting: Dermatology

## 2020-01-08 ENCOUNTER — Encounter: Payer: Self-pay | Admitting: Cardiology

## 2020-01-08 ENCOUNTER — Ambulatory Visit: Payer: Medicare Other | Admitting: Cardiology

## 2020-01-08 ENCOUNTER — Other Ambulatory Visit: Payer: Self-pay

## 2020-01-08 VITALS — BP 138/76 | HR 70 | Ht 71.0 in | Wt 180.2 lb

## 2020-01-08 DIAGNOSIS — I10 Essential (primary) hypertension: Secondary | ICD-10-CM

## 2020-01-08 DIAGNOSIS — I351 Nonrheumatic aortic (valve) insufficiency: Secondary | ICD-10-CM

## 2020-01-08 DIAGNOSIS — I493 Ventricular premature depolarization: Secondary | ICD-10-CM

## 2020-01-08 DIAGNOSIS — E785 Hyperlipidemia, unspecified: Secondary | ICD-10-CM | POA: Diagnosis not present

## 2020-01-08 DIAGNOSIS — I491 Atrial premature depolarization: Secondary | ICD-10-CM

## 2020-01-08 LAB — LIPID PANEL
Chol/HDL Ratio: 5 ratio (ref 0.0–5.0)
Cholesterol, Total: 200 mg/dL — ABNORMAL HIGH (ref 100–199)
HDL: 40 mg/dL (ref >39)
LDL Chol Calc (NIH): 126 mg/dL — ABNORMAL HIGH (ref 0–99)
Triglycerides: 193 mg/dL — ABNORMAL HIGH (ref 0–149)
VLDL Cholesterol Cal: 34 mg/dL (ref 5–40)

## 2020-01-08 LAB — COMPREHENSIVE METABOLIC PANEL
ALT: 39 IU/L (ref 0–44)
AST: 28 IU/L (ref 0–40)
Albumin/Globulin Ratio: 2 (ref 1.2–2.2)
Albumin: 4.3 g/dL (ref 3.6–4.6)
Alkaline Phosphatase: 68 IU/L (ref 44–121)
BUN/Creatinine Ratio: 16 (ref 10–24)
BUN: 16 mg/dL (ref 8–27)
Bilirubin Total: 0.5 mg/dL (ref 0.0–1.2)
CO2: 28 mmol/L (ref 20–29)
Calcium: 9.2 mg/dL (ref 8.6–10.2)
Chloride: 99 mmol/L (ref 96–106)
Creatinine, Ser: 0.99 mg/dL (ref 0.76–1.27)
GFR calc Af Amer: 81 mL/min/{1.73_m2} (ref >59)
GFR calc non Af Amer: 70 mL/min/{1.73_m2} (ref >59)
Globulin, Total: 2.1 g/dL (ref 1.5–4.5)
Glucose: 93 mg/dL (ref 65–99)
Potassium: 4 mmol/L (ref 3.5–5.2)
Sodium: 139 mmol/L (ref 134–144)
Total Protein: 6.4 g/dL (ref 6.0–8.5)

## 2020-01-08 LAB — CBC WITH DIFFERENTIAL/PLATELET
Basophils Absolute: 0.1 10*3/uL (ref 0.0–0.2)
Basos: 2 %
EOS (ABSOLUTE): 0.2 10*3/uL (ref 0.0–0.4)
Eos: 3 %
Hematocrit: 44.9 % (ref 37.5–51.0)
Hemoglobin: 15 g/dL (ref 13.0–17.7)
Immature Grans (Abs): 0 10*3/uL (ref 0.0–0.1)
Immature Granulocytes: 0 %
Lymphocytes Absolute: 1.2 10*3/uL (ref 0.7–3.1)
Lymphs: 24 %
MCH: 30.4 pg (ref 26.6–33.0)
MCHC: 33.4 g/dL (ref 31.5–35.7)
MCV: 91 fL (ref 79–97)
Monocytes Absolute: 0.4 10*3/uL (ref 0.1–0.9)
Monocytes: 8 %
Neutrophils Absolute: 3.2 10*3/uL (ref 1.4–7.0)
Neutrophils: 63 %
Platelets: 163 10*3/uL (ref 150–450)
RBC: 4.94 x10E6/uL (ref 4.14–5.80)
RDW: 14.4 % (ref 11.6–15.4)
WBC: 5 10*3/uL (ref 3.4–10.8)

## 2020-01-08 LAB — TSH: TSH: 0.617 u[IU]/mL (ref 0.450–4.500)

## 2020-01-08 NOTE — Patient Instructions (Signed)
Medication Instructions:  Your provider recommends that you continue on your current medications as directed. Please refer to the Current Medication list given to you today.   *If you need a refill on your cardiac medications before your next appointment, please call your pharmacy*  Lab Work: TODAY! Lipids, CBC, TSH, CMET If you have labs (blood work) drawn today and your tests are completely normal, you will receive your results only by: Marland Kitchen MyChart Message (if you have MyChart) OR . A paper copy in the mail If you have any lab test that is abnormal or we need to change your treatment, we will call you to review the results.  Follow-Up: At [facility], you and your health needs are our priority.  As part of our continuing mission to provide you with exceptional heart care, we have created designated Provider Care Teams.  These Care Teams include your primary Cardiologist (physician) and Advanced Practice Providers (APPs -  Physician Assistants and Nurse Practitioners) who all work together to provide you with the care you need, when you need it. Your next appointment:   12 month(s) The format for your next appointment:   In Person Provider:   You may see Ena Dawley, MD or one of the following Advanced Practice Providers on your designated Care Team:    Melina Copa, PA-C  Ermalinda Barrios, PA-C

## 2020-01-08 NOTE — Progress Notes (Signed)
Cardiology Office Note    Date:  01/08/2020   ID:  Joel Bauer, DOB [birth year], MRN [id number]  PCP:  Susy Frizzle, MD  Cardiologist: Ena Dawley, MD  Reason for visit: 6 months follow up  History of Present Illness:  Joel Bauer is a 86 y.o. male with history of hypertension, HLD, atypical chest pain with normal nuclear stress test in 2017, mild aortic regurgitation, palpitations and PVCs controlled with metoprolol.  Holter monitor 01/2017 800 PACs and 1200 PVCs.    Patient is coming after 6 months, he has been doing great, he stays very active and walks frequently without any symptoms of chest pain or shortness of breath.  He was not able to tolerate rosuvastatin or pravastatin secondary to significant muscle cramping despite use of magnesium.  He denies any recent palpitations, occasional dizziness that he attributes to Mnire's disease.  No lower extremity edema or claudications.  Past Medical History:  Diagnosis Date  . Adenomatous colon polyp 2001  . Anemia    as a child   . Aortic regurgitation    a. mild by echo 03/2015  . Arthritis   . Cancer (HCC)    hx of skin cancer   . Chest pain 03/31/2015  . Diverticulitis large intestine 04/19/2015  . Diverticulitis of colon - recurrent/persistent 12/29/2014  . Diverticulosis of colon (without mention of hemorrhage) 2005  . GERD (gastroesophageal reflux disease)   . History of kidney stones   . History of skin cancer   . Hyperlipidemia   . Hypertension   . Hypertensive heart disease   . LLQ pain 11/26/2014  . Macular degeneration    left eye  . Orthostasis   . Pleurisy   . Premature atrial contractions   . Prostatitis   . PVC's (premature ventricular contractions)   . 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 Retinal hemorrhage   . SCC (squamous cell carcinoma) 11/13/2018   RIGHT FOREARM CX3 5FU  . Shingles   . Small bowel obstruction ([place]) 05/31/2016  . Squamous cell carcinoma of skin  07/23/2017   SCC IN SITU LEFT UPPER NOSE BRIDGE CX3 5FU CAUTERY    Past Surgical History:  Procedure Laterality Date  . CATARACTS REMOVED    . COLONOSCOPY    . COLONOSCOPY N/A 06/15/2014   Procedure: COLONOSCOPY;  Surgeon: Gatha Mayer, MD;  Location: [facility];  Service: Endoscopy;  Laterality: N/A;  . CYSTOSCOPY WITH RETROGRADE PYELOGRAM, URETEROSCOPY AND STENT PLACEMENT Right 10/18/2013   Procedure: CYSTOSCOPY WITH RIGHT RETROGRADE/RIGHT URETEROSCOPY, STONE BASKETRY,  AND STENT PLACEMENT RIGHT;  Surgeon: Alexis Frock, MD;  Location: [facility];  Service: Urology;  Laterality: Right;  . EYE SURGERY     3 injections to left eye for macular degeneration   . FMT  06/2014  . LAPAROSCOPIC LOW ANTERIOR RESECTION N/A 04/19/2015   Procedure: LAPAROSCOPIC ASSISTED  LOW ANTERIOR RESECTION, splenic flexure mobilization;  Surgeon: Fanny Skates, MD;  Location: [facility];  Service: General;  Laterality: N/A;  . LITHOTRIPSY    . TONSILLECTOMY     Current Medications: Current Meds  Medication Sig  . amLODipine (NORVASC) 5 MG tablet TAKE 1 TABLET BY MOUTH EVERY DAY  . Cholecalciferol (VITAMIN D3) 2000 UNITS TABS Take 1 tablet by mouth daily.  . Coenzyme Q10 (COQ10 PO) Take 100 mg by mouth daily.   . fluticasone (FLONASE) 50 MCG/ACT nasal spray Place 1 spray into both nostrils daily as needed for allergies or rhinitis.  Marland Kitchen  hydroxypropyl methylcellulose / hypromellose (ISOPTO TEARS / GONIOVISC) 2.5 % ophthalmic solution Place 1 drop into both eyes 3 (three) times daily as needed for dry eyes.  Marland Kitchen KRILL OIL PO Take 1 tablet by mouth daily.   . LUTEIN PO Take 25 mg by mouth daily.   . metoprolol succinate (TOPROL-XL) 25 MG 24 hr tablet TAKE 1 TABLET BY MOUTH EVERY DAY  . omeprazole (PRILOSEC) 40 MG capsule TAKE 1 CAPSULE BY MOUTH EVERY DAY NEED OFFICE VISIT  . triamterene-hydrochlorothiazide (DYAZIDE) 37.5-25 MG capsule TAKE 1 CAPSULE BY MOUTH EVERY DAY     Allergies:   Rosuvastatin   Social History    Socioeconomic History  . Marital status: Widowed    Spouse name: Not on file  . Number of children: 1  . Years of education: Not on file  . Highest education level: Not on file  Occupational History  . Occupation: Retired   Tobacco Use  . Smoking status: Never Smoker  . Smokeless tobacco: Never Used  Vaping Use  . Vaping Use: Never used  Substance and Sexual Activity  . Alcohol use: No    Alcohol/week: 0.0 standard drinks  . Drug use: No  . Sexual activity: Not on file  Other Topics Concern  . Not on file  Social History Narrative   He is retired, widowed and has 1 child.   He does not use alcohol tobacco or drugs   Social Determinants of Health   Financial Resource Strain:   . Difficulty of Paying Living Expenses: Not on file  Food Insecurity:   . Worried About Charity fundraiser in the Last Year: Not on file  . Ran Out of Food in the Last Year: Not on file  Transportation Needs:   . Lack of Transportation (Medical): Not on file  . Lack of Transportation (Non-Medical): Not on file  Physical Activity:   . Days of Exercise per Week: Not on file  . Minutes of Exercise per Session: Not on file  Stress:   . Feeling of Stress : Not on file  Social Connections:   . Frequency of Communication with Friends and Family: Not on file  . Frequency of Social Gatherings with Friends and Family: Not on file  . Attends Religious Services: Not on file  . Active Member of Clubs or Organizations: Not on file  . Attends Archivist Meetings: Not on file  . Marital Status: Not on file     Family History:  The patient's family history includes Breast cancer in his mother; Colon cancer in his father; Colon polyps in his father; Heart disease in his mother; Kidney disease in his sister.   ROS:   Please see the history of present illness.    Review of Systems  Constitutional: Negative.  HENT: Negative.   Cardiovascular: Negative.   Respiratory: Negative.   Endocrine:  Negative.   Hematologic/Lymphatic: Negative.   Musculoskeletal: Negative.   Gastrointestinal: Positive for heartburn.  Genitourinary: Negative.   Neurological: Negative.    All other systems reviewed and are negative.  PHYSICAL EXAM:   VS:  BP 138/76   Pulse 70   Ht 5\' 11"  (1.803 m)   Wt 180 lb 3.2 oz (81.7 kg)   SpO2 97%   BMI 25.13 kg/m   Physical Exam  GEN: Well nourished, well developed, in no acute distress  Neck: no JVD, carotid bruits, or masses Cardiac:RRR; 1/6 systolic murmur and 2/6 diastolic murmur at the left sternal border Respiratory:  clear to auscultation bilaterally, normal work of breathing GI: soft, nontender, nondistended, + BS Ext: without cyanosis, clubbing, or edema, Good distal pulses bilaterally Neuro:  Alert and Oriented x 3 Psych: euthymic mood, full affect  Wt Readings from Last 3 Encounters:  01/08/20 180 lb 3.2 oz (81.7 kg)  04/24/19 176 lb 3.2 oz (79.9 kg)  03/27/19 178 lb (80.7 kg)    Studies/Labs Reviewed:   EKG:  EKG is  ordered today.   Shows normal sinus rhythm, PVCs, unchanged from prior, this was personally reviewed.  Recent Labs: 03/27/2019: ALT 33; BUN 23; Creat 1.18; Potassium 3.8; Sodium 140   Lipid Panel    Component Value Date/Time   CHOL 131 10/03/2018 0000   TRIG 152 (H) 10/03/2018 0000   HDL 41 10/03/2018 0000   CHOLHDL 3.2 10/03/2018 0000   LDLCALC 60 10/03/2018 0000    Additional studies/ records that were reviewed today include:  Exercise nuclear stress test: 04/12/2015  Nuclear stress EF: 61%.  Blood pressure demonstrated a hypertensive response to exercise.  There was no ST segment deviation noted during stress.  The study is normal.  This is a low risk study.  The left ventricular ejection fraction is normal (55-65%).   TTE: 04/12/2015 - Left ventricle: The cavity size was normal. Wall thickness was   increased in a pattern of mild LVH. Systolic function was normal.   The estimated ejection fraction was  in the range of 60% to 65%.   Wall motion was normal; there were no regional wall motion   abnormalities. Doppler parameters are consistent with abnormal   left ventricular relaxation (grade 1 diastolic dysfunction). The   E/e&' ratio is <8, suggesting normal LV filling pressure. - Aortic valve: Trileaflet. Sclerosis without stenosis. There was   mild regurgitation. - Mitral valve: Mildly thickened leaflets . There was trivial   regurgitation. - Left atrium: The atrium was normal in size. - Tricuspid valve: There was trivial regurgitation. - Pulmonary arteries: PA peak pressure: 25 mm Hg (S). - Systemic veins: The IVC Was not visualized.   Impressions: - LVEF 65-70%, mild LVH, normal wall motion, diastolic dysfunction   with normal LV filling pressure, aortic sclerosis with mild AI    ASSESSMENT:    1. Essential hypertension   2. Premature atrial contractions   3. Hyperlipidemia, unspecified hyperlipidemia type   4. PVC's (premature ventricular contractions)   5. Mild aortic insufficiency     PLAN:  In order of problems listed above:  1. Essential HTN  -repeat blood pressure 124/72 mmHg, will continue current regimen. 2. Hyperlipidemia -unfortunately patient could not tolerate rosuvastatin or pravastatin, he experienced significant cramping, I will recheck his lipids today and consider Zetia and fenofibrate.  Alternatively we can refer him to the lipid clinic. 3. Mild aortic insufficiency -repeat echo in March 2021 showed stable mild aortic insufficiency.   4. PACs/PVCs - symptoms well controlled on beta blocker. Continue present regimen.   Medication Adjustments/Labs and Tests Ordered: Current medicines are reviewed at length with the patient today.  Concerns regarding medicines are outlined above.  Medication changes, Labs and Tests ordered today are listed in the Patient Instructions below. Patient Instructions  Medication Instructions:  Your provider recommends that you  continue on your current medications as directed. Please refer to the Current Medication list given to you today.   *If you need a refill on your cardiac medications before your next appointment, please call your pharmacy*  Lab Work: TODAY! Lipids, CBC, TSH,  CMET If you have labs (blood work) drawn today and your tests are completely normal, you will receive your results only by: Marland Kitchen MyChart Message (if you have MyChart) OR . A paper copy in the mail If you have any lab test that is abnormal or we need to change your treatment, we will call you to review the results.  Follow-Up: At [facility], you and your health needs are our priority.  As part of our continuing mission to provide you with exceptional heart care, we have created designated Provider Care Teams.  These Care Teams include your primary Cardiologist (physician) and Advanced Practice Providers (APPs -  Physician Assistants and Nurse Practitioners) who all work together to provide you with the care you need, when you need it. Your next appointment:   12 month(s) The format for your next appointment:   In Person Provider:   You may see Ena Dawley, MD or one of the following Advanced Practice Providers on your designated Care Team:    Melina Copa, PA-C  Ermalinda Barrios, PA-C      Signed, Ena Dawley, MD  01/08/2020 10:25 AM    [address], [place], [place]  [address] Phone: [phone]; Fax: [phone]

## 2020-01-09 ENCOUNTER — Telehealth: Payer: Self-pay

## 2020-01-09 ENCOUNTER — Other Ambulatory Visit: Payer: Self-pay | Admitting: *Deleted

## 2020-01-09 DIAGNOSIS — E785 Hyperlipidemia, unspecified: Secondary | ICD-10-CM

## 2020-01-09 NOTE — Progress Notes (Unsigned)
I have placed referral to Lipid Clinic per Dr. Meda Coffee

## 2020-01-09 NOTE — Telephone Encounter (Signed)
Pt given results, per MD. He has been referred to lipid clinic and verbalized understanding.

## 2020-01-10 ENCOUNTER — Other Ambulatory Visit: Payer: Self-pay | Admitting: Family Medicine

## 2020-01-10 DIAGNOSIS — R002 Palpitations: Secondary | ICD-10-CM

## 2020-01-19 ENCOUNTER — Other Ambulatory Visit: Payer: Self-pay

## 2020-01-19 ENCOUNTER — Ambulatory Visit (INDEPENDENT_AMBULATORY_CARE_PROVIDER_SITE_OTHER): Payer: Medicare Other | Admitting: Pharmacist

## 2020-01-19 DIAGNOSIS — E785 Hyperlipidemia, unspecified: Secondary | ICD-10-CM | POA: Insufficient documentation

## 2020-01-19 DIAGNOSIS — E782 Mixed hyperlipidemia: Secondary | ICD-10-CM | POA: Diagnosis not present

## 2020-01-19 MED ORDER — EZETIMIBE 10 MG PO TABS: 10.0000 mg | ORAL_TABLET | Freq: Every day | ORAL | 11 refills | Status: DC

## 2020-01-19 NOTE — Patient Instructions (Addendum)
It was nice to meet you today!  Your LDL is 126 and your goal is < 100 -Start taking ezetimibe (Zetia) 10mg  - 1 tablet once daily  Your triglycerides are 193 and your goal is < 150 - Decrease regular soda and sweet tea - look for diet alternatives  Continue to stay active  Call Reann Dobias, Pharmacist with any questions [phone]  Follow up on Wednesday, February 2nd for fasting lab work any time after 7:30am

## 2020-01-19 NOTE — Progress Notes (Signed)
Patient ID: Joel Bauer                 DOB: Sep 01, 1933                    MRN: [id number]     HPI: Joel Bauer is a 86 y.o. male patient referred to lipid clinic by Dr Meda Coffee. PMH is significant for HTN, HLD, atypical chest pain with normal nuclear stress test in 2017, mild aortic regurgitation, palpitations, and PVCs. He has a history of statin intolerance and was referred to lipid clinic to discuss other options.  Pt presents today in good spirits. Reports previously taking rosuvastatin and pravastatin, both of which caused cramps in his legs within 1 week of statin initiation. He currently takes krill oil and CoQ10 over the counter. Stays active with walking and sports.   Current Medications: krill oil, CoQ10 Intolerances: rosuvastatin 5mg  3 times/week and daily, pravastatin 20mg  daily - muscle cramps Risk Factors: 10 year ASCVD risk > 30% LDL goal: 100mg /dL  Diet: Breakfast - eggs, grits, toast, bacon, or sausage. Lunch - doesn't eat much, sometimes fast food like chicken nuggets or [employer] chili. Dinner - varies - had soup and grilled cheese last night. Snacks - cookie or ice cream. Drinks water, 1/2 sweet/unsweet tea, or soda (1-2 regular per week).    Exercise: Walks every day for 1.5-2 miles, plays golf and tennis. Works in the yard.  Family History: Breast cancer in his mother; Colon cancer in his father; Colon polyps in his father; Heart disease in his mother; Kidney disease in his sister.   Social History: Denies tobacco, alcohol, and drug use.  Labs: 01/08/20: TC 200, TG 193, HDL 40, LDL 126 (on krill oil)  Past Medical History:  Diagnosis Date  . Adenomatous colon polyp 2001  . Anemia    as a child   . Aortic regurgitation    a. mild by echo 03/2015  . Arthritis   . Cancer (HCC)    hx of skin cancer   . Chest pain 03/31/2015  . Diverticulitis large intestine 04/19/2015  . Diverticulitis of colon - recurrent/persistent 12/29/2014  . Diverticulosis of colon  (without mention of hemorrhage) 2005  . GERD (gastroesophageal reflux disease)   . History of kidney stones   . History of skin cancer   . Hyperlipidemia   . Hypertension   . Hypertensive heart disease   . LLQ pain 11/26/2014  . Macular degeneration    left eye  . Orthostasis   . Pleurisy   . Premature atrial contractions   . Prostatitis   . PVC's (premature ventricular contractions)   . 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 Retinal hemorrhage   . SCC (squamous cell carcinoma) 11/13/2018   RIGHT FOREARM CX3 5FU  . Shingles   . Small bowel obstruction ([place]) 05/31/2016  . Squamous cell carcinoma of skin 07/23/2017   SCC IN SITU LEFT UPPER NOSE BRIDGE CX3 5FU CAUTERY    Current Outpatient Medications on File Prior to Visit  Medication Sig Dispense Refill  . amLODipine (NORVASC) 5 MG tablet TAKE 1 TABLET BY MOUTH EVERY DAY 90 tablet 1  . Cholecalciferol (VITAMIN D3) 2000 UNITS TABS Take 1 tablet by mouth daily.    . Coenzyme Q10 (COQ10 PO) Take 100 mg by mouth daily.     . fluticasone (FLONASE) 50 MCG/ACT nasal spray Place 1 spray into both nostrils daily as needed for allergies or rhinitis.    Marland Kitchen  hydroxypropyl methylcellulose / hypromellose (ISOPTO TEARS / GONIOVISC) 2.5 % ophthalmic solution Place 1 drop into both eyes 3 (three) times daily as needed for dry eyes.    Marland Kitchen KRILL OIL PO Take 1 tablet by mouth daily.     . LUTEIN PO Take 25 mg by mouth daily.     . metoprolol succinate (TOPROL-XL) 25 MG 24 hr tablet TAKE 1 TABLET BY MOUTH EVERY DAY 90 tablet 3  . omeprazole (PRILOSEC) 40 MG capsule TAKE 1 CAPSULE BY MOUTH EVERY DAY NEED OFFICE VISIT 90 capsule 3  . triamterene-hydrochlorothiazide (DYAZIDE) 37.5-25 MG capsule TAKE 1 CAPSULE BY MOUTH EVERY DAY 90 capsule 3   No current facility-administered medications on file prior to visit.    Allergies  Allergen Reactions  . Rosuvastatin Other (See Comments)    Pt reports causes muscle cramps in legs     Assessment/Plan:  1. Hyperlipidemia - LDL currently 126 above goal < 100. Pt is intolerant to 2 hydrophilic statins. Will start ezetimibe 10mg  daily. TG also elevated above goal <150. Pt encouraged to decrease intake of sugary drinks as well as fast food. Advised him that he can stop taking krill oil and CoQ10. Follow up with fasting labs in 3 months.   Joel Bauer Joel Bauer, PharmD, BCACP, [place] [address], [place], [place] [address] Phone: [phone]; Fax: [phone] 01/19/2020 10:04 AM

## 2020-03-24 ENCOUNTER — Other Ambulatory Visit: Payer: Self-pay | Admitting: Cardiology

## 2020-04-03 ENCOUNTER — Other Ambulatory Visit: Payer: Self-pay | Admitting: Family Medicine

## 2020-04-03 DIAGNOSIS — K219 Gastro-esophageal reflux disease without esophagitis: Secondary | ICD-10-CM

## 2020-04-20 ENCOUNTER — Other Ambulatory Visit: Payer: Medicare Other | Admitting: *Deleted

## 2020-04-20 ENCOUNTER — Other Ambulatory Visit: Payer: Self-pay

## 2020-04-20 DIAGNOSIS — E782 Mixed hyperlipidemia: Secondary | ICD-10-CM

## 2020-04-20 LAB — HEPATIC FUNCTION PANEL
ALT: 42 IU/L (ref 0–44)
AST: 27 IU/L (ref 0–40)
Albumin: 4.5 g/dL (ref 3.6–4.6)
Alkaline Phosphatase: 72 IU/L (ref 44–121)
Bilirubin Total: 0.6 mg/dL (ref 0.0–1.2)
Bilirubin, Direct: 0.19 mg/dL (ref 0.00–0.40)
Total Protein: 6.7 g/dL (ref 6.0–8.5)

## 2020-04-20 LAB — LIPID PANEL
Chol/HDL Ratio: 4.5 ratio (ref 0.0–5.0)
Cholesterol, Total: 191 mg/dL (ref 100–199)
HDL: 42 mg/dL (ref >39)
LDL Chol Calc (NIH): 110 mg/dL — ABNORMAL HIGH (ref 0–99)
Triglycerides: 227 mg/dL — ABNORMAL HIGH (ref 0–149)
VLDL Cholesterol Cal: 39 mg/dL (ref 5–40)

## 2020-04-21 ENCOUNTER — Other Ambulatory Visit: Payer: Medicare Other

## 2020-05-24 ENCOUNTER — Other Ambulatory Visit: Payer: Self-pay | Admitting: Cardiology

## 2020-09-02 ENCOUNTER — Ambulatory Visit (INDEPENDENT_AMBULATORY_CARE_PROVIDER_SITE_OTHER): Payer: Medicare Other | Admitting: Family Medicine

## 2020-09-02 ENCOUNTER — Other Ambulatory Visit: Payer: Self-pay

## 2020-09-02 ENCOUNTER — Encounter: Payer: Self-pay | Admitting: Family Medicine

## 2020-09-02 VITALS — BP 140/78 | HR 60 | Temp 98.1°F | Resp 14 | Ht 71.0 in | Wt 179.0 lb

## 2020-09-02 DIAGNOSIS — Z0001 Encounter for general adult medical examination with abnormal findings: Secondary | ICD-10-CM

## 2020-09-02 DIAGNOSIS — R252 Cramp and spasm: Secondary | ICD-10-CM

## 2020-09-02 DIAGNOSIS — R5383 Other fatigue: Secondary | ICD-10-CM

## 2020-09-02 DIAGNOSIS — I10 Essential (primary) hypertension: Secondary | ICD-10-CM

## 2020-09-02 DIAGNOSIS — E782 Mixed hyperlipidemia: Secondary | ICD-10-CM | POA: Diagnosis not present

## 2020-09-02 DIAGNOSIS — I119 Hypertensive heart disease without heart failure: Secondary | ICD-10-CM | POA: Diagnosis not present

## 2020-09-02 DIAGNOSIS — Z0000 Encounter for general adult medical examination without abnormal findings: Secondary | ICD-10-CM

## 2020-09-02 NOTE — Progress Notes (Signed)
Subjective:    Patient ID: Joel Bauer, male    DOB: 11/08/1933, 87 y.o.   MRN: [id number]    Patient is a very pleasant 87 year old Caucasian male here today for complete physical exam.  Past medical history is significant for partial colectomy secondary to recurrent diverticulitis, mild aortic regurgitation, and hypertension.  It is been quite sometime since I saw him.  He does report cramps in his legs at night.  He is been taking an over-the-counter magnesium supplement with no relief.  He also complains of worsening essential tremor in both arms.  It gets worse with activity.  He denies any dizziness.  He denies any falls.  He denies any bradykinesia.  He does not demonstrate any parkinsonian features.  He does have a fine high-frequency tremor in both hands per tickly worse when he tries to hold them still.  I believe this is most likely an essential tremor.  His mother had an essential tremor and he has had this for years.  He also reports decreasing muscle mass and increasing fatigue.  He attributes this to age however he is interested to see if his testosterone is low Immunization History  Administered Date(s) Administered   Influenza, High Dose Seasonal PF 12/11/2018, 12/11/2018   Influenza,inj,Quad PF,6+ Mos 12/26/2016, 12/31/2017   Influenza-Unspecified 03/06/2007, 12/27/2007, 11/18/2012, 04/03/2016   PFIZER(Purple Top)SARS-COV-2 Vaccination 04/10/2019, 05/01/2019   Pneumococcal Conjugate-13 03/27/2019   Pneumococcal Polysaccharide-23 07/28/2010   Td 11/10/2003     Past Medical History:  Diagnosis Date   Adenomatous colon polyp 2001   Anemia    as a child    Aortic regurgitation    a. mild by echo 03/2015   Arthritis    Cancer ([place])    hx of skin cancer    Chest pain 03/31/2015   Diverticulitis large intestine 04/19/2015   Diverticulitis of colon - recurrent/persistent 12/29/2014   Diverticulosis of colon (without mention of hemorrhage) 2005   GERD  (gastroesophageal reflux disease)    History of kidney stones    History of skin cancer    Hyperlipidemia    Hypertension    Hypertensive heart disease    LLQ pain 11/26/2014   Macular degeneration    left eye   Orthostasis    Pleurisy    Premature atrial contractions    Prostatitis    PVC's (premature ventricular contractions)    Recurrent colitis due to Clostridium difficile    Retinal hemorrhage    SCC (squamous cell carcinoma) 11/13/2018   RIGHT FOREARM CX3 5FU   Shingles    Small bowel obstruction ([place]) 05/31/2016   Squamous cell carcinoma of skin 07/23/2017   SCC IN SITU LEFT UPPER NOSE BRIDGE CX3 5FU CAUTERY   Past Surgical History:  Procedure Laterality Date   CATARACTS REMOVED     COLONOSCOPY     COLONOSCOPY N/A 06/15/2014   Procedure: COLONOSCOPY;  Surgeon: Gatha Mayer, MD;  Location: [facility] ENDOSCOPY;  Service: Endoscopy;  Laterality: N/A;   CYSTOSCOPY WITH RETROGRADE PYELOGRAM, URETEROSCOPY AND STENT PLACEMENT Right 10/18/2013   Procedure: CYSTOSCOPY WITH RIGHT RETROGRADE/RIGHT URETEROSCOPY, STONE BASKETRY,  AND STENT PLACEMENT RIGHT;  Surgeon: Alexis Frock, MD;  Location: [facility] ORS;  Service: Urology;  Laterality: Right;   EYE SURGERY     3 injections to left eye for macular degeneration    FMT  06/2014   LAPAROSCOPIC LOW ANTERIOR RESECTION N/A 04/19/2015   Procedure: LAPAROSCOPIC ASSISTED  LOW ANTERIOR RESECTION, splenic flexure  mobilization;  Surgeon: Fanny Skates, MD;  Location: [facility];  Service: General;  Laterality: N/A;   LITHOTRIPSY     TONSILLECTOMY     Current Outpatient Medications on File Prior to Visit  Medication Sig Dispense Refill   amLODipine (NORVASC) 5 MG tablet TAKE 1 TABLET BY MOUTH EVERY DAY 90 tablet 2   ezetimibe (ZETIA) 10 MG tablet Take 1 tablet (10 mg total) by mouth daily. 30 tablet 11   fluticasone (FLONASE) 50 MCG/ACT nasal spray Place 1 spray into both nostrils daily as needed for allergies or rhinitis.     hydroxypropyl methylcellulose /  hypromellose (ISOPTO TEARS / GONIOVISC) 2.5 % ophthalmic solution Place 1 drop into both eyes 3 (three) times daily as needed for dry eyes.     metoprolol succinate (TOPROL-XL) 25 MG 24 hr tablet TAKE 1 TABLET BY MOUTH EVERY DAY 90 tablet 3   multivitamin-lutein (OCUVITE-LUTEIN) CAPS capsule Take 1 capsule by mouth daily.     omeprazole (PRILOSEC) 40 MG capsule TAKE 1 CAPSULE BY MOUTH EVERY DAY NEED OFFICE VISIT 90 capsule 3   triamterene-hydrochlorothiazide (DYAZIDE) 37.5-25 MG capsule TAKE 1 CAPSULE BY MOUTH EVERY DAY 90 capsule 3   No current facility-administered medications on file prior to visit.   Allergies  Allergen Reactions   Rosuvastatin Other (See Comments)    Pt reports causes muscle cramps in legs   Social History   Socioeconomic History   Marital status: Widowed    Spouse name: Not on file   Number of children: 1   Years of education: Not on file   Highest education level: Not on file  Occupational History   Occupation: Retired   Tobacco Use   Smoking status: Never   Smokeless tobacco: Never  Vaping Use   Vaping Use: Never used  Substance and Sexual Activity   Alcohol use: No    Alcohol/week: 0.0 standard drinks   Drug use: No   Sexual activity: Not on file  Other Topics Concern   Not on file  Social History Narrative   He is retired, widowed and has 1 child.   He does not use alcohol tobacco or drugs   Social Determinants of Radio broadcast assistant Strain: Not on file  Food Insecurity: Not on file  Transportation Needs: Not on file  Physical Activity: Not on file  Stress: Not on file  Social Connections: Not on file  Intimate Partner Violence: Not on file      Review of Systems  All other systems reviewed and are negative.     Objective:   Physical Exam Vitals reviewed.  Constitutional:      General: He is not in acute distress.    Appearance: He is well-developed. He is not diaphoretic.  Eyes:     Conjunctiva/sclera: Conjunctivae  normal.     Pupils: Pupils are equal, round, and reactive to light.  Neck:     Vascular: No JVD.  Cardiovascular:     Rate and Rhythm: Normal rate and regular rhythm.     Heart sounds: Normal heart sounds. No murmur heard. Pulmonary:     Effort: Pulmonary effort is normal. No respiratory distress.     Breath sounds: Normal breath sounds. No wheezing or rales.  Abdominal:     General: Bowel sounds are normal. There is no distension.     Palpations: Abdomen is soft. There is no mass.     Tenderness: There is no abdominal tenderness. There is no guarding or rebound.  Musculoskeletal:     Cervical back: Neck supple.  Lymphadenopathy:     Cervical: No cervical adenopathy.          Assessment & Plan:  Hypertensive heart disease without heart failure - Plan: CBC with Differential/Platelet, COMPLETE METABOLIC PANEL WITH GFR, Lipid panel  Benign essential HTN - Plan: CBC with Differential/Platelet, COMPLETE METABOLIC PANEL WITH GFR, Lipid panel  Mixed hyperlipidemia - Plan: CBC with Differential/Platelet, COMPLETE METABOLIC PANEL WITH GFR, Lipid panel  Cramps of lower extremity - Plan: Magnesium  Fatigue, unspecified type - Plan: Testosterone Total,Free,Bio, Males  General medical exam Exam today is reassuring.  We discussed starting primidone for essential tremor however he elects not to do this.  I recommended he try tonic water with quinine for his cramps.  I will also check a magnesium level along with a CMP to evaluate for any hypomagnesia or hypokalemia.  Blood pressure today is well controlled.  Check CBC, CMP, lipid panel.  Given the fatigue and the declining muscle mass, I will check a testosterone level. Immunization History  Administered Date(s) Administered   Influenza, High Dose Seasonal PF 12/11/2018, 12/11/2018   Influenza,inj,Quad PF,6+ Mos 12/26/2016, 12/31/2017   Influenza-Unspecified 03/06/2007, 12/27/2007, 11/18/2012, 04/03/2016   PFIZER(Purple Top)SARS-COV-2  Vaccination 04/10/2019, 05/01/2019, 01/27/2020   Pneumococcal Conjugate-13 03/27/2019   Pneumococcal Polysaccharide-23 07/28/2010   Td 11/10/2003   Immunizations are up-to-date.  Based on his age she does not require any cancer screening.  He denies any falls, memory loss, or depression

## 2020-09-03 LAB — CBC WITH DIFFERENTIAL/PLATELET
Absolute Monocytes: 410 cells/uL (ref 200–950)
Basophils Absolute: 60 cells/uL (ref 0–200)
Basophils Relative: 1.2 %
Eosinophils Absolute: 160 cells/uL (ref 15–500)
Eosinophils Relative: 3.2 %
HCT: 48.4 % (ref 38.5–50.0)
Hemoglobin: 16.5 g/dL (ref 13.2–17.1)
Lymphs Abs: 1395 cells/uL (ref 850–3900)
MCH: 30.7 pg (ref 27.0–33.0)
MCHC: 34.1 g/dL (ref 32.0–36.0)
MCV: 90 fL (ref 80.0–100.0)
MPV: 10.2 fL (ref 7.5–12.5)
Monocytes Relative: 8.2 %
Neutro Abs: 2975 cells/uL (ref 1500–7800)
Neutrophils Relative %: 59.5 %
Platelets: 182 10*3/uL (ref 140–400)
RBC: 5.38 10*6/uL (ref 4.20–5.80)
RDW: 14 % (ref 11.0–15.0)
Total Lymphocyte: 27.9 %
WBC: 5 10*3/uL (ref 3.8–10.8)

## 2020-09-03 LAB — LIPID PANEL
Cholesterol: 160 mg/dL (ref <200)
HDL: 43 mg/dL (ref >=40)
LDL Cholesterol (Calc): 89 mg/dL (calc)
Non-HDL Cholesterol (Calc): 117 mg/dL (calc) (ref <130)
Total CHOL/HDL Ratio: 3.7 (calc) (ref <5.0)
Triglycerides: 179 mg/dL — ABNORMAL HIGH (ref <150)

## 2020-09-03 LAB — COMPLETE METABOLIC PANEL WITH GFR
AG Ratio: 1.9 (calc) (ref 1.0–2.5)
ALT: 43 U/L (ref 9–46)
AST: 26 U/L (ref 10–35)
Albumin: 4.4 g/dL (ref 3.6–5.1)
Alkaline phosphatase (APISO): 58 U/L (ref 35–144)
BUN/Creatinine Ratio: 17 (calc) (ref 6–22)
BUN: 21 mg/dL (ref 7–25)
CO2: 31 mmol/L (ref 20–32)
Calcium: 9.6 mg/dL (ref 8.6–10.3)
Chloride: 100 mmol/L (ref 98–110)
Creat: 1.22 mg/dL — ABNORMAL HIGH (ref 0.70–1.11)
GFR, Est African American: 63 mL/min/{1.73_m2} (ref >=60)
GFR, Est Non African American: 54 mL/min/{1.73_m2} — ABNORMAL LOW (ref >=60)
Globulin: 2.3 g/dL (calc) (ref 1.9–3.7)
Glucose, Bld: 94 mg/dL (ref 65–99)
Potassium: 4 mmol/L (ref 3.5–5.3)
Sodium: 140 mmol/L (ref 135–146)
Total Bilirubin: 0.7 mg/dL (ref 0.2–1.2)
Total Protein: 6.7 g/dL (ref 6.1–8.1)

## 2020-09-03 LAB — TESTOSTERONE TOTAL,FREE,BIO, MALES
Albumin: 4.4 g/dL (ref 3.6–5.1)
Sex Hormone Binding: 29 nmol/L (ref 22–77)
Testosterone, Bioavailable: 105.6 ng/dL (ref 15.0–150.0)
Testosterone, Free: 52.5 pg/mL (ref 6.0–73.0)
Testosterone: 363 ng/dL (ref 250–827)

## 2020-09-03 LAB — MAGNESIUM: Magnesium: 2.2 mg/dL (ref 1.5–2.5)

## 2020-10-01 ENCOUNTER — Other Ambulatory Visit: Payer: Self-pay

## 2020-10-01 MED ORDER — AMLODIPINE BESYLATE 5 MG PO TABS: 5.0000 mg | ORAL_TABLET | Freq: Every day | ORAL | 1 refills | Status: DC

## 2021-01-15 ENCOUNTER — Other Ambulatory Visit: Payer: Self-pay | Admitting: Family Medicine

## 2021-01-15 DIAGNOSIS — R002 Palpitations: Secondary | ICD-10-CM

## 2021-01-17 ENCOUNTER — Other Ambulatory Visit: Payer: Self-pay

## 2021-01-24 NOTE — Progress Notes (Signed)
Cardiology Office Note:    Date:  02/07/2021   ID:  Joel Bauer, DOB Feb 17, 1934, MRN [id number]  PCP:  Susy Frizzle, MD   [facility] Providers Cardiologist:  Ena Dawley, MD (Inactive) {   Referring MD: Susy Frizzle, MD    History of Present Illness:    Joel Bauer is a 87 y.o. male with a hx of  HTN, HLD intolerant of statins, atypical chest pain with normal nuclear stress test in 2017, mild aortic regurgitation, palpitations, and PVCs controlled on metop who was previously followed by Dr. Meda Coffee who now returns to clinic for annual cardiac follow-up.  Patient was last seen by Dr. Meda Coffee on 01/08/20 where he was doing well and remained active without chest pain or SOB. Holter 01/2017 with 800 PACs and 1200 PVCs with palpitations currently controlled with metoprolol. TTE 06/04/19 with LVEF 60-65% with mild AI unchanged from prior TTE. Nuc 2017 with no evidence of ischemia or infarction.  Today, the patient feels overall well. Continues to work in the yard and play golf without chest pain or SOB. Occasional palpitations but very rarely. Continues to take metorprolol. No LE edema, orthopnea, or PND. Blood pressure elevated in the office but states it usually okay at home. Continues to have intermittent tremors at home. Does not want to switch medications (metop to propranolol) at this time.   Past Medical History:  Diagnosis Date   Adenomatous colon polyp 2001   Anemia    as a child    Aortic regurgitation    a. mild by echo 03/2015   Arthritis    Cancer ([place])    hx of skin cancer    Chest pain 03/31/2015   Diverticulitis large intestine 04/19/2015   Diverticulitis of colon - recurrent/persistent 12/29/2014   Diverticulosis of colon (without mention of hemorrhage) 2005   GERD (gastroesophageal reflux disease)    History of kidney stones    History of skin cancer    Hyperlipidemia    Hypertension    Hypertensive heart disease    LLQ pain 11/26/2014    Macular degeneration    left eye   Orthostasis    Pleurisy    Premature atrial contractions    Prostatitis    PVC's (premature ventricular contractions)    Recurrent colitis due to Clostridium difficile    Retinal hemorrhage    SCC (squamous cell carcinoma) 11/13/2018   RIGHT FOREARM CX3 5FU   Shingles    Small bowel obstruction ([place]) 05/31/2016   Squamous cell carcinoma of skin 07/23/2017   SCC IN SITU LEFT UPPER NOSE BRIDGE CX3 5FU CAUTERY    Past Surgical History:  Procedure Laterality Date   CATARACTS REMOVED     COLONOSCOPY     COLONOSCOPY N/A 06/15/2014   Procedure: COLONOSCOPY;  Surgeon: Gatha Mayer, MD;  Location: [place];  Service: Endoscopy;  Laterality: N/A;   CYSTOSCOPY WITH RETROGRADE PYELOGRAM, URETEROSCOPY AND STENT PLACEMENT Right 10/18/2013   Procedure: CYSTOSCOPY WITH RIGHT RETROGRADE/RIGHT URETEROSCOPY, STONE BASKETRY,  AND STENT PLACEMENT RIGHT;  Surgeon: Alexis Frock, MD;  Location: [facility];  Service: Urology;  Laterality: Right;   EYE SURGERY     3 injections to left eye for macular degeneration    FMT  06/2014   LAPAROSCOPIC LOW ANTERIOR RESECTION N/A 04/19/2015   Procedure: LAPAROSCOPIC ASSISTED  LOW ANTERIOR RESECTION, splenic flexure mobilization;  Surgeon: Fanny Skates, MD;  Location: [facility];  Service: General;  Laterality: N/A;   LITHOTRIPSY  TONSILLECTOMY      Current Medications: Current Meds  Medication Sig   amLODipine (NORVASC) 5 MG tablet Take 1 tablet (5 mg total) by mouth daily. Please keep upcoming appt in November 2022 with Dr. Johney Frame (Cardiologist) before anymore refills. Thank you   fluticasone (FLONASE) 50 MCG/ACT nasal spray Place 1 spray into both nostrils daily as needed for allergies or rhinitis.   hydroxypropyl methylcellulose / hypromellose (ISOPTO TEARS / GONIOVISC) 2.5 % ophthalmic solution Place 1 drop into both eyes 3 (three) times daily as needed for dry eyes.   metoprolol succinate (TOPROL-XL) 25 MG 24 hr tablet  TAKE 1 TABLET BY MOUTH EVERY DAY   multivitamin-lutein (OCUVITE-LUTEIN) CAPS capsule Take 1 capsule by mouth daily.   omeprazole (PRILOSEC) 40 MG capsule TAKE 1 CAPSULE BY MOUTH EVERY DAY NEED OFFICE VISIT   triamterene-hydrochlorothiazide (DYAZIDE) 37.5-25 MG capsule TAKE 1 CAPSULE BY MOUTH EVERY DAY     Allergies:   Rosuvastatin   Social History   Socioeconomic History   Marital status: Widowed    Spouse name: Not on file   Number of children: 1   Years of education: Not on file   Highest education level: Not on file  Occupational History   Occupation: Retired   Tobacco Use   Smoking status: Never   Smokeless tobacco: Never  Vaping Use   Vaping Use: Never used  Substance and Sexual Activity   Alcohol use: No    Alcohol/week: 0.0 standard drinks   Drug use: No   Sexual activity: Not on file  Other Topics Concern   Not on file  Social History Narrative   He is retired, widowed and has 1 child.   He does not use alcohol tobacco or drugs   Social Determinants of Radio broadcast assistant Strain: Not on file  Food Insecurity: Not on file  Transportation Needs: Not on file  Physical Activity: Not on file  Stress: Not on file  Social Connections: Not on file     Family History: The patient's family history includes Breast cancer in his mother; Colon cancer in his father; Colon polyps in his father; Heart disease in his mother; Kidney disease in his sister.  ROS:   Please see the history of present illness.    Review of Systems  Constitutional:  Negative for chills and fever.  HENT:  Negative for sore throat.   Eyes:  Negative for blurred vision.  Respiratory:  Negative for shortness of breath.   Cardiovascular:  Positive for palpitations. Negative for chest pain, orthopnea, claudication, leg swelling and PND.  Gastrointestinal:  Negative for nausea and vomiting.  Genitourinary:  Negative for flank pain.  Neurological:  Negative for dizziness and loss of  consciousness.    EKGs/Labs/Other Studies Reviewed:    The following studies were reviewed today: TTE Jul 01, 2019: IMPRESSIONS   1. Left ventricular ejection fraction, by estimation, is 60 to 65%. The  left ventricle has normal function. The left ventricle has no regional  wall motion abnormalities. There is mild left ventricular hypertrophy.  Left ventricular diastolic parameters  are consistent with Grade I diastolic dysfunction (impaired relaxation).  The average left ventricular global longitudinal strain is -21.8 %.   2. Right ventricular systolic function is normal. The right ventricular  size is normal.   3. The mitral valve is abnormal. Trivial mitral valve regurgitation.   4. The aortic valve has been repaired/replaced. Aortic valve  regurgitation is mild. Mild aortic valve sclerosis is present, with no  evidence of aortic valve stenosis.   Comparison(s): No significant change from prior study. 04/12/15: LVEF  60-65%, mild AI.  Holter 2018: Sinus bradycardia to sinus rhythm. Frequent PACs and PVC. 1 run of atrial tachycardia lasting 4 beats.   Frequent PACs and PVCs but no significant arrhythmias.  Nuc 2017: Nuclear stress EF: 61%. Blood pressure demonstrated a hypertensive response to exercise. There was no ST segment deviation noted during stress. The study is normal. This is a low risk study. The left ventricular ejection fraction is normal (55-65%).  EKG:  EKG is  ordered today.  The ekg ordered today demonstrates NSR with HR 66  Recent Labs: 09/02/2020: ALT 43; BUN 21; Creat 1.22; Hemoglobin 16.5; Magnesium 2.2; Platelets 182; Potassium 4.0; Sodium 140  Recent Lipid Panel    Component Value Date/Time   CHOL 160 09/02/2020 1059   CHOL 191 04/20/2020 0931   TRIG 179 (H) 09/02/2020 1059   HDL 43 09/02/2020 1059   HDL 42 04/20/2020 0931   CHOLHDL 3.7 09/02/2020 1059   LDLCALC 89 09/02/2020 1059     Risk Assessment/Calculations:           Physical Exam:     VS:  BP 140/80   Pulse 66   Ht 5\' 11"  (1.803 m)   Wt 177 lb (80.3 kg)   SpO2 98%   BMI 24.69 kg/m     Wt Readings from Last 3 Encounters:  02/07/21 177 lb (80.3 kg)  09/02/20 179 lb (81.2 kg)  01/08/20 180 lb 3.2 oz (81.7 kg)     GEN:  Comfortable HEENT: Normal NECK: No JVD; No carotid bruits CARDIAC: RRR, 1/6 systolic murmur. No rubs, gallops RESPIRATORY:  Clear to auscultation without rales, wheezing or rhonchi  ABDOMEN: Soft, non-tender, non-distended MUSCULOSKELETAL:  No edema; No deformity  SKIN: Warm and dry NEUROLOGIC:  Alert and oriented x 3 PSYCHIATRIC:  Normal affect   ASSESSMENT:    1. Mixed hyperlipidemia   2. Hyperlipidemia, unspecified hyperlipidemia type   3. Essential hypertension   4. Mild aortic insufficiency   5. PVC's (premature ventricular contractions)   6. Aortic valve insufficiency, etiology of cardiac valve disease unspecified    PLAN:    In order of problems listed above:  #HTN: Elevated in the office today but states is usually controlled at home. Will monitor blood pressures on home cuff and if consistently >120/80s, will adjust his medications.  -Continue metop 25mg  XL daily -Continue amlodipine 5mg  daily -Continue triamterene-HCTZ 37.5-25mg  daily  #HLD: #Statin Intolerance: Did not tolerate statins due to significant muscle cramps. Specifically did not tolerate prava or rosuva. Followed by lipid clinic and now on zetia. -Continue zetia 10mg  daily -LDL 89; goal<100  #Mild AI: Stable on TTE in 05/2019.  -Repeat TTE in 2024  #PACs/PVCs: Controlled on metoprolol. -Continue metop 25mg  daily          Medication Adjustments/Labs and Tests Ordered: Current medicines are reviewed at length with the patient today.  Concerns regarding medicines are outlined above.  Orders Placed This Encounter  Procedures   EKG 12-Lead   No orders of the defined types were placed in this encounter.   Patient Instructions  Medication  Instructions:   Your physician recommends that you continue on your current medications as directed. Please refer to the Current Medication list given to you today.  *If you need a refill on your cardiac medications before your next appointment, please call your pharmacy*   Follow-Up: At [facility] you and your  health needs are our priority.  As part of our continuing mission to provide you with exceptional heart care, we have created designated Provider Care Teams.  These Care Teams include your primary Cardiologist (physician) and Advanced Practice Providers (APPs -  Physician Assistants and Nurse Practitioners) who all work together to provide you with the care you need, when you need it.  We recommend signing up for the patient portal called "MyChart".  Sign up information is provided on this After Visit Summary.  MyChart is used to connect with patients for Virtual Visits (Telemedicine).  Patients are able to view lab/test results, encounter notes, upcoming appointments, etc.  Non-urgent messages can be sent to your provider as well.   To learn more about what you can do with MyChart, go to [email or web address].    Your next appointment:   6 month(s)  The format for your next appointment:   In Person  Provider:    DR. Johney Frame    Signed, Freada Bergeron, MD  02/07/2021 12:51 PM    [place]

## 2021-02-07 ENCOUNTER — Encounter: Payer: Self-pay | Admitting: Cardiology

## 2021-02-07 ENCOUNTER — Other Ambulatory Visit: Payer: Self-pay

## 2021-02-07 ENCOUNTER — Ambulatory Visit: Payer: Medicare Other | Admitting: Cardiology

## 2021-02-07 VITALS — BP 140/80 | HR 66 | Ht 71.0 in | Wt 177.0 lb

## 2021-02-07 DIAGNOSIS — E782 Mixed hyperlipidemia: Secondary | ICD-10-CM

## 2021-02-07 DIAGNOSIS — I10 Essential (primary) hypertension: Secondary | ICD-10-CM | POA: Diagnosis not present

## 2021-02-07 DIAGNOSIS — I493 Ventricular premature depolarization: Secondary | ICD-10-CM

## 2021-02-07 DIAGNOSIS — E785 Hyperlipidemia, unspecified: Secondary | ICD-10-CM | POA: Diagnosis not present

## 2021-02-07 DIAGNOSIS — I351 Nonrheumatic aortic (valve) insufficiency: Secondary | ICD-10-CM

## 2021-02-07 NOTE — Patient Instructions (Signed)
Medication Instructions:   Your physician recommends that you continue on your current medications as directed. Please refer to the Current Medication list given to you today.  *If you need a refill on your cardiac medications before your next appointment, please call your pharmacy*   Follow-Up: At [facility], you and your health needs are our priority.  As part of our continuing mission to provide you with exceptional heart care, we have created designated Provider Care Teams.  These Care Teams include your primary Cardiologist (physician) and Advanced Practice Providers (APPs -  Physician Assistants and Nurse Practitioners) who all work together to provide you with the care you need, when you need it.  We recommend signing up for the patient portal called "MyChart".  Sign up information is provided on this After Visit Summary.  MyChart is used to connect with patients for Virtual Visits (Telemedicine).  Patients are able to view lab/test results, encounter notes, upcoming appointments, etc.  Non-urgent messages can be sent to your provider as well.   To learn more about what you can do with MyChart, go to [email or web address].    Your next appointment:   6 month(s)  The format for your next appointment:   In Person  Provider:    DR. PEMBERTON   

## 2021-02-17 ENCOUNTER — Other Ambulatory Visit: Payer: Self-pay

## 2021-02-17 MED ORDER — EZETIMIBE 10 MG PO TABS: 10.0000 mg | ORAL_TABLET | Freq: Every day | ORAL | 11 refills | Status: DC

## 2021-02-18 ENCOUNTER — Encounter: Payer: Self-pay | Admitting: Cardiology

## 2021-03-30 ENCOUNTER — Encounter: Payer: Self-pay | Admitting: Dermatology

## 2021-03-30 ENCOUNTER — Other Ambulatory Visit: Payer: Self-pay

## 2021-03-30 ENCOUNTER — Ambulatory Visit: Payer: Medicare Other | Admitting: Dermatology

## 2021-03-30 DIAGNOSIS — Z1283 Encounter for screening for malignant neoplasm of skin: Secondary | ICD-10-CM | POA: Diagnosis not present

## 2021-03-30 DIAGNOSIS — L570 Actinic keratosis: Secondary | ICD-10-CM | POA: Diagnosis not present

## 2021-03-30 DIAGNOSIS — D485 Neoplasm of uncertain behavior of skin: Secondary | ICD-10-CM | POA: Diagnosis not present

## 2021-03-30 DIAGNOSIS — L578 Other skin changes due to chronic exposure to nonionizing radiation: Secondary | ICD-10-CM | POA: Diagnosis not present

## 2021-03-30 NOTE — Patient Instructions (Signed)

## 2021-04-14 ENCOUNTER — Other Ambulatory Visit: Payer: Self-pay

## 2021-04-14 DIAGNOSIS — K219 Gastro-esophageal reflux disease without esophagitis: Secondary | ICD-10-CM

## 2021-04-14 MED ORDER — OMEPRAZOLE 40 MG PO CPDR: 40.0000 mg | DELAYED_RELEASE_CAPSULE | Freq: Every day | ORAL | 3 refills | Status: DC

## 2021-04-23 ENCOUNTER — Encounter: Payer: Self-pay | Admitting: Dermatology

## 2021-04-23 NOTE — Progress Notes (Signed)
° °  Follow-Up Visit   Subjective  Joel Bauer is a 88 y.o. male who presents for the following: Annual Exam (Few scaly spots on face & back I want checked).  General skin check, several spots of concern. Location:  Duration:  Quality:  Associated Signs/Symptoms: Modifying Factors:  Severity:  Timing: Context:   Objective  Well appearing patient in no apparent distress; mood and affect are within normal limits. Scalp Waist up skin examination, 1 atypical pigmented lesion abnormality 1 possible nonmelanoma skin cancer Left ear will be biopsied.  Pervious shingles on the right abdomen x 3 months ago, many keratoses on the body.  Right Abdomen (side) - Upper Black gray irregular 5 mm macule, dermoscopy amorphous     Left Posterior Neck Waxy gray 1.5 cm crust     Left Antihelix, face (5) Gritty 4 mm pink crust       All skin waist up examined.   Assessment & Plan    Screening exam for skin cancer Scalp  Annual skin examination.  Neoplasm of uncertain behavior of skin (2) Right Abdomen (side) - Upper  Skin / nail biopsy Type of biopsy: tangential   Informed consent: discussed and consent obtained   Timeout: patient name, date of birth, surgical site, and procedure verified   Anesthesia: the lesion was anesthetized in a standard fashion   Anesthetic:  1% lidocaine w/ epinephrine 1-100,000 local infiltration Instrument used: flexible razor blade   Hemostasis achieved with: aluminum chloride and electrodesiccation   Outcome: patient tolerated procedure well   Post-procedure details: wound care instructions given    Specimen 1 - Surgical pathology Differential Diagnosis: atypia  Check Margins: No  Left Posterior Neck  Skin / nail biopsy  Specimen 2 - Surgical pathology Differential Diagnosis: bcc scc cautery only  Check Margins: No  AK (actinic keratosis) (5) Left Antihelix, face  Destruction of lesion - Left Antihelix, face Complexity:  simple   Destruction method: cryotherapy   Informed consent: discussed and consent obtained   Timeout:  patient name, date of birth, surgical site, and procedure verified Lesion destroyed using liquid nitrogen: Yes   Cryotherapy cycles:  3 Outcome: patient tolerated procedure well with no complications   Post-procedure details: wound care instructions given        I, Lavonna Monarch, MD, have reviewed all documentation for this visit.  The documentation on 04/23/21 for the exam, diagnosis, procedures, and orders are all accurate and complete.

## 2021-06-13 ENCOUNTER — Other Ambulatory Visit: Payer: Self-pay

## 2021-06-13 ENCOUNTER — Ambulatory Visit: Payer: Medicare Other | Admitting: Family Medicine

## 2021-06-13 VITALS — BP 142/70 | HR 65 | Temp 97.3°F | Ht 71.0 in | Wt 180.0 lb

## 2021-06-13 DIAGNOSIS — S70362D Insect bite (nonvenomous), left thigh, subsequent encounter: Secondary | ICD-10-CM

## 2021-06-13 DIAGNOSIS — S70362A Insect bite (nonvenomous), left thigh, initial encounter: Secondary | ICD-10-CM | POA: Diagnosis not present

## 2021-06-13 DIAGNOSIS — E782 Mixed hyperlipidemia: Secondary | ICD-10-CM

## 2021-06-13 DIAGNOSIS — W57XXXA Bitten or stung by nonvenomous insect and other nonvenomous arthropods, initial encounter: Secondary | ICD-10-CM | POA: Diagnosis not present

## 2021-06-13 DIAGNOSIS — I10 Essential (primary) hypertension: Secondary | ICD-10-CM | POA: Diagnosis not present

## 2021-06-13 NOTE — Progress Notes (Signed)
? ? ? ?Subjective:  ? ? Patient ID: Joel Bauer, male    DOB: February 29, 1934, 88 y.o.   MRN: [id number] ? ?Patient had a tick bite on his upper anterior lateral left thigh.  This was found in February.  He went to an urgent care where the tick was removed and he was started on 10 days of doxycycline.  He went back 10 days later because he felt that there was still part of the tick inside that had not been removed.  The urgent care removed residual tick material with a scalpel and gave him another 10 days of doxycycline.  There is now a papule in that area this is proximately 5 mm in diameter.  There appears to be a hard black foreign body in the center of that papule that is roughly 1 to 2 mm in diameter.  It appears to be retained material.  He denies any fever chills headaches.  There is no rash other than the hard papule described above. ?Past Medical History:  ?Diagnosis Date  ? Adenomatous colon polyp 2001  ? Anemia   ? as a child   ? Aortic regurgitation   ? a. mild by echo 03/2015  ? Arthritis   ? Cancer [facility])   ? hx of skin cancer   ? Chest pain 03/31/2015  ? Diverticulitis large intestine 04/19/2015  ? Diverticulitis of colon - recurrent/persistent 12/29/2014  ? Diverticulosis of colon (without mention of hemorrhage) 2005  ? GERD (gastroesophageal reflux disease)   ? History of kidney stones   ? History of skin cancer   ? Hyperlipidemia   ? Hypertension   ? Hypertensive heart disease   ? LLQ pain 11/26/2014  ? Macular degeneration   ? left eye  ? Orthostasis   ? Pleurisy   ? Premature atrial contractions   ? Prostatitis   ? PVC's (premature ventricular contractions)   ? Recurrent colitis due to Clostridium difficile   ? Retinal hemorrhage   ? SCC (squamous cell carcinoma) 11/13/2018  ? RIGHT FOREARM CX3 5FU  ? Shingles   ? Small bowel obstruction ([place]) 05/31/2016  ? Squamous cell carcinoma of skin 07/23/2017  ? SCC IN SITU LEFT UPPER NOSE BRIDGE CX3 5FU CAUTERY  ? ?Past Surgical History:  ?Procedure Laterality  Date  ? CATARACTS REMOVED    ? COLONOSCOPY    ? COLONOSCOPY N/A 06/15/2014  ? Procedure: COLONOSCOPY;  Surgeon: Gatha Mayer, MD;  Location: [place];  Service: Endoscopy;  Laterality: N/A;  ? CYSTOSCOPY WITH RETROGRADE PYELOGRAM, URETEROSCOPY AND STENT PLACEMENT Right 10/18/2013  ? Procedure: CYSTOSCOPY WITH RIGHT RETROGRADE/RIGHT URETEROSCOPY, STONE BASKETRY,  AND STENT PLACEMENT RIGHT;  Surgeon: Alexis Frock, MD;  Location: [facility];  Service: Urology;  Laterality: Right;  ? EYE SURGERY    ? 3 injections to left eye for macular degeneration   ? FMT  06/2014  ? LAPAROSCOPIC LOW ANTERIOR RESECTION N/A 04/19/2015  ? Procedure: LAPAROSCOPIC ASSISTED  LOW ANTERIOR RESECTION, splenic flexure mobilization;  Surgeon: Fanny Skates, MD;  Location: [facility];  Service: General;  Laterality: N/A;  ? LITHOTRIPSY    ? TONSILLECTOMY    ? ?Current Outpatient Medications on File Prior to Visit  ?Medication Sig Dispense Refill  ? amLODipine (NORVASC) 5 MG tablet Take 1 tablet (5 mg total) by mouth daily. Please keep upcoming appt in November 2022 with Dr. Johney Frame (Cardiologist) before anymore refills. Thank you 90 tablet 1  ? ezetimibe (ZETIA) 10 MG tablet Take 1 tablet (  10 mg total) by mouth daily. 30 tablet 11  ? fluticasone (FLONASE) 50 MCG/ACT nasal spray Place 1 spray into both nostrils daily as needed for allergies or rhinitis.    ? gabapentin (NEURONTIN) 300 MG capsule Take 1 capsule by mouth in the morning, at noon, and at bedtime.    ? hydroxypropyl methylcellulose / hypromellose (ISOPTO TEARS / GONIOVISC) 2.5 % ophthalmic solution Place 1 drop into both eyes 3 (three) times daily as needed for dry eyes.    ? metoprolol succinate (TOPROL-XL) 25 MG 24 hr tablet TAKE 1 TABLET BY MOUTH EVERY DAY 90 tablet 3  ? multivitamin-lutein (OCUVITE-LUTEIN) CAPS capsule Take 1 capsule by mouth daily.    ? omeprazole (PRILOSEC) 40 MG capsule Take 1 capsule (40 mg total) by mouth daily. 90 capsule 3  ? triamterene-hydrochlorothiazide  (DYAZIDE) 37.5-25 MG capsule TAKE 1 CAPSULE BY MOUTH EVERY DAY 90 capsule 3  ? valACYclovir (VALTREX) 1000 MG tablet Take 1,000 mg by mouth 3 (three) times daily.    ? ?No current facility-administered medications on file prior to visit.  ? ?Allergies  ?Allergen Reactions  ? Rosuvastatin Other (See Comments)  ?  Pt reports causes muscle cramps in legs  ? ?Social History  ? ?Socioeconomic History  ? Marital status: Widowed  ?  Spouse name: Not on file  ? Number of children: 1  ? Years of education: Not on file  ? Highest education level: Not on file  ?Occupational History  ? Occupation: Retired   ?Tobacco Use  ? Smoking status: Never  ? Smokeless tobacco: Never  ?Vaping Use  ? Vaping Use: Never used  ?Substance and Sexual Activity  ? Alcohol use: No  ?  Alcohol/week: 0.0 standard drinks  ? Drug use: No  ? Sexual activity: Not on file  ?Other Topics Concern  ? Not on file  ?Social History Narrative  ? He is retired, widowed and has 1 child.  ? He does not use alcohol tobacco or drugs  ? ?Social Determinants of Health  ? ?Financial Resource Strain: Not on file  ?Food Insecurity: Not on file  ?Transportation Needs: Not on file  ?Physical Activity: Not on file  ?Stress: Not on file  ?Social Connections: Not on file  ?Intimate Partner Violence: Not on file  ? ? ? ? ?Review of Systems  ?All other systems reviewed and are negative. ? ?   ?Objective:  ? Physical Exam ?Vitals reviewed.  ?Constitutional:   ?   General: He is not in acute distress. ?   Appearance: He is well-developed. He is not diaphoretic.  ?Eyes:  ?   Conjunctiva/sclera: Conjunctivae normal.  ?   Pupils: Pupils are equal, round, and reactive to light.  ?Neck:  ?   Vascular: No JVD.  ?Cardiovascular:  ?   Rate and Rhythm: Normal rate and regular rhythm.  ?   Heart sounds: Normal heart sounds. No murmur heard. ?Pulmonary:  ?   Effort: Pulmonary effort is normal. No respiratory distress.  ?   Breath sounds: Normal breath sounds. No wheezing or rales.   ?Abdominal:  ?   General: Bowel sounds are normal. There is no distension.  ?   Palpations: Abdomen is soft. There is no mass.  ?   Tenderness: There is no abdominal tenderness. There is no guarding or rebound.  ?Musculoskeletal:  ?   Cervical back: Neck supple.  ?     Legs: ? ?Lymphadenopathy:  ?   Cervical: No cervical adenopathy.  ? ? ? ? ? ?   ?  Assessment & Plan:  ?Benign essential HTN - Plan: CBC with Differential/Platelet, Lipid panel, COMPLETE METABOLIC PANEL WITH GFR ? ?Mixed hyperlipidemia - Plan: CBC with Differential/Platelet, Lipid panel, COMPLETE METABOLIC PANEL WITH GFR ? ?Tick bite of left thigh, subsequent encounter ?I anesthetized the area with 0.1% lidocaine with epinephrine.  I used a 6 mm punch biopsy throughout the entire lesion including the retained foreign material down to the subcutaneous fascia.  This was removed in its entirety.  Drysol was used to achieve hemostasis.  Recommended wound care with Neosporin and Band-Aid.  Discussed the rationale behind checking additional lab studies.  I explained that lab work checks for antibodies.  He is asymptomatic.  Therefore I do not feel an additional check for Lyme disease or [place] spotted fever will be necessary as long as he is asymptomatic also given the fact he has had 20 days of doxycycline.  He is due for fasting lab work.  He can return anytime for this. ?

## 2021-06-28 ENCOUNTER — Other Ambulatory Visit: Payer: Self-pay | Admitting: *Deleted

## 2021-06-28 MED ORDER — AMLODIPINE BESYLATE 5 MG PO TABS: 5.0000 mg | ORAL_TABLET | Freq: Every day | ORAL | 1 refills | Status: DC

## 2021-07-04 ENCOUNTER — Other Ambulatory Visit: Payer: Self-pay | Admitting: *Deleted

## 2021-07-04 MED ORDER — TRIAMTERENE-HCTZ 37.5-25 MG PO CAPS: ORAL_CAPSULE | ORAL | 0 refills | Status: DC

## 2021-08-01 NOTE — Progress Notes (Signed)
Cardiology Office Note:    Date:  08/12/2021   ID:  Joel Bauer, DOB 22-Mar-1933, MRN [id number]  PCP:  Susy Frizzle, MD   [facility] Providers Cardiologist:  Ena Dawley, MD {   Referring MD: Susy Frizzle, MD    History of Present Illness:    Joel Bauer is a 88 y.o. male with a hx of  HTN, HLD intolerant of statins, atypical chest pain with normal nuclear stress test in 2017, mild aortic regurgitation, palpitations, and PVCs controlled on metop who was previously followed by Dr. Meda Coffee who now returns to clinic for annual cardiac follow-up.  Patient was last seen by Dr. Meda Coffee on 01/08/20 where he was doing well and remained active without chest pain or SOB. Holter 01/2017 with 800 PACs and 1200 PVCs with palpitations currently controlled with metoprolol. TTE 06/04/19 with LVEF 60-65% with mild AI unchanged from prior TTE. Nuc 2017 with no evidence of ischemia or infarction.  Was last seen in clinic on 01/2021 where he was doing well. No anginal symptoms.   Today, the patient overall feels well. No chest pain, SOB, orthopnea or PND. Has been tolerating medications without issues. Blood pressure has been running mainly 130s. No dizziness, lightheadedness or syncope. No episodes of low blood pressure. Patient is overall active and is able to walk 1-68mles per day, golfs 3-4x/week. No anginal symptoms with active.   Had shingles and [facility] over the winter time but has since improved.   Past Medical History:  Diagnosis Date   Adenomatous colon polyp 2001   Anemia    as a child    Aortic regurgitation    a. mild by echo 03/2015   Arthritis    [place] [place]    hx of skin cancer    Chest pain 03/31/2015   Diverticulitis large intestine 04/19/2015   Diverticulitis of colon - recurrent/persistent 12/29/2014   Diverticulosis of colon (without mention of hemorrhage) 2005   GERD (gastroesophageal reflux disease)    History of kidney stones     History of skin cancer    Hyperlipidemia    Hypertension    Hypertensive heart disease    LLQ pain 11/26/2014   Macular degeneration    left eye   Orthostasis    Pleurisy    Premature atrial contractions    Prostatitis    PVC's (premature ventricular contractions)    Recurrent colitis due to Clostridium difficile    Retinal hemorrhage    SCC (squamous cell carcinoma) 11/13/2018   RIGHT FOREARM CX3 5FU   Shingles    [place] bowel [place] 05/31/2016   Squamous cell carcinoma of skin 07/23/2017   SCC IN SITU LEFT UPPER NOSE BRIDGE CX3 5FU CAUTERY    Past Surgical History:  Procedure Laterality Date   CATARACTS REMOVED     COLONOSCOPY     COLONOSCOPY N/A 06/15/2014   Procedure: COLONOSCOPY;  Surgeon: CGatha Mayer MD;  [place]  Service: Endoscopy;  Laterality: N/A;   CYSTOSCOPY WITH RETROGRADE PYELOGRAM, URETEROSCOPY AND STENT PLACEMENT Right 10/18/2013   Procedure: CYSTOSCOPY WITH RIGHT RETROGRADE/RIGHT URETEROSCOPY, STONE BASKETRY,  AND STENT PLACEMENT RIGHT;  Surgeon: TAlexis Frock MD;  [facility];  Service: Urology;  Laterality: Right;   EYE SURGERY     3 injections to left eye for macular degeneration    FMT  06/2014   LAPAROSCOPIC LOW ANTERIOR RESECTION N/A 04/19/2015   Procedure: LAPAROSCOPIC ASSISTED  LOW ANTERIOR RESECTION, splenic  flexure mobilization;  Surgeon: Fanny Skates, MD;  Location[facility];  Service: General;  Laterality: N/A;   LITHOTRIPSY     TONSILLECTOMY      Current Medications: Current Meds  Medication Sig   amLODipine (NORVASC) 5 MG tablet Take 1 tablet (5 mg total) by mouth daily.   ezetimibe (ZETIA) 10 MG tablet Take 1 tablet (10 mg total) by mouth daily.   fluticasone (FLONASE) 50 MCG/ACT nasal spray Place 1 spray into both nostrils daily as needed for allergies or rhinitis.   gabapentin (NEURONTIN) 300 MG capsule Take 1 capsule by mouth in the morning, at noon, and at bedtime.   hydroxypropyl methylcellulose /  hypromellose (ISOPTO TEARS / GONIOVISC) 2.5 % ophthalmic solution Place 1 drop into both eyes 3 (three) times daily as needed for dry eyes.   metoprolol succinate (TOPROL-XL) 25 MG 24 hr tablet TAKE 1 TABLET BY MOUTH EVERY DAY   multivitamin-lutein (OCUVITE-LUTEIN) CAPS capsule Take 1 capsule by mouth daily.   omeprazole (PRILOSEC) 40 MG capsule Take 1 capsule (40 mg total) by mouth daily.   triamterene-hydrochlorothiazide (DYAZIDE) 37.5-25 MG capsule TAKE 1 CAPSULE BY MOUTH EVERY DAY   valACYclovir (VALTREX) 1000 MG tablet Take 1,000 mg by mouth 3 (three) times daily.     Allergies:   Rosuvastatin   Social History   Socioeconomic History   Marital status: Widowed    Spouse name: Not on file   Number of children: 1   Years of education: Not on file   Highest education level: Not on file  Occupational History   Occupation: Retired   Tobacco Use   Smoking status: Never   Smokeless tobacco: Never  Vaping Use   Vaping Use: Never used  Substance and Sexual Activity   Alcohol use: No    Alcohol/week: 0.0 standard drinks   Drug use: No   Sexual activity: Not on file  Other Topics Concern   Not on file  Social History Narrative   He is retired, widowed and has 1 child.   He does not use alcohol tobacco or drugs   Social Determinants of Radio broadcast assistant Strain: Not on file  Food Insecurity: Not on file  Transportation Needs: Not on file  Physical Activity: Not on file  Stress: Not on file  Social Connections: Not on file     Family History: The patient's family history includes Breast cancer in his mother; Colon cancer in his father; Colon polyps in his father; Heart disease in his mother; Kidney disease in his sister.  ROS:   Please see the history of present illness.    Review of Systems  Constitutional:  Negative for chills and fever.  HENT:  Negative for sore throat.   Eyes:  Negative for blurred vision.  Respiratory:  Negative for shortness of breath.    Cardiovascular:  Negative for chest pain, palpitations, orthopnea, claudication, leg swelling and PND.  Gastrointestinal:  Negative for nausea and vomiting.  Genitourinary:  Negative for flank pain.  Neurological:  Negative for dizziness and loss of consciousness.    EKGs/Labs/Other Studies Reviewed:    The following studies were reviewed today: TTE Jun 23, 2019: IMPRESSIONS   1. Left ventricular ejection fraction, by estimation, is 60 to 65%. The  left ventricle has normal function. The left ventricle has no regional  wall motion abnormalities. There is mild left ventricular hypertrophy.  Left ventricular diastolic parameters  are consistent with Grade I diastolic dysfunction (impaired relaxation).  The average left ventricular  global longitudinal strain is -21.8 %.   2. Right ventricular systolic function is normal. The right ventricular  size is normal.   3. The mitral valve is abnormal. Trivial mitral valve regurgitation.   4. The aortic valve has been repaired/replaced. Aortic valve  regurgitation is mild. Mild aortic valve sclerosis is present, with no  evidence of aortic valve stenosis.   Comparison(s): No significant change from prior study. 04/12/15: LVEF  60-65%, mild AI.  Holter 2018: Sinus bradycardia to sinus rhythm. Frequent PACs and PVC. 1 run of atrial tachycardia lasting 4 beats.   Frequent PACs and PVCs but no significant arrhythmias.  Nuc 2017: Nuclear stress EF: 61%. Blood pressure demonstrated a hypertensive response to exercise. There was no ST segment deviation noted during stress. The study is normal. This is a low risk study. The left ventricular ejection fraction is normal (55-65%).  EKG:  EKG not performed today  Recent Labs: 09/02/2020: ALT 43; BUN 21; Creat 1.22; Hemoglobin 16.5; Magnesium 2.2; Platelets 182; Potassium 4.0; Sodium 140  Recent Lipid Panel    Component Value Date/Time   CHOL 160 09/02/2020 1059   CHOL 191 04/20/2020 0931   TRIG  179 (H) 09/02/2020 1059   HDL 43 09/02/2020 1059   HDL 42 04/20/2020 0931   CHOLHDL 3.7 09/02/2020 1059   LDLCALC 89 09/02/2020 1059     Risk Assessment/Calculations:           Physical Exam:    VS:  BP 138/78   Pulse 66   Ht '5\' 11"'$  (1.803 m)   Wt 178 lb (80.7 kg)   SpO2 95%   BMI 24.83 kg/m     Wt Readings from Last 3 Encounters:  08/12/21 178 lb (80.7 kg)  06/13/21 180 lb (81.6 kg)  02/07/21 177 lb (80.3 kg)     GEN:  Comfortable, NAD HEENT: Normal NECK: No JVD; No carotid bruits CARDIAC: RRR, 1/6 systolic murmur. No rubs, gallops RESPIRATORY:  CTAB ABDOMEN: Soft, non-tender, non-distended MUSCULOSKELETAL:  No edema; No deformity  SKIN: Warm and dry NEUROLOGIC:  Alert and oriented x 3 PSYCHIATRIC:  Normal affect   ASSESSMENT:    1. Essential hypertension   2. Medication management   3. Hyperlipidemia, unspecified hyperlipidemia type   4. Mixed hyperlipidemia   5. PVC's (premature ventricular contractions)   6. Mild aortic insufficiency    PLAN:    In order of problems listed above:  #HTN: Controlled and at goal at home. -Continue metop '25mg'$  XL daily -Continue amlodipine '5mg'$  daily -Continue triamterene-HCTZ 37.5-'25mg'$  daily  #HLD: #Statin Intolerance: Did not tolerate statins due to significant muscle cramps. Specifically did not tolerate prava or rosuva. Followed by lipid clinic and now on zetia. -Continue zetia '10mg'$  daily -LDL 89; goal<100 in 08/2020 -Check lipid panel when fasting  #Mild AI: Stable on TTE in 05/2019.  -Repeat TTE in 2024  #PACs/PVCs: Controlled on metoprolol. -Continue metop '25mg'$  daily      Medication Adjustments/Labs and Tests Ordered: Current medicines are reviewed at length with the patient today.  Concerns regarding medicines are outlined above.  Orders Placed This Encounter  Procedures   Lipid Profile   No orders of the defined types were placed in this encounter.   Patient Instructions  Medication  Instructions:   Your physician recommends that you continue on your current medications as directed. Please refer to the Current Medication list given to you today.  *If you need a refill on your cardiac medications before your next appointment, please  call your pharmacy*   Lab Work:  IN THE NEXT WEEK OR SO--CHECK LIPIDS AT THAT TIME--PLEASE COME FASTING TO THIS LAB APPOINTMENT  If you have labs (blood work) drawn today and your tests are completely normal, you will receive your results only by: [place] (if you have MyChart) OR A paper copy in the mail If you have any lab test that is abnormal or we need to change your treatment, we will call you to review the results.   Follow-Up: At [facility], you and your health needs are our priority.  As part of our continuing mission to provide you with exceptional heart care, we have created designated Provider Care Teams.  These Care Teams include your primary Cardiologist (physician) and Advanced Practice Providers (APPs -  Physician Assistants and Nurse Practitioners) who all work together to provide you with the care you need, when you need it.  We recommend signing up for the patient portal called "MyChart".  Sign up information is provided on this After Visit Summary.  MyChart is used to connect with patients for Virtual Visits (Telemedicine).  Patients are able to view lab/test results, encounter notes, upcoming appointments, etc.  Non-urgent messages can be sent to your provider as well.   To learn more about what you can do with MyChart, go to [email or web address].    Your next appointment:   6 month(s)  The format for your next appointment:   In Person  Provider:   DR. Johney Frame  Important Information About Sugar         Signed, Freada Bergeron, MD  08/12/2021 12:01 PM    [place]

## 2021-08-12 ENCOUNTER — Ambulatory Visit: Payer: Medicare Other | Admitting: Cardiology

## 2021-08-12 ENCOUNTER — Encounter: Payer: Self-pay | Admitting: Cardiology

## 2021-08-12 VITALS — BP 138/78 | HR 66 | Ht 71.0 in | Wt 178.0 lb

## 2021-08-12 DIAGNOSIS — I10 Essential (primary) hypertension: Secondary | ICD-10-CM | POA: Diagnosis not present

## 2021-08-12 DIAGNOSIS — I493 Ventricular premature depolarization: Secondary | ICD-10-CM

## 2021-08-12 DIAGNOSIS — Z79899 Other long term (current) drug therapy: Secondary | ICD-10-CM

## 2021-08-12 DIAGNOSIS — E782 Mixed hyperlipidemia: Secondary | ICD-10-CM

## 2021-08-12 DIAGNOSIS — I351 Nonrheumatic aortic (valve) insufficiency: Secondary | ICD-10-CM

## 2021-08-12 DIAGNOSIS — E785 Hyperlipidemia, unspecified: Secondary | ICD-10-CM | POA: Diagnosis not present

## 2021-08-12 NOTE — Patient Instructions (Signed)
Medication Instructions:   Your physician recommends that you continue on your current medications as directed. Please refer to the Current Medication list given to you today.  *If you need a refill on your cardiac medications before your next appointment, please call your pharmacy*   Lab Work:  IN THE NEXT WEEK OR SO--CHECK LIPIDS AT THAT TIME--PLEASE COME FASTING TO THIS LAB APPOINTMENT  If you have labs (blood work) drawn today and your tests are completely normal, you will receive your results only by: [place] (if you have MyChart) OR A paper copy in the mail If you have any lab test that is abnormal or we need to change your treatment, we will call you to review the results.   Follow-Up: At [facility], you and your health needs are our priority.  As part of our continuing mission to provide you with exceptional heart care, we have created designated Provider Care Teams.  These Care Teams include your primary Cardiologist (physician) and Advanced Practice Providers (APPs -  Physician Assistants and Nurse Practitioners) who all work together to provide you with the care you need, when you need it.  We recommend signing up for the patient portal called "MyChart".  Sign up information is provided on this After Visit Summary.  MyChart is used to connect with patients for Virtual Visits (Telemedicine).  Patients are able to view lab/test results, encounter notes, upcoming appointments, etc.  Non-urgent messages can be sent to your provider as well.   To learn more about what you can do with MyChart, go to [email or web address].    Your next appointment:   6 month(s)  The format for your next appointment:   In Person  Provider:   DR. Johney Frame  Important Information About Sugar

## 2021-08-24 ENCOUNTER — Other Ambulatory Visit: Payer: Medicare Other

## 2021-08-24 DIAGNOSIS — Z79899 Other long term (current) drug therapy: Secondary | ICD-10-CM

## 2021-08-24 DIAGNOSIS — E782 Mixed hyperlipidemia: Secondary | ICD-10-CM

## 2021-08-24 DIAGNOSIS — E785 Hyperlipidemia, unspecified: Secondary | ICD-10-CM

## 2021-08-24 LAB — LIPID PANEL
Chol/HDL Ratio: 4 ratio (ref 0.0–5.0)
Cholesterol, Total: 159 mg/dL (ref 100–199)
HDL: 40 mg/dL (ref >39)
LDL Chol Calc (NIH): 90 mg/dL (ref 0–99)
Triglycerides: 165 mg/dL — ABNORMAL HIGH (ref 0–149)
VLDL Cholesterol Cal: 29 mg/dL (ref 5–40)

## 2021-09-23 ENCOUNTER — Other Ambulatory Visit: Payer: Medicare Other

## 2021-10-03 ENCOUNTER — Other Ambulatory Visit: Payer: Self-pay

## 2021-10-03 MED ORDER — TRIAMTERENE-HCTZ 37.5-25 MG PO CAPS: ORAL_CAPSULE | ORAL | 2 refills | Status: DC

## 2021-12-27 ENCOUNTER — Other Ambulatory Visit: Payer: Self-pay

## 2021-12-27 MED ORDER — AMLODIPINE BESYLATE 5 MG PO TABS
5.0000 mg | ORAL_TABLET | Freq: Every day | ORAL | 2 refills | Status: DC
Start: 2021-12-27 — End: 2022-10-02

## 2022-01-08 ENCOUNTER — Other Ambulatory Visit: Payer: Self-pay | Admitting: Family Medicine

## 2022-01-08 DIAGNOSIS — R002 Palpitations: Secondary | ICD-10-CM

## 2022-01-11 ENCOUNTER — Other Ambulatory Visit: Payer: Self-pay | Admitting: Family Medicine

## 2022-01-11 DIAGNOSIS — R002 Palpitations: Secondary | ICD-10-CM

## 2022-02-02 ENCOUNTER — Telehealth: Payer: Self-pay | Admitting: Family Medicine

## 2022-02-02 NOTE — Telephone Encounter (Signed)
Left message for patient to call back and schedule Medicare Annual Wellness Visit (AWV) either virtually or in office. Left  my Herbie Drape number [phone]   Last AWV 09/02/20 please schedule with [place]   45 min for awv-i and in office appointments 30 min for awv-s  phone/virtual appointments

## 2022-02-18 NOTE — Progress Notes (Unsigned)
Cardiology Office Note:    Date:  02/18/2022   ID:  Joel Bauer, DOB May 02, 1933, MRN [id number]  PCP:  Joel Frizzle, MD   [facility] Providers Cardiologist:  Joel Dawley, MD {   Referring MD: Joel Frizzle, MD    History of Present Illness:    Joel Bauer is a 88 y.o. male with a hx of  HTN, HLD intolerant of statins, atypical chest pain with normal nuclear stress test in 2017, mild aortic regurgitation, palpitations, and PVCs controlled on metop who was previously followed by Dr. Meda Bauer who now returns to clinic for annual cardiac follow-up.  Patient was last seen by Dr. Meda Bauer on 01/08/20 where he was doing well and remained active without chest pain or SOB. Holter 01/2017 with 800 PACs and 1200 PVCs with palpitations currently controlled with metoprolol. TTE 06/04/19 with LVEF 60-65% with mild AI unchanged from prior TTE. Nuc 2017 with no evidence of ischemia or infarction.  Was last seen in clinic on 01/2021 where he was doing well. No anginal symptoms.   Today, the patient overall feels well. No chest pain, SOB, orthopnea or PND. Has been tolerating medications without issues. Blood pressure has been running mainly 130s. No dizziness, lightheadedness or syncope. No episodes of low blood pressure. Patient is overall active and is able to walk 1-75mles per day, golfs 3-4x/week. No anginal symptoms with active.   [facility] time but has since improved.   Past Medical History:  Diagnosis Date   Adenomatous colon polyp 2001   Anemia    as a child    Aortic regurgitation    a. mild by echo 03/2015   Arthritis    Cancer [place]    hx of skin cancer    Chest pain 03/31/2015   Diverticulitis large intestine 04/19/2015   Diverticulitis of colon - recurrent/persistent 12/29/2014   Diverticulosis of colon (without mention of hemorrhage) 2005   GERD (gastroesophageal reflux disease)    History of kidney stones     History of skin cancer    Hyperlipidemia    Hypertension    Hypertensive heart disease    LLQ pain 11/26/2014   Macular degeneration    left eye   Orthostasis    Pleurisy    Premature atrial contractions    Prostatitis    PVC's (premature ventricular contractions)    Recurrent colitis due to Clostridium difficile    Retinal hemorrhage    SCC (squamous cell carcinoma) 11/13/2018   RIGHT FOREARM CX3 5FU   Shingles    [place] bowel obstruction ([place] 05/31/2016   Squamous cell carcinoma of skin 07/23/2017   SCC IN SITU LEFT UPPER NOSE BRIDGE CX3 5FU CAUTERY    Past Surgical History:  Procedure Laterality Date   CATARACTS REMOVED     COLONOSCOPY     COLONOSCOPY N/A 06/15/2014   Procedure: COLONOSCOPY;  Surgeon: CGatha Mayer MD;  [place]  Service: Endoscopy;  Laterality: N/A;   CYSTOSCOPY WITH RETROGRADE PYELOGRAM, URETEROSCOPY AND STENT PLACEMENT Right 10/18/2013   Procedure: CYSTOSCOPY WITH RIGHT RETROGRADE/RIGHT URETEROSCOPY, STONE BASKETRY,  AND STENT PLACEMENT RIGHT;  Surgeon: TAlexis Frock MD;  [facility];  Service: Urology;  Laterality: Right;   EYE SURGERY     3 injections to left eye for macular degeneration    FMT  06/2014   LAPAROSCOPIC LOW ANTERIOR RESECTION N/A 04/19/2015   Procedure: LAPAROSCOPIC ASSISTED  LOW ANTERIOR RESECTION, splenic  flexure mobilization;  Surgeon: Joel Skates, MD;  Location: [facility];  Service: General;  Laterality: N/A;   LITHOTRIPSY     TONSILLECTOMY      Current Medications: No outpatient medications have been marked as taking for the 02/22/22 encounter (Appointment) with Freada Bergeron, MD.     Allergies:   Rosuvastatin   Social History   Socioeconomic History   Marital status: Widowed    Spouse name: Not on file   Number of children: 1   Years of education: Not on file   Highest education level: Not on file  Occupational History   Occupation: Retired   Tobacco Use   Smoking status: Never   Smokeless  tobacco: Never  Vaping Use   Vaping Use: Never used  Substance and Sexual Activity   Alcohol use: No    Alcohol/week: 0.0 standard drinks of alcohol   Drug use: No   Sexual activity: Not on file  Other Topics Concern   Not on file  Social History Narrative   He is retired, widowed and has 1 child.   He does not use alcohol tobacco or drugs   Social Determinants of Radio broadcast assistant Strain: Not on file  Food Insecurity: Not on file  Transportation Needs: Not on file  Physical Activity: Not on file  Stress: Not on file  Social Connections: Not on file     Family History: The patient's family history includes Breast cancer in his mother; Colon cancer in his father; Colon polyps in his father; Heart disease in his mother; Kidney disease in his sister.  ROS:   Please see the history of present illness.    Review of Systems  Constitutional:  Negative for chills and fever.  HENT:  Negative for sore throat.   Eyes:  Negative for blurred vision.  Respiratory:  Negative for shortness of breath.   Cardiovascular:  Negative for chest pain, palpitations, orthopnea, claudication, leg swelling and PND.  Gastrointestinal:  Negative for nausea and vomiting.  Genitourinary:  Negative for flank pain.  Neurological:  Negative for dizziness and loss of consciousness.     EKGs/Labs/Other Studies Reviewed:    The following studies were reviewed today: TTE June 09, 2019: IMPRESSIONS   1. Left ventricular ejection fraction, by estimation, is 60 to 65%. The  left ventricle has normal function. The left ventricle has no regional  wall motion abnormalities. There is mild left ventricular hypertrophy.  Left ventricular diastolic parameters  are consistent with Grade I diastolic dysfunction (impaired relaxation).  The average left ventricular global longitudinal strain is -21.8 %.   2. Right ventricular systolic function is normal. The right ventricular  size is normal.   3. The mitral  valve is abnormal. Trivial mitral valve regurgitation.   4. The aortic valve has been repaired/replaced. Aortic valve  regurgitation is mild. Mild aortic valve sclerosis is present, with no  evidence of aortic valve stenosis.   Comparison(s): No significant change from prior study. 04/12/15: LVEF  60-65%, mild AI.  Holter 2018: Sinus bradycardia to sinus rhythm. Frequent PACs and PVC. 1 run of atrial tachycardia lasting 4 beats.   Frequent PACs and PVCs but no significant arrhythmias.  Nuc 2017: Nuclear stress EF: 61%. Blood pressure demonstrated a hypertensive response to exercise. There was no ST segment deviation noted during stress. The study is normal. This is a low risk study. The left ventricular ejection fraction is normal (55-65%).  EKG:  EKG not performed today  Recent Labs: No  results found for requested labs within last 365 days.  Recent Lipid Panel    Component Value Date/Time   CHOL 159 08/24/2021 0928   TRIG 165 (H) 08/24/2021 0928   HDL 40 08/24/2021 0928   CHOLHDL 4.0 08/24/2021 0928   CHOLHDL 3.7 09/02/2020 1059   LDLCALC 90 08/24/2021 0928   LDLCALC 89 09/02/2020 1059     Risk Assessment/Calculations:           Physical Exam:    VS:  There were no vitals taken for this visit.    Wt Readings from Last 3 Encounters:  08/12/21 178 lb (80.7 kg)  06/13/21 180 lb (81.6 kg)  02/07/21 177 lb (80.3 kg)     GEN:  Comfortable, NAD HEENT: Normal NECK: No JVD; No carotid bruits CARDIAC: RRR, 1/6 systolic murmur. No rubs, gallops RESPIRATORY:  CTAB ABDOMEN: Soft, non-tender, non-distended MUSCULOSKELETAL:  No edema; No deformity  SKIN: Warm and dry NEUROLOGIC:  Alert and oriented x 3 PSYCHIATRIC:  Normal affect   ASSESSMENT:    No diagnosis found.  PLAN:    In order of problems listed above:  #HTN: Controlled and at goal at home. -Continue metop '25mg'$  XL daily -Continue amlodipine '5mg'$  daily -Continue triamterene-HCTZ 37.5-'25mg'$   daily  #HLD: #Statin Intolerance: Did not tolerate statins due to significant muscle cramps. Specifically did not tolerate prava or rosuva. Followed by lipid clinic and now on zetia. -Continue zetia '10mg'$  daily -LDL 89; goal<100 in 08/2020 -Check lipid panel when fasting  #Mild AI: Stable on TTE in 05/2019.  -Repeat TTE in 2024  #PACs/PVCs: Controlled on metoprolol. -Continue metop '25mg'$  daily      Medication Adjustments/Labs and Tests Ordered: Current medicines are reviewed at length with the patient today.  Concerns regarding medicines are outlined above.  No orders of the defined types were placed in this encounter.  No orders of the defined types were placed in this encounter.   There are no Patient Instructions on file for this visit.    Signed, Freada Bergeron, MD  02/18/2022 1:55 PM    [place]

## 2022-02-22 ENCOUNTER — Ambulatory Visit: Payer: Medicare Other | Attending: Cardiology | Admitting: Cardiology

## 2022-02-22 ENCOUNTER — Encounter: Payer: Self-pay | Admitting: Cardiology

## 2022-02-22 VITALS — BP 138/62 | HR 62 | Ht 71.0 in | Wt 179.2 lb

## 2022-02-22 DIAGNOSIS — I493 Ventricular premature depolarization: Secondary | ICD-10-CM

## 2022-02-22 DIAGNOSIS — E782 Mixed hyperlipidemia: Secondary | ICD-10-CM

## 2022-02-22 DIAGNOSIS — I10 Essential (primary) hypertension: Secondary | ICD-10-CM

## 2022-02-22 DIAGNOSIS — I351 Nonrheumatic aortic (valve) insufficiency: Secondary | ICD-10-CM

## 2022-02-22 NOTE — Progress Notes (Signed)
Cardiology Office Note:    Date:  02/22/2022   ID:  Joel Bauer, DOB 1933/10/01, MRN [id number]  PCP:  Joel Frizzle, MD   [facility] Providers Cardiologist:  Joel Dawley, MD {   Referring MD: Joel Frizzle, MD    History of Present Illness:    Joel Bauer is a 88 y.o. male with a hx of  HTN, HLD intolerant of statins, atypical chest pain with normal nuclear stress test in 2017, mild aortic regurgitation, palpitations, and PVCs controlled on metop who was previously followed by Dr. Meda Bauer who now returns to clinic for annual cardiac follow-up.  Patient was last seen by Dr. Meda Bauer on 01/08/20 where he was doing well and remained active without chest pain or SOB. Holter 01/2017 with 800 PACs and 1200 PVCs with palpitations currently controlled with metoprolol. TTE 06/04/19 with LVEF 60-65% with mild AI unchanged from prior TTE. Nuc 2017 with no evidence of ischemia or infarction.  Was last seen in clinic on 07/2021 where he was doing well from a CV standpoint.  Today, he overall feels well. He is doing great from a CV perspective.  He denies any palpitations, chest pain, or peripheral edema. No lightheadedness, headaches, syncope, orthopnea, or PND.  He states that the only time he becomes short of breath is when he goes up hills during his walks.   His at home blood pressures are typically around the 701X and 793J systolic. Tolerating medications as prescribed.   Past Medical History:  Diagnosis Date   Adenomatous colon polyp 2001   Anemia    as a child    Aortic regurgitation    a. mild by echo 03/2015   Arthritis    Cancer ([place])    hx of skin cancer    Chest pain 03/31/2015   Diverticulitis large intestine 04/19/2015   Diverticulitis of colon - recurrent/persistent 12/29/2014   Diverticulosis of colon (without mention of hemorrhage) 2005   GERD (gastroesophageal reflux disease)    History of kidney stones    History of skin cancer    Hyperlipidemia     Hypertension    Hypertensive heart disease    LLQ pain 11/26/2014   Macular degeneration    left eye   Orthostasis    Pleurisy    Premature atrial contractions    Prostatitis    PVC's (premature ventricular contractions)    Recurrent colitis due to Clostridium difficile    Retinal hemorrhage    SCC (squamous cell carcinoma) 11/13/2018   RIGHT FOREARM CX3 5FU   Shingles    Small bowel obstruction ([place]) 05/31/2016   Squamous cell carcinoma of skin 07/23/2017   SCC IN SITU LEFT UPPER NOSE BRIDGE CX3 5FU CAUTERY    Past Surgical History:  Procedure Laterality Date   CATARACTS REMOVED     COLONOSCOPY     COLONOSCOPY N/A 06/15/2014   Procedure: COLONOSCOPY;  Surgeon: Gatha Mayer, MD;  Location: [facility];  Service: Endoscopy;  Laterality: N/A;   CYSTOSCOPY WITH RETROGRADE PYELOGRAM, URETEROSCOPY AND STENT PLACEMENT Right 10/18/2013   Procedure: CYSTOSCOPY WITH RIGHT RETROGRADE/RIGHT URETEROSCOPY, STONE BASKETRY,  AND STENT PLACEMENT RIGHT;  Surgeon: Alexis Frock, MD;  [facility];  Service: Urology;  Laterality: Right;   EYE SURGERY     3 injections to left eye for macular degeneration    FMT  06/2014   LAPAROSCOPIC LOW ANTERIOR RESECTION N/A 04/19/2015   Procedure: LAPAROSCOPIC ASSISTED  LOW ANTERIOR RESECTION, splenic flexure mobilization;  Surgeon: Fanny Skates, MD;  [facility];  Service: General;  Laterality: N/A;   LITHOTRIPSY     TONSILLECTOMY      Current Medications: Current Meds  Medication Sig   amLODipine (NORVASC) 5 MG tablet Take 1 tablet (5 mg total) by mouth daily.   fluticasone (FLONASE) 50 MCG/ACT nasal spray Place 1 spray into both nostrils daily as needed for allergies or rhinitis.   hydroxypropyl methylcellulose / hypromellose (ISOPTO TEARS / GONIOVISC) 2.5 % ophthalmic solution Place 1 drop into both eyes 3 (three) times daily as needed for dry eyes.   metoprolol succinate (TOPROL-XL) 25 MG 24 hr tablet TAKE 1 TABLET BY MOUTH EVERY DAY    multivitamin-lutein (OCUVITE-LUTEIN) CAPS capsule Take 1 capsule by mouth daily.   omeprazole (PRILOSEC) 40 MG capsule Take 1 capsule (40 mg total) by mouth daily.   triamterene-hydrochlorothiazide (DYAZIDE) 37.5-25 MG capsule TAKE 1 CAPSULE BY MOUTH EVERY DAY     Allergies:   Rosuvastatin   Social History   Socioeconomic History   Marital status: Widowed    Spouse name: Not on file   Number of children: 1   Years of education: Not on file   Highest education level: Not on file  Occupational History   Occupation: Retired   Tobacco Use   Smoking status: Never   Smokeless tobacco: Never  Vaping Use   Vaping Use: Never used  Substance and Sexual Activity   Alcohol use: No    Alcohol/week: 0.0 standard drinks of alcohol   Drug use: No   Sexual activity: Not on file  Other Topics Concern   Not on file  Social History Narrative   He is retired, widowed and has 1 child.   He does not use alcohol tobacco or drugs   Social Determinants of Radio broadcast assistant Strain: Not on file  Food Insecurity: Not on file  Transportation Needs: Not on file  Physical Activity: Not on file  Stress: Not on file  Social Connections: Not on file     Family History: The patient's family history includes Breast cancer in his mother; Colon cancer in his father; Colon polyps in his father; Heart disease in his mother; Kidney disease in his sister.  ROS:   Please see the history of present illness.    Review of Systems  Constitutional:  Negative for chills and fever.  HENT:  Negative for sore throat.   Eyes:  Negative for blurred vision.  Respiratory:  Negative for shortness of breath.   Cardiovascular:  Negative for chest pain, palpitations, orthopnea, claudication, leg swelling and PND.  Gastrointestinal:  Negative for nausea and vomiting.  Genitourinary:  Negative for flank pain.  Neurological:  Negative for dizziness and loss of consciousness.     EKGs/Labs/Other Studies  Reviewed:    The following studies were reviewed today:  TTE 06/04/19: IMPRESSIONS   1. Left ventricular ejection fraction, by estimation, is 60 to 65%. The  left ventricle has normal function. The left ventricle has no regional  wall motion abnormalities. There is mild left ventricular hypertrophy.  Left ventricular diastolic parameters  are consistent with Grade I diastolic dysfunction (impaired relaxation).  The average left ventricular global longitudinal strain is -21.8 %.   2. Right ventricular systolic function is normal. The right ventricular  size is normal.   3. The mitral valve is abnormal. Trivial mitral valve regurgitation.   4. The aortic valve has been repaired/replaced. Aortic valve  regurgitation is mild. Mild aortic  valve sclerosis is present, with no  evidence of aortic valve stenosis.   Comparison(s): No significant change from prior study. 04/12/15: LVEF  60-65%, mild AI.  Holter 2018: Sinus bradycardia to sinus rhythm. Frequent PACs and PVC. 1 run of atrial tachycardia lasting 4 beats.   Frequent PACs and PVCs but no significant arrhythmias.  Nuc 2017: Nuclear stress EF: 61%. Blood pressure demonstrated a hypertensive response to exercise. There was no ST segment deviation noted during stress. The study is normal. This is a low risk study. The left ventricular ejection fraction is normal (55-65%).   EKG: EKG is personally reviewed. 02/22/22: NSR. Rate 62 bpm. Inferior Q waves.   Recent Labs: No results found for requested labs within last 365 days.  Recent Lipid Panel    Component Value Date/Time   CHOL 159 08/24/2021 0928   TRIG 165 (H) 08/24/2021 0928   HDL 40 08/24/2021 0928   CHOLHDL 4.0 08/24/2021 0928   CHOLHDL 3.7 09/02/2020 1059   LDLCALC 90 08/24/2021 0928   LDLCALC 89 09/02/2020 1059     Risk Assessment/Calculations:           Physical Exam:    VS:  BP 138/62   Pulse 62   Ht '5\' 11"'$  (1.803 m)   Wt 179 lb 3.2 oz (81.3 kg)    SpO2 98%   BMI 24.99 kg/m     Wt Readings from Last 3 Encounters:  02/22/22 179 lb 3.2 oz (81.3 kg)  08/12/21 178 lb (80.7 kg)  06/13/21 180 lb (81.6 kg)     GEN:  Comfortable, well appearing HEENT: Normal NECK: No JVD; No carotid bruits CARDIAC: RRR, 1/6 systolic murmur. No rubs, gallops RESPIRATORY:  CTAB ABDOMEN: Soft, non-tender, non-distended MUSCULOSKELETAL:  No edema; No deformity  SKIN: Warm and dry NEUROLOGIC:  Alert and oriented x 3 PSYCHIATRIC:  Normal affect   ASSESSMENT:    1. Essential hypertension   2. Aortic valve insufficiency, etiology of cardiac valve disease unspecified   3. PVC's (premature ventricular contractions)   4. Mild aortic insufficiency   5. Mixed hyperlipidemia    PLAN:    In order of problems listed above:  #HTN: Controlled and at goal at home. -Continue metop '25mg'$  XL daily -Continue amlodipine '5mg'$  daily -Continue triamterene-HCTZ 37.5-'25mg'$  daily   #HLD: #Statin Intolerance: Did not tolerate statins due to significant muscle cramps. Specifically did not tolerate prava or rosuva. Followed by lipid clinic and now on zetia. -Continue zetia '10mg'$  daily -LDL 89; goal<100 in 08/2020 -Check lipid panel when fasting   #Mild AI: Stable on TTE in 05/2019.  -Repeat TTE in 2024   #PACs/PVCs: Controlled on metoprolol. -Continue metop '25mg'$  daily        Follow up: 6 months.  Medication Adjustments/Labs and Tests Ordered: Current medicines are reviewed at length with the patient today.  Concerns regarding medicines are outlined above.  Orders Placed This Encounter  Procedures   EKG 12-Lead   ECHOCARDIOGRAM COMPLETE   No orders of the defined types were placed in this encounter.   Patient Instructions  Medication Instructions:   Your physician recommends that you continue on your current medications as directed. Please refer to the Current Medication list given to you today.  *If you need a refill on your cardiac medications  before your next appointment, please call your pharmacy*   Testing/Procedures:  Your physician has requested that you have an echocardiogram. Echocardiography is a painless test that uses sound waves to create images of your  heart. It provides your doctor with information about the size and shape of your heart and how well your heart's chambers and valves are working. This procedure takes approximately one hour. There are no restrictions for this procedure. SCHEDULE ECHO TO BE DONE IN MARCH 2024 PER DR. Johney Frame   Please do NOT wear cologne, perfume, aftershave, or lotions (deodorant is allowed). Please arrive 15 minutes prior to your appointment time.    Follow-Up: At [facility], you and your health needs are our priority.  As part of our continuing mission to provide you with exceptional heart care, we have created designated Provider Care Teams.  These Care Teams include your primary Cardiologist (physician) and Advanced Practice Providers (APPs -  Physician Assistants and Nurse Practitioners) who all work together to provide you with the care you need, when you need it.  We recommend signing up for the patient portal called "MyChart".  Sign up information is provided on this After Visit Summary.  MyChart is used to connect with patients for Virtual Visits (Telemedicine).  Patients are able to view lab/test results, encounter notes, upcoming appointments, etc.  Non-urgent messages can be sent to your provider as well.   To learn more about what you can do with MyChart, go to [email or web address].    Your next appointment:   6 month(s)  The format for your next appointment:   In Person  Provider:   DR. Johney Frame   Important Information About [place]         I,Breanna Adamick,acting as a scribe for Freada Bergeron, MD.,have documented all relevant documentation on the behalf of Freada Bergeron, MD,as directed by  Freada Bergeron, MD while in the presence  of Freada Bergeron, MD.  I, Freada Bergeron, MD, have reviewed all documentation for this visit. The documentation on 02/22/22 for the exam, diagnosis, procedures, and orders are all accurate and complete.   Signed, Freada Bergeron, MD  02/22/2022 11:03 AM    [place]

## 2022-02-22 NOTE — Patient Instructions (Signed)
Medication Instructions:   Your physician recommends that you continue on your current medications as directed. Please refer to the Current Medication list given to you today.  *If you need a refill on your cardiac medications before your next appointment, please call your pharmacy*   Testing/Procedures:  Your physician has requested that you have an echocardiogram. Echocardiography is a painless test that uses sound waves to create images of your heart. It provides your doctor with information about the size and shape of your heart and how well your heart's chambers and valves are working. This procedure takes approximately one hour. There are no restrictions for this procedure. SCHEDULE ECHO TO BE DONE IN MARCH 2024 PER DR. Johney Frame   Please do NOT wear cologne, perfume, aftershave, or lotions (deodorant is allowed). Please arrive 15 minutes prior to your appointment time.    Follow-Up: At [facility], you and your health needs are our priority.  As part of our continuing mission to provide you with exceptional heart care, we have created designated Provider Care Teams.  These Care Teams include your primary Cardiologist (physician) and Advanced Practice Providers (APPs -  Physician Assistants and Nurse Practitioners) who all work together to provide you with the care you need, when you need it.  We recommend signing up for the patient portal called "MyChart".  Sign up information is provided on this After Visit Summary.  MyChart is used to connect with patients for Virtual Visits (Telemedicine).  Patients are able to view lab/test results, encounter notes, upcoming appointments, etc.  Non-urgent messages can be sent to your provider as well.   To learn more about what you can do with MyChart, go to [email or web address].    Your next appointment:   6 month(s)  The format for your next appointment:   In Person  Provider:   DR. Johney Frame   Important Information About  [place]

## 2022-03-21 DIAGNOSIS — R208 Other disturbances of skin sensation: Secondary | ICD-10-CM | POA: Diagnosis not present

## 2022-03-21 DIAGNOSIS — L570 Actinic keratosis: Secondary | ICD-10-CM | POA: Diagnosis not present

## 2022-03-21 DIAGNOSIS — L820 Inflamed seborrheic keratosis: Secondary | ICD-10-CM | POA: Diagnosis not present

## 2022-03-21 DIAGNOSIS — D1801 Hemangioma of skin and subcutaneous tissue: Secondary | ICD-10-CM | POA: Diagnosis not present

## 2022-03-21 DIAGNOSIS — L821 Other seborrheic keratosis: Secondary | ICD-10-CM | POA: Diagnosis not present

## 2022-03-21 DIAGNOSIS — Z789 Other specified health status: Secondary | ICD-10-CM | POA: Diagnosis not present

## 2022-03-21 DIAGNOSIS — L814 Other melanin hyperpigmentation: Secondary | ICD-10-CM | POA: Diagnosis not present

## 2022-04-03 ENCOUNTER — Ambulatory Visit: Payer: Medicare Other | Admitting: Dermatology

## 2022-04-04 ENCOUNTER — Ambulatory Visit (INDEPENDENT_AMBULATORY_CARE_PROVIDER_SITE_OTHER): Payer: Medicare Other

## 2022-04-04 VITALS — BP 128/72 | Ht 71.0 in | Wt 179.6 lb

## 2022-04-04 DIAGNOSIS — Z0000 Encounter for general adult medical examination without abnormal findings: Secondary | ICD-10-CM

## 2022-04-04 NOTE — Patient Instructions (Addendum)
[name] , Thank you for taking time to come for your Medicare Wellness Visit. I appreciate your ongoing commitment to your health goals. Please review the following plan we discussed and let me know if I can assist you in the future.   These are the goals we discussed:  Goals      DIET - INCREASE WATER INTAKE        This is a list of the screening recommended for you and due dates:  Health Maintenance  Topic Date Due   Zoster (Shingles) Vaccine (1 of 2) Never done   DTaP/Tdap/Td vaccine (2 - Tdap) 11/09/2013   Flu Shot  10/18/2021   COVID-19 Vaccine (4 - 2023-24 season) 11/18/2021   Medicare Annual Wellness Visit  04/05/2023   Pneumonia Vaccine  Completed   HPV Vaccine  Aged Out    Advanced directives: Please bring a copy of your health care power of attorney and living will to the office to be added to your chart at your convenience.   Conditions/risks identified: Aim for 30 minutes of exercise or brisk walking, 6-8 glasses of water, and 5 servings of fruits and vegetables each day.   Next appointment: Follow up in one year for your annual wellness visit.   Preventive Care 100 Years and Older, Male  Preventive care refers to lifestyle choices and visits with your health care provider that can promote health and wellness. What does preventive care include? A yearly physical exam. This is also called an annual well check. Dental exams once or twice a year. Routine eye exams. Ask your health care provider how often you should have your eyes checked. Personal lifestyle choices, including: Daily care of your teeth and gums. Regular physical activity. Eating a healthy diet. Avoiding tobacco and drug use. Limiting alcohol use. Practicing safe sex. Taking low doses of aspirin every day. Taking vitamin and mineral supplements as recommended by your health care provider. What happens during an annual well check? The services and screenings done by your health care provider during  your annual well check will depend on your age, overall health, lifestyle risk factors, and family history of disease. Counseling  Your health care provider may ask you questions about your: Alcohol use. Tobacco use. Drug use. Emotional well-being. Home and relationship well-being. Sexual activity. Eating habits. History of falls. Memory and ability to understand (cognition). Work and work Statistician. Screening  You may have the following tests or measurements: Height, weight, and BMI. Blood pressure. Lipid and cholesterol levels. These may be checked every 5 years, or more frequently if you are over 53 years old. Skin check. Lung cancer screening. You may have this screening every year starting at age 30 if you have a 30-pack-year history of smoking and currently smoke or have quit within the past 15 years. Fecal occult blood test (FOBT) of the stool. You may have this test every year starting at age 37. Flexible sigmoidoscopy or colonoscopy. You may have a sigmoidoscopy every 5 years or a colonoscopy every 10 years starting at age 40. Prostate cancer screening. Recommendations will vary depending on your family history and other risks. Hepatitis C blood test. Hepatitis B blood test. Sexually transmitted disease (STD) testing. Diabetes screening. This is done by checking your blood sugar (glucose) after you have not eaten for a while (fasting). You may have this done every 1-3 years. Abdominal aortic aneurysm (AAA) screening. You may need this if you are a current or former smoker. Osteoporosis. You may be  screened starting at age 47 if you are at high risk. Talk with your health care provider about your test results, treatment options, and if necessary, the need for more tests. Vaccines  Your health care provider may recommend certain vaccines, such as: Influenza vaccine. This is recommended every year. Tetanus, diphtheria, and acellular pertussis (Tdap, Td) vaccine. You may need  a Td booster every 10 years. Zoster vaccine. You may need this after age 48. Pneumococcal 13-valent conjugate (PCV13) vaccine. One dose is recommended after age 64. Pneumococcal polysaccharide (PPSV23) vaccine. One dose is recommended after age 76. Talk to your health care provider about which screenings and vaccines you need and how often you need them. This information is not intended to replace advice given to you by your health care provider. Make sure you discuss any questions you have with your health care provider. Document Released: 04/02/2015 Document Revised: 11/24/2015 Document Reviewed: 01/05/2015 Elsevier Interactive Patient Education  2017 [place] Prevention in the Home Falls can cause injuries. They can happen to people of all ages. There are many things you can do to make your home safe and to help prevent falls. What can I do on the outside of my home? Regularly fix the edges of walkways and driveways and fix any cracks. Remove anything that might make you trip as you walk through a door, such as a raised step or threshold. Trim any bushes or trees on the path to your home. Use bright outdoor lighting. Clear any walking paths of anything that might make someone trip, such as rocks or tools. Regularly check to see if handrails are loose or broken. Make sure that both sides of any steps have handrails. Any raised decks and porches should have guardrails on the edges. Have any leaves, snow, or ice cleared regularly. Use sand or salt on walking paths during winter. Clean up any spills in your garage right away. This includes oil or grease spills. What can I do in the bathroom? Use night lights. Install grab bars by the toilet and in the tub and shower. Do not use towel bars as grab bars. Use non-skid mats or decals in the tub or shower. If you need to sit down in the shower, use a plastic, non-slip stool. Keep the floor dry. Clean up any water that spills on the  floor as soon as it happens. Remove soap buildup in the tub or shower regularly. Attach bath mats securely with double-sided non-slip rug tape. Do not have throw rugs and other things on the floor that can make you trip. What can I do in the bedroom? Use night lights. Make sure that you have a light by your bed that is easy to reach. Do not use any sheets or blankets that are too big for your bed. They should not hang down onto the floor. Have a firm chair that has side arms. You can use this for support while you get dressed. Do not have throw rugs and other things on the floor that can make you trip. What can I do in the kitchen? Clean up any spills right away. Avoid walking on wet floors. Keep items that you use a lot in easy-to-reach places. If you need to reach something above you, use a strong step stool that has a grab bar. Keep electrical cords out of the way. Do not use floor polish or wax that makes floors slippery. If you must use wax, use non-skid floor wax. Do not have  throw rugs and other things on the floor that can make you trip. What can I do with my stairs? Do not leave any items on the stairs. Make sure that there are handrails on both sides of the stairs and use them. Fix handrails that are broken or loose. Make sure that handrails are as long as the stairways. Check any carpeting to make sure that it is firmly attached to the stairs. Fix any carpet that is loose or worn. Avoid having throw rugs at the top or bottom of the stairs. If you do have throw rugs, attach them to the floor with carpet tape. Make sure that you have a light switch at the top of the stairs and the bottom of the stairs. If you do not have them, ask someone to add them for you. What else can I do to help prevent falls? Wear shoes that: Do not have high heels. Have rubber bottoms. Are comfortable and fit you well. Are closed at the toe. Do not wear sandals. If you use a stepladder: Make sure that  it is fully opened. Do not climb a closed stepladder. Make sure that both sides of the stepladder are locked into place. Ask someone to hold it for you, if possible. Clearly mark and make sure that you can see: Any grab bars or handrails. First and last steps. Where the edge of each step is. Use tools that help you move around (mobility aids) if they are needed. These include: Canes. Walkers. Scooters. Crutches. Turn on the lights when you go into a dark area. Replace any light bulbs as soon as they burn out. Set up your furniture so you have a clear path. Avoid moving your furniture around. If any of your floors are uneven, fix them. If there are any pets around you, be aware of where they are. Review your medicines with your doctor. Some medicines can make you feel dizzy. This can increase your chance of falling. Ask your doctor what other things that you can do to help prevent falls. This information is not intended to replace advice given to you by your health care provider. Make sure you discuss any questions you have with your health care provider. Document Released: 12/31/2008 Document Revised: 08/12/2015 Document Reviewed: 04/10/2014 Elsevier Interactive Patient Education  2017 [employer].

## 2022-04-04 NOTE — Progress Notes (Signed)
Subjective:   Joel Bauer is a 89 y.o. male who presents for Medicare Annual/Subsequent preventive examination.  Review of Systems     Cardiac Risk Factors include: advanced age (>78mn, >>35women);dyslipidemia;male gender;hypertension     Objective:    Today's Vitals   04/04/22 1130  BP: 128/72  Weight: 179 lb 9.6 oz (81.5 kg)  Height: '5\' 11"'$  (1.803 m)   Body mass index is 25.05 kg/m.     04/04/2022   12:02 PM 09/02/2020   10:36 AM 05/31/2016    6:46 AM 05/31/2016    2:17 AM 05/29/2016    4:35 PM 04/19/2015    5:00 PM 04/19/2015    9:33 AM  Advanced Directives  Does Patient Have a Medical Advance Directive? Yes Yes Yes Yes No Yes   Type of Advance Directive Living will;Healthcare Power of [name]  [name]   Copy of [place] Chart? No - copy requested  No - copy requested No - copy requested  Yes Yes  Would patient like information on creating a medical advance directive?  No - Patient declined No - Patient declined No - Patient declined       Current Medications (verified) Outpatient Encounter Medications as of 04/04/2022  Medication Sig   amLODipine (NORVASC) 5 MG tablet Take 1 tablet (5 mg total) by mouth daily.   ezetimibe (ZETIA) 10 MG tablet Take 1 tablet (10 mg total) by mouth daily.   fluticasone (FLONASE) 50 MCG/ACT nasal spray Place 1 spray into both nostrils daily as needed for allergies or rhinitis.   hydroxypropyl methylcellulose / hypromellose (ISOPTO TEARS / GONIOVISC) 2.5 % ophthalmic solution Place 1 drop into both eyes 3 (three) times daily as needed for dry eyes.   metoprolol succinate (TOPROL-XL) 25 MG 24 hr tablet TAKE 1 TABLET BY MOUTH EVERY DAY    multivitamin-lutein (OCUVITE-LUTEIN) CAPS capsule Take 1 capsule by mouth daily.   omeprazole (PRILOSEC) 40 MG capsule Take 1 capsule (40 mg total) by mouth daily.   triamterene-hydrochlorothiazide (DYAZIDE) 37.5-25 MG capsule TAKE 1 CAPSULE BY MOUTH EVERY DAY   No facility-administered encounter medications on file as of 04/04/2022.    Allergies (verified) Rosuvastatin   History: Past Medical History:  Diagnosis Date   Adenomatous colon polyp 2001   Anemia    as a child    Aortic regurgitation    a. mild by echo 03/2015   Arthritis    Cancer ([place]    hx of skin cancer    Chest pain 03/31/2015   Diverticulitis large intestine 04/19/2015   Diverticulitis of colon - recurrent/persistent 12/29/2014   Diverticulosis of colon (without mention of hemorrhage) 2005   GERD (gastroesophageal reflux disease)    History of kidney stones    History of skin cancer    Hyperlipidemia    Hypertension    Hypertensive heart disease    LLQ pain 11/26/2014   Macular degeneration    left eye   Orthostasis    Pleurisy    Premature atrial contractions    Prostatitis    PVC's (premature ventricular contractions)    Recurrent colitis due to Clostridium difficile    Retinal hemorrhage    SCC (squamous cell carcinoma)  11/13/2018   RIGHT FOREARM CX3 5FU   Shingles    Small bowel obstruction ([place]) 05/31/2016   Squamous cell carcinoma of skin 07/23/2017   SCC IN SITU LEFT UPPER NOSE BRIDGE CX3 5FU CAUTERY   Past Surgical History:  Procedure Laterality Date   CATARACTS REMOVED     COLONOSCOPY     COLONOSCOPY N/A 06/15/2014   Procedure: COLONOSCOPY;  Surgeon: Gatha Mayer, MD;  Location: [facility];  Service: Endoscopy;  Laterality: N/A;   CYSTOSCOPY WITH RETROGRADE PYELOGRAM, URETEROSCOPY AND STENT PLACEMENT Right 10/18/2013   Procedure: CYSTOSCOPY WITH RIGHT RETROGRADE/RIGHT URETEROSCOPY, STONE BASKETRY,  AND STENT PLACEMENT RIGHT;  Surgeon: Alexis Frock, MD;  Location: [facility];  Service:  Urology;  Laterality: Right;   EYE SURGERY     3 injections to left eye for macular degeneration    FMT  06/2014   LAPAROSCOPIC LOW ANTERIOR RESECTION N/A 04/19/2015   Procedure: LAPAROSCOPIC ASSISTED  LOW ANTERIOR RESECTION, splenic flexure mobilization;  Surgeon: Fanny Skates, MD;  Location: [facility];  Service: General;  Laterality: N/A;   LITHOTRIPSY     TONSILLECTOMY     Family History  Problem Relation Age of Onset   Breast cancer Mother    Heart disease Mother    Colon cancer Father    Colon polyps Father    Kidney disease Sister    Social History   Socioeconomic History   Marital status: Widowed    Spouse name: Not on file   Number of children: 1   Years of education: Not on file   Highest education level: Not on file  Occupational History   Occupation: Retired   Tobacco Use   Smoking status: Never   Smokeless tobacco: Never  Vaping Use   Vaping Use: Never used  Substance and Sexual Activity   Alcohol use: No    Alcohol/week: 0.0 standard drinks of alcohol   Drug use: No   Sexual activity: Not on file  Other Topics Concern   Not on file  Social History Narrative   He is retired, widowed and has 1 child.   He does not use alcohol tobacco or drugs   Social Determinants of Health   Financial Resource Strain: Low Risk  (04/04/2022)   Overall Financial Resource Strain (CARDIA)    Difficulty of Paying Living Expenses: Not hard at all  Food Insecurity: No Food Insecurity (04/04/2022)   Hunger Vital Sign    Worried About Running Out of Food in the Last Year: Never true    Ran Out of Food in the Last Year: Never true  Transportation Needs: No Transportation Needs (04/04/2022)   PRAPARE - Hydrologist (Medical): No    Lack of Transportation (Non-Medical): No  Physical Activity: Insufficiently Active (04/04/2022)   Exercise Vital Sign    Days of Exercise per Week: 4 days    Minutes of Exercise per Session: 30 min  Stress: No Stress Concern  Present (04/04/2022)   [place]    Feeling of Stress : Not at all  Social Connections: Moderately Isolated (04/04/2022)   Social Connection and Isolation Panel [NHANES]    Frequency of Communication with Friends and Family: More than three times a week    Frequency of Social Gatherings with Friends and Family: Twice a week    Attends Religious Services: 1 to 4 times per year    Active Member of Clubs or Organizations: No  Attends Archivist Meetings: Never    Marital Status: Widowed    Tobacco Counseling Counseling given: Not Answered   Clinical Intake:  Pre-visit preparation completed: Yes  Pain : No/denies pain  Diabetes: No  How often do you need to have someone help you when you read instructions, pamphlets, or other written materials from your doctor or pharmacy?: 1 - Never  Diabetic?No   Interpreter Needed?: No  Information entered by :: Denman George LPN   Activities of Daily Living    04/04/2022   12:02 PM  In your present state of health, do you have any difficulty performing the following activities:  Hearing? 0  Vision? 0  Difficulty concentrating or making decisions? 0  Walking or climbing stairs? 0  Dressing or bathing? 0  Doing errands, shopping? 0  Preparing Food and eating ? N  Using the Toilet? N  In the past six months, have you accidently leaked urine? N  Do you have problems with loss of bowel control? N  Managing your Medications? N  Managing your Finances? N  Housekeeping or managing your Housekeeping? N    Patient Care Team: Susy Frizzle, MD as PCP - General (Family Medicine) Dorothy Spark, MD as PCP - Cardiology (Cardiology)  Indicate any recent Medical Services you may have received from other than [facility] providers in the past year (date may be approximate).     Assessment:   This is a routine wellness examination for [place].  Hearing/Vision  screen Hearing Screening - Comments:: Does have hearing difficulty and recently misplaced hearing aids; plans to buy more if he is unable to find them   Vision Screening - Comments:: up to date with routine eye exams with Dr. Iona Hansen    Dietary issues and exercise activities discussed: Current Exercise Habits: Home exercise routine, Type of exercise: walking, Time (Minutes): 30, Frequency (Times/Week): 5, Weekly Exercise (Minutes/Week): 150, Intensity: Mild   Goals Addressed             This Visit's Progress    DIET - INCREASE WATER INTAKE         Depression Screen    04/04/2022   12:01 PM 09/02/2020   10:34 AM 12/26/2016    4:09 PM 05/14/2014   10:44 AM 12/17/2013   11:11 AM  PHQ 2/9 Scores  PHQ - 2 Score 0 0 0 0 0    Fall Risk    04/04/2022   11:55 AM 09/02/2020   10:34 AM 12/26/2016    4:09 PM 05/14/2014   10:44 AM 12/17/2013   11:11 AM  Fall Risk   Falls in the past year? 0 0 No No No  Number falls in past yr: 0 0     Injury with Fall? 0 0     Risk for fall due to : No Fall Risks No Fall Risks     Follow up Falls evaluation completed;Education provided;Falls prevention discussed Falls evaluation completed       FALL RISK PREVENTION PERTAINING TO THE HOME:  Any stairs in or around the home? Yes  If so, are there any without handrails? No  Home free of loose throw rugs in walkways, pet beds, electrical cords, etc? Yes  Adequate lighting in your home to reduce risk of falls? Yes   ASSISTIVE DEVICES UTILIZED TO PREVENT FALLS:  Life alert? No  Use of a cane, walker or w/c? No  Grab bars in the bathroom? Yes  Shower chair or bench in shower?  No  Elevated toilet seat or a handicapped toilet? Yes   TIMED UP AND GO:  Was the test performed? Yes .  Length of time to ambulate 10 feet: 6 sec.   Gait steady and fast without use of assistive device  Cognitive Function:        04/04/2022   12:02 PM  6CIT Screen  What Year? 0 points  What month? 0 points  What  time? 0 points  Count back from 20 0 points  Months in reverse 0 points  Repeat phrase 0 points  Total Score 0 points    Immunizations Immunization History  Administered Date(s) Administered   Influenza, High Dose Seasonal PF 12/11/2018, 12/11/2018   Influenza,inj,Quad PF,6+ Mos 12/26/2016, 12/31/2017   Influenza-Unspecified 03/06/2007, 12/27/2007, 11/18/2012, 04/03/2016   PFIZER(Purple Top)SARS-COV-2 Vaccination 04/10/2019, 05/01/2019, 01/27/2020   Pneumococcal Conjugate-13 03/27/2019   Pneumococcal Polysaccharide-23 07/28/2010   Td 11/10/2003    TDAP status: Due, Education has been provided regarding the importance of this vaccine. Advised may receive this vaccine at local pharmacy or Health Dept. Aware to provide a copy of the vaccination record if obtained from local pharmacy or Health Dept. Verbalized acceptance and understanding.  Flu Vaccine status: Due, Education has been provided regarding the importance of this vaccine. Advised may receive this vaccine at local pharmacy or Health Dept. Aware to provide a copy of the vaccination record if obtained from local pharmacy or Health Dept. Verbalized acceptance and understanding.  Pneumococcal vaccine status: Up to date  Covid-19 vaccine status: Information provided on how to obtain vaccines.   Qualifies for Shingles Vaccine? Yes   Zostavax completed No   Shingrix Completed?: No.    Education has been provided regarding the importance of this vaccine. Patient has been advised to call insurance company to determine out of pocket expense if they have not yet received this vaccine. Advised may also receive vaccine at local pharmacy or Health Dept. Verbalized acceptance and understanding.  Screening Tests Health Maintenance  Topic Date Due   DTaP/Tdap/Td (2 - Tdap) 11/09/2013   INFLUENZA VACCINE  06/18/2022 (Originally 10/18/2021)   COVID-19 Vaccine (4 - 2023-24 season) 07/19/2022 (Originally 11/18/2021)   Zoster Vaccines- Shingrix (1  of 2) 07/19/2022 (Originally 03/04/1953)   Medicare Annual Wellness (AWV)  04/05/2023   Pneumonia Vaccine 76+ Years old  Completed   HPV VACCINES  Aged Out    Health Maintenance  Health Maintenance Due  Topic Date Due   DTaP/Tdap/Td (2 - Tdap) 11/09/2013    Colorectal cancer screening: No longer required.   Lung Cancer Screening: (Low Dose CT Chest recommended if Age 31-80 years, 30 pack-year currently smoking OR have quit w/in 15years.) does not qualify.   Lung Cancer Screening Referral: n/a   Additional Screening:  Hepatitis C Screening: does not qualify  Vision Screening: Recommended annual ophthalmology exams for early detection of glaucoma and other disorders of the eye. Is the patient up to date with their annual eye exam?  Yes  Who is the provider or what is the name of the office in which the patient attends annual eye exams? Dr. Iona Hansen  If pt is not established with a provider, would they like to be referred to a provider to establish care? No .   Dental Screening: Recommended annual dental exams for proper oral hygiene  Community Resource Referral / Chronic Care Management: CRR required this visit?  No   CCM required this visit?  No      Plan:  I have personally reviewed and noted the following in the patient's chart:   Medical and social history Use of alcohol, tobacco or illicit drugs  Current medications and supplements including opioid prescriptions. Patient is not currently taking opioid prescriptions. Functional ability and status Nutritional status Physical activity Advanced directives List of other physicians Hospitalizations, surgeries, and ER visits in previous 12 months Vitals Screenings to include cognitive, depression, and falls Referrals and appointments  In addition, I have reviewed and discussed with patient certain preventive protocols, quality metrics, and best practice recommendations. A written personalized care plan for  preventive services as well as general preventive health recommendations were provided to patient.     Denman George [place], [place]   1/94/7125   Nurse Notes: No concerns

## 2022-04-22 ENCOUNTER — Other Ambulatory Visit: Payer: Self-pay | Admitting: Family Medicine

## 2022-04-22 DIAGNOSIS — K219 Gastro-esophageal reflux disease without esophagitis: Secondary | ICD-10-CM

## 2022-04-24 NOTE — Telephone Encounter (Signed)
Requested Prescriptions  Pending Prescriptions Disp Refills   omeprazole (PRILOSEC) 40 MG capsule [Pharmacy Med Name: OMEPRAZOLE DR 40 MG CAPSULE] 90 capsule 0    Sig: TAKE 1 CAPSULE (40 MG TOTAL) BY MOUTH DAILY.     Gastroenterology: Proton Pump Inhibitors Passed - 04/22/2022  1:00 AM      Passed - Valid encounter within last 12 months    Recent Outpatient Visits           10 months ago Benign essential HTN   [place] Dennard Schaumann, Cammie Mcgee, MD   1 year ago Hypertensive heart disease without heart failure   [place] Dennard Schaumann Cammie Mcgee, MD   3 years ago Benign essential HTN   [place] Dennard Schaumann Cammie Mcgee, MD   4 years ago Bacterial respiratory infection   [place], [place], PA-C   5 years ago Chronic fatigue   [place], Cammie Mcgee, MD       Future Appointments             In 1 month [name], Cammie Mcgee, MD [place], [facility]   In 4 months Johney Frame, Greer Ee, MD [place] [facility] [place]

## 2022-05-02 ENCOUNTER — Other Ambulatory Visit: Payer: Self-pay

## 2022-05-02 DIAGNOSIS — K219 Gastro-esophageal reflux disease without esophagitis: Secondary | ICD-10-CM

## 2022-05-02 MED ORDER — OMEPRAZOLE 40 MG PO CPDR: 40.0000 mg | DELAYED_RELEASE_CAPSULE | Freq: Every day | ORAL | 1 refills | Status: DC

## 2022-05-18 DIAGNOSIS — H353211 Exudative age-related macular degeneration, right eye, with active choroidal neovascularization: Secondary | ICD-10-CM | POA: Diagnosis not present

## 2022-05-18 DIAGNOSIS — H35033 Hypertensive retinopathy, bilateral: Secondary | ICD-10-CM | POA: Diagnosis not present

## 2022-05-18 DIAGNOSIS — H35433 Paving stone degeneration of retina, bilateral: Secondary | ICD-10-CM | POA: Diagnosis not present

## 2022-05-18 DIAGNOSIS — H43811 Vitreous degeneration, right eye: Secondary | ICD-10-CM | POA: Diagnosis not present

## 2022-05-18 DIAGNOSIS — H353222 Exudative age-related macular degeneration, left eye, with inactive choroidal neovascularization: Secondary | ICD-10-CM | POA: Diagnosis not present

## 2022-05-18 DIAGNOSIS — H43391 Other vitreous opacities, right eye: Secondary | ICD-10-CM | POA: Diagnosis not present

## 2022-05-25 ENCOUNTER — Ambulatory Visit (HOSPITAL_COMMUNITY): Payer: Medicare Other | Attending: Cardiology

## 2022-05-25 DIAGNOSIS — I351 Nonrheumatic aortic (valve) insufficiency: Secondary | ICD-10-CM | POA: Insufficient documentation

## 2022-05-25 LAB — ECHOCARDIOGRAM COMPLETE
Area-P 1/2: 2.99 cm2
P 1/2 time: 535 msec
S' Lateral: 3.1 cm

## 2022-05-26 ENCOUNTER — Telehealth: Payer: Self-pay | Admitting: Cardiology

## 2022-05-26 NOTE — Telephone Encounter (Signed)
Please return call to his mobile at [phone].

## 2022-05-26 NOTE — Telephone Encounter (Signed)
The patient has been notified of the result and verbalized understanding.  All questions (if any) were answered. Nuala Alpha, LPN [id number] AM

## 2022-05-26 NOTE — Telephone Encounter (Signed)
-----   Message from Freada Bergeron, MD sent at 05/25/2022  7:42 PM EST ----- His echo shows normal pumping function. There is mild leakiness of his aortic valve which is stable. Overall, no significant change from prior study.

## 2022-05-26 NOTE — Telephone Encounter (Signed)
Patient is returning call.  °

## 2022-05-26 NOTE — Telephone Encounter (Signed)
Patient is returning RN's call for his echo results. Please advise.  

## 2022-05-26 NOTE — Telephone Encounter (Signed)
Left message for the pt to call back for results.

## 2022-06-16 DIAGNOSIS — R042 Hemoptysis: Secondary | ICD-10-CM | POA: Diagnosis not present

## 2022-06-16 DIAGNOSIS — J069 Acute upper respiratory infection, unspecified: Secondary | ICD-10-CM | POA: Diagnosis not present

## 2022-06-22 ENCOUNTER — Encounter: Payer: Medicare Other | Admitting: Family Medicine

## 2022-06-27 ENCOUNTER — Other Ambulatory Visit: Payer: Self-pay | Admitting: Family Medicine

## 2022-06-27 DIAGNOSIS — K219 Gastro-esophageal reflux disease without esophagitis: Secondary | ICD-10-CM

## 2022-06-30 DIAGNOSIS — R051 Acute cough: Secondary | ICD-10-CM | POA: Diagnosis not present

## 2022-06-30 DIAGNOSIS — H66002 Acute suppurative otitis media without spontaneous rupture of ear drum, left ear: Secondary | ICD-10-CM | POA: Diagnosis not present

## 2022-06-30 DIAGNOSIS — I10 Essential (primary) hypertension: Secondary | ICD-10-CM | POA: Diagnosis not present

## 2022-07-21 ENCOUNTER — Other Ambulatory Visit: Payer: Medicare Other

## 2022-07-21 ENCOUNTER — Encounter: Payer: Self-pay | Admitting: Family Medicine

## 2022-07-21 ENCOUNTER — Ambulatory Visit (INDEPENDENT_AMBULATORY_CARE_PROVIDER_SITE_OTHER): Payer: Medicare Other | Admitting: Family Medicine

## 2022-07-21 VITALS — BP 118/70 | HR 69 | Temp 97.6°F | Ht 71.0 in | Wt 177.4 lb

## 2022-07-21 DIAGNOSIS — I10 Essential (primary) hypertension: Secondary | ICD-10-CM

## 2022-07-21 DIAGNOSIS — E782 Mixed hyperlipidemia: Secondary | ICD-10-CM

## 2022-07-21 DIAGNOSIS — I351 Nonrheumatic aortic (valve) insufficiency: Secondary | ICD-10-CM

## 2022-07-21 DIAGNOSIS — Z0001 Encounter for general adult medical examination with abnormal findings: Secondary | ICD-10-CM | POA: Diagnosis not present

## 2022-07-21 DIAGNOSIS — Z0000 Encounter for general adult medical examination without abnormal findings: Secondary | ICD-10-CM

## 2022-07-21 DIAGNOSIS — Z23 Encounter for immunization: Secondary | ICD-10-CM

## 2022-07-21 MED ORDER — PRIMIDONE 50 MG PO TABS: 50.0000 mg | ORAL_TABLET | Freq: Every day | ORAL | 5 refills | Status: DC

## 2022-07-21 NOTE — Progress Notes (Signed)
Subjective:    Patient ID: Joel Bauer, male    DOB: [birth year], 89 y.o.   MRN: [id number] Patient is an 89 year old gentleman who presents today for complete physical exam.  Trouble with an essential tremor.  Has had this for years.  We discussed treatment options and he is now ready to try primidone.  He is due for the shingles vaccine.  He is also due for a tetanus shot.  He would like to get the shingles vaccine today but he defers the tetanus shot.  He denies any falls, memory loss, or depression.  Due to his age she does not require prostate cancer screening or colon cancer screening.  Past medical history is significant for partial colectomy secondary to recurrent diverticulitis, mild aortic regurgitation, and hypertension.  He denies any chest pain shortness of breath or dyspnea on exertion  Past Medical History:  Diagnosis Date   Adenomatous colon polyp 2001   Anemia    as a child    Aortic regurgitation    a. mild by echo 03/2015   Arthritis    Cancer (HCC)    hx of skin cancer    Chest pain 03/31/2015   Diverticulitis large intestine 04/19/2015   Diverticulitis of colon - recurrent/persistent 12/29/2014   Diverticulosis of colon (without mention of hemorrhage) 2005   GERD (gastroesophageal reflux disease)    History of kidney stones    History of skin cancer    Hyperlipidemia    Hypertension    Hypertensive heart disease    LLQ pain 11/26/2014   Macular degeneration    left eye   Orthostasis    Pleurisy    Premature atrial contractions    Prostatitis    PVC's (premature ventricular contractions)    Recurrent colitis due to Clostridium difficile    Retinal hemorrhage    SCC (squamous cell carcinoma) 11/13/2018   RIGHT FOREARM CX3 5FU   Shingles    Small bowel obstruction (HCC) 05/31/2016   Squamous cell carcinoma of skin 07/23/2017   SCC IN SITU LEFT UPPER NOSE BRIDGE CX3 5FU CAUTERY   Past Surgical History:  Procedure Laterality Date   CATARACTS  REMOVED     COLONOSCOPY     COLONOSCOPY N/A 06/15/2014   Procedure: COLONOSCOPY;  Surgeon: Iva Boop, MD;  Location: [facility] ENDOSCOPY;  Service: Endoscopy;  Laterality: N/A;   CYSTOSCOPY WITH RETROGRADE PYELOGRAM, URETEROSCOPY AND STENT PLACEMENT Right 10/18/2013   Procedure: CYSTOSCOPY WITH RIGHT RETROGRADE/RIGHT URETEROSCOPY, STONE BASKETRY,  AND STENT PLACEMENT RIGHT;  Surgeon: Sebastian Ache, MD;  Location: [facility] ORS;  Service: Urology;  Laterality: Right;   EYE SURGERY     3 injections to left eye for macular degeneration    FMT  06/2014   LAPAROSCOPIC LOW ANTERIOR RESECTION N/A 04/19/2015   Procedure: LAPAROSCOPIC ASSISTED  LOW ANTERIOR RESECTION, splenic flexure mobilization;  Surgeon: Claud Kelp, MD;  Location: [facility] ORS;  Service: General;  Laterality: N/A;   LITHOTRIPSY     TONSILLECTOMY     Current Outpatient Medications on File Prior to Visit  Medication Sig Dispense Refill   amLODipine (NORVASC) 5 MG tablet Take 1 tablet (5 mg total) by mouth daily. 90 tablet 2   fluticasone (FLONASE) 50 MCG/ACT nasal spray Place 1 spray into both nostrils daily as needed for allergies or rhinitis.     hydroxypropyl methylcellulose / hypromellose (ISOPTO TEARS / GONIOVISC) 2.5 % ophthalmic solution Place 1 drop into both eyes 3 (three) times  daily as needed for dry eyes.     metoprolol succinate (TOPROL-XL) 25 MG 24 hr tablet TAKE 1 TABLET BY MOUTH EVERY DAY 90 tablet 3   multivitamin-lutein (OCUVITE-LUTEIN) CAPS capsule Take 1 capsule by mouth daily.     omeprazole (PRILOSEC) 40 MG capsule TAKE 1 CAPSULE(40 MG) BY MOUTH DAILY 30 capsule 1   triamterene-hydrochlorothiazide (DYAZIDE) 37.5-25 MG capsule TAKE 1 CAPSULE BY MOUTH EVERY DAY 90 capsule 2   ezetimibe (ZETIA) 10 MG tablet Take 1 tablet (10 mg total) by mouth daily. 30 tablet 11   No current facility-administered medications on file prior to visit.   Allergies  Allergen Reactions   Rosuvastatin Other (See Comments)    Pt reports causes  muscle cramps in legs   Social History   Socioeconomic History   Marital status: Widowed    Spouse name: Not on file   Number of children: 1   Years of education: Not on file   Highest education level: Not on file  Occupational History   Occupation: Retired   Tobacco Use   Smoking status: Never   Smokeless tobacco: Never  Vaping Use   Vaping Use: Never used  Substance and Sexual Activity   Alcohol use: No    Alcohol/week: 0.0 standard drinks of alcohol   Drug use: No   Sexual activity: Not on file  Other Topics Concern   Not on file  Social History Narrative   He is retired, widowed and has 1 child.   He does not use alcohol tobacco or drugs   Social Determinants of Health   Financial Resource Strain: Low Risk  (04/04/2022)   Overall Financial Resource Strain (CARDIA)    Difficulty of Paying Living Expenses: Not hard at all  Food Insecurity: No Food Insecurity (04/04/2022)   Hunger Vital Sign    Worried About Running Out of Food in the Last Year: Never true    Ran Out of Food in the Last Year: Never true  Transportation Needs: No Transportation Needs (04/04/2022)   PRAPARE - Administrator, Civil Service (Medical): No    Lack of Transportation (Non-Medical): No  Physical Activity: Insufficiently Active (04/04/2022)   Exercise Vital Sign    Days of Exercise per Week: 4 days    Minutes of Exercise per Session: 30 min  Stress: No Stress Concern Present (04/04/2022)   [employer] of Occupational Health - Occupational Stress Questionnaire    Feeling of Stress : Not at all  Social Connections: Moderately Isolated (04/04/2022)   Social Connection and Isolation Panel [NHANES]    Frequency of Communication with Friends and Family: More than three times a week    Frequency of Social Gatherings with Friends and Family: Twice a week    Attends Religious Services: 1 to 4 times per year    Active Member of [employer] or Organizations: No    Attends Banker  Meetings: Never    Marital Status: Widowed  Intimate Partner Violence: Not At Risk (04/04/2022)   Humiliation, Afraid, Rape, and Kick questionnaire    Fear of Current or Ex-Partner: No    Emotionally Abused: No    Physically Abused: No    Sexually Abused: No      Review of Systems  All other systems reviewed and are negative.      Objective:   Physical Exam Vitals reviewed.  Constitutional:      General: He is not in acute distress.    Appearance: Normal appearance.  He is well-developed and normal weight. He is not ill-appearing, toxic-appearing or diaphoretic.  HENT:     Right Ear: Tympanic membrane and ear canal normal.     Left Ear: Tympanic membrane and ear canal normal.     Nose: Nose normal. No congestion or rhinorrhea.     Mouth/Throat:     Pharynx: No oropharyngeal exudate or posterior oropharyngeal erythema.  Eyes:     Extraocular Movements: Extraocular movements intact.     Conjunctiva/sclera: Conjunctivae normal.     Pupils: Pupils are equal, round, and reactive to light.  Neck:     Vascular: No carotid bruit or JVD.  Cardiovascular:     Rate and Rhythm: Normal rate and regular rhythm.     Heart sounds: Murmur heard.     No friction rub. No gallop.  Pulmonary:     Effort: Pulmonary effort is normal. No respiratory distress.     Breath sounds: Normal breath sounds. No stridor. No wheezing, rhonchi or rales.  Chest:     Chest wall: No tenderness.  Abdominal:     General: Bowel sounds are normal. There is no distension.     Palpations: Abdomen is soft. There is no mass.     Tenderness: There is no abdominal tenderness. There is no guarding or rebound.  Musculoskeletal:        General: No swelling, tenderness, deformity or signs of injury.     Cervical back: Neck supple. No rigidity or tenderness.     Right lower leg: No edema.     Left lower leg: No edema.  Lymphadenopathy:     Cervical: No cervical adenopathy.  Skin:    Coloration: Skin is not jaundiced  or pale.     Findings: No bruising, erythema, lesion or rash.  Neurological:     General: No focal deficit present.     Mental Status: He is alert and oriented to person, place, and time. Mental status is at baseline.     Cranial Nerves: No cranial nerve deficit.     Sensory: No sensory deficit.     Motor: No weakness.     Coordination: Coordination normal.     Gait: Gait normal.     Deep Tendon Reflexes: Reflexes normal.  Psychiatric:        Mood and Affect: Mood normal.        Behavior: Behavior normal.        Thought Content: Thought content normal.           Assessment & Plan:  Need for zoster vaccination - Plan: Varicella-zoster vaccine IM  Encounter for Medicare annual wellness exam  Benign essential HTN  Mixed hyperlipidemia  Aortic valve insufficiency, etiology of cardiac valve disease unspecified Patient's physical exam today is normal aside for murmur which is consistent with his aortic regurgitation.  However the murmur is mild he is asymptomatic I see no clear tachycardia,.  If he develops symptoms such as shortness of breath dyspnea on exertion lightheadedness, we could repeat the echo at that time.  I do not recommend a colonoscopy or PSA.  The patient received his shingles vaccine.  He declines a tetanus.  His blood pressure is excellent.  I will check a CBC a CMP and a lipid panel.  Start the patient on primidone 50 mg p.o. nightly and uptitrate to 200 mg if necessary to help manage the tremor

## 2022-07-22 LAB — LIPID PANEL
Cholesterol: 160 mg/dL (ref <200)
HDL: 46 mg/dL (ref >=40)
LDL Cholesterol (Calc): 86 mg/dL (calc)
Non-HDL Cholesterol (Calc): 114 mg/dL (calc) (ref <130)
Total CHOL/HDL Ratio: 3.5 (calc) (ref <5.0)
Triglycerides: 180 mg/dL — ABNORMAL HIGH (ref <150)

## 2022-07-22 LAB — CBC WITH DIFFERENTIAL/PLATELET
Absolute Monocytes: 418 cells/uL (ref 200–950)
Basophils Absolute: 67 cells/uL (ref 0–200)
Basophils Relative: 1.4 %
Eosinophils Absolute: 206 cells/uL (ref 15–500)
Eosinophils Relative: 4.3 %
HCT: 43.8 % (ref 38.5–50.0)
Hemoglobin: 14.3 g/dL (ref 13.2–17.1)
Lymphs Abs: 1565 cells/uL (ref 850–3900)
MCH: 29.5 pg (ref 27.0–33.0)
MCHC: 32.6 g/dL (ref 32.0–36.0)
MCV: 90.3 fL (ref 80.0–100.0)
MPV: 10 fL (ref 7.5–12.5)
Monocytes Relative: 8.7 %
Neutro Abs: 2544 cells/uL (ref 1500–7800)
Neutrophils Relative %: 53 %
Platelets: 168 10*3/uL (ref 140–400)
RBC: 4.85 10*6/uL (ref 4.20–5.80)
RDW: 14.2 % (ref 11.0–15.0)
Total Lymphocyte: 32.6 %
WBC: 4.8 10*3/uL (ref 3.8–10.8)

## 2022-07-22 LAB — COMPLETE METABOLIC PANEL WITH GFR
AG Ratio: 2.1 (calc) (ref 1.0–2.5)
ALT: 41 U/L (ref 9–46)
AST: 28 U/L (ref 10–35)
Albumin: 4.1 g/dL (ref 3.6–5.1)
Alkaline phosphatase (APISO): 59 U/L (ref 35–144)
BUN: 19 mg/dL (ref 7–25)
CO2: 30 mmol/L (ref 20–32)
Calcium: 8.9 mg/dL (ref 8.6–10.3)
Chloride: 101 mmol/L (ref 98–110)
Creat: 1.07 mg/dL (ref 0.70–1.22)
Globulin: 2 g/dL (calc) (ref 1.9–3.7)
Glucose, Bld: 104 mg/dL — ABNORMAL HIGH (ref 65–99)
Potassium: 3.3 mmol/L — ABNORMAL LOW (ref 3.5–5.3)
Sodium: 141 mmol/L (ref 135–146)
Total Bilirubin: 0.7 mg/dL (ref 0.2–1.2)
Total Protein: 6.1 g/dL (ref 6.1–8.1)
eGFR: 68 mL/min/{1.73_m2} (ref >=60)

## 2022-07-27 ENCOUNTER — Telehealth: Payer: Self-pay

## 2022-07-27 NOTE — Telephone Encounter (Signed)
Pt was returning call from office to discuss his lab results. Pt would like to speak with nurse about these results please.   Cb#: [phone]

## 2022-07-28 ENCOUNTER — Other Ambulatory Visit: Payer: Self-pay

## 2022-07-28 MED ORDER — POTASSIUM CHLORIDE CRYS ER 10 MEQ PO TBCR: 10.0000 meq | EXTENDED_RELEASE_TABLET | Freq: Two times a day (BID) | ORAL | 1 refills | Status: DC

## 2022-07-28 NOTE — Telephone Encounter (Signed)
Return call to pt yesterday evening, LVM for pt

## 2022-07-31 ENCOUNTER — Other Ambulatory Visit: Payer: Self-pay

## 2022-07-31 MED ORDER — POTASSIUM CHLORIDE CRYS ER 10 MEQ PO TBCR: 10.0000 meq | EXTENDED_RELEASE_TABLET | Freq: Two times a day (BID) | ORAL | 1 refills | Status: DC

## 2022-08-07 ENCOUNTER — Telehealth: Payer: Self-pay

## 2022-08-07 NOTE — Telephone Encounter (Signed)
Pt called in wanting to speak with nurse about taking this med primidone (MYSOLINE) 50 MG tablet. Pt states that he is up to 350 mg q d and has tolerated well so far. Pt would like to know if pcp would like for him to continue to take this med at this dosage amount? Please advise.  Cb#: [phone]

## 2022-08-10 DIAGNOSIS — H35433 Paving stone degeneration of retina, bilateral: Secondary | ICD-10-CM | POA: Diagnosis not present

## 2022-08-10 DIAGNOSIS — H35033 Hypertensive retinopathy, bilateral: Secondary | ICD-10-CM | POA: Diagnosis not present

## 2022-08-10 DIAGNOSIS — H43811 Vitreous degeneration, right eye: Secondary | ICD-10-CM | POA: Diagnosis not present

## 2022-08-10 DIAGNOSIS — H43391 Other vitreous opacities, right eye: Secondary | ICD-10-CM | POA: Diagnosis not present

## 2022-08-10 DIAGNOSIS — H353211 Exudative age-related macular degeneration, right eye, with active choroidal neovascularization: Secondary | ICD-10-CM | POA: Diagnosis not present

## 2022-08-15 ENCOUNTER — Other Ambulatory Visit: Payer: Self-pay | Admitting: *Deleted

## 2022-08-15 MED ORDER — TRIAMTERENE-HCTZ 37.5-25 MG PO CAPS: ORAL_CAPSULE | ORAL | 2 refills | Status: DC

## 2022-08-24 ENCOUNTER — Other Ambulatory Visit: Payer: Self-pay | Admitting: Family Medicine

## 2022-08-24 DIAGNOSIS — K219 Gastro-esophageal reflux disease without esophagitis: Secondary | ICD-10-CM

## 2022-08-24 NOTE — Telephone Encounter (Signed)
Requested Prescriptions  Pending Prescriptions Disp Refills   omeprazole (PRILOSEC) 40 MG capsule [Pharmacy Med Name: OMEPRAZOLE 40MG  CAPSULES] 90 capsule 0    Sig: TAKE 1 CAPSULE(40 MG) BY MOUTH DAILY     Gastroenterology: Proton Pump Inhibitors Failed - 08/24/2022  3:35 AM      Failed - Valid encounter within last 12 months    Recent Outpatient Visits           1 year ago Benign essential HTN   [facility] Donita Brooks, MD   1 year ago Hypertensive heart disease without heart failure   [facility] Donita Brooks, MD   3 years ago Benign essential HTN   [facility] Donita Brooks, MD   5 years ago Bacterial respiratory infection   [facility] Dorena Bodo, PA-C   5 years ago Chronic fatigue   [facility] [name], Priscille Heidelberg, MD       Future Appointments             In 3 weeks Shari Prows, Kathlynn Grate, MD [facility]

## 2022-08-29 ENCOUNTER — Other Ambulatory Visit: Payer: Self-pay | Admitting: Family Medicine

## 2022-08-29 DIAGNOSIS — K219 Gastro-esophageal reflux disease without esophagitis: Secondary | ICD-10-CM

## 2022-08-29 MED ORDER — PRIMIDONE 50 MG PO TABS: 50.0000 mg | ORAL_TABLET | Freq: Three times a day (TID) | ORAL | 3 refills | Status: DC

## 2022-08-30 NOTE — Telephone Encounter (Signed)
Unable to refill per protocol, Rx request is too soon. Last refill 08/24/22 for 90 days.  Requested Prescriptions  Pending Prescriptions Disp Refills   omeprazole (PRILOSEC) 40 MG capsule [Pharmacy Med Name: OMEPRAZOLE 40MG  CAPSULES] 90 capsule 0    Sig: TAKE 1 CAPSULE(40 MG) BY MOUTH DAILY     Gastroenterology: Proton Pump Inhibitors Failed - 08/29/2022 10:28 AM      Failed - Valid encounter within last 12 months    Recent Outpatient Visits           1 year ago Benign essential HTN   [facility] Donita Brooks, MD   1 year ago Hypertensive heart disease without heart failure   [facility] Donita Brooks, MD   3 years ago Benign essential HTN   [facility] Donita Brooks, MD   5 years ago Bacterial respiratory infection   [facility] Dorena Bodo, PA-C   5 years ago Chronic fatigue   [facility] [name], Priscille Heidelberg, MD       Future Appointments             In 2 weeks Shari Prows, Kathlynn Grate, MD [facility] at [facility]

## 2022-09-12 NOTE — Progress Notes (Unsigned)
Cardiology Office Note:    Date:  09/14/2022   ID:  BUREN HAVEY, DOB 18-Jun-1933, MRN [id number]  PCP:  Donita Brooks, MD   [facility] Providers Cardiologist:  Tobias Alexander, MD {   Referring MD: Donita Brooks, MD    History of Present Illness:    Joel Bauer is a 89 y.o. male with a hx of  HTN, HLD intolerant of statins, atypical chest pain with normal nuclear stress test in 2017, mild aortic regurgitation, palpitations, and PVCs controlled on metop who was previously followed by Dr. Delton See who now returns to clinic for annual cardiac follow-up.  Patient was last seen by Dr. Delton See on 01/08/20 where he was doing well and remained active without chest pain or SOB. Holter 01/2017 with 800 PACs and 1200 PVCs with palpitations currently controlled with metoprolol. TTE 06/04/19 with LVEF 60-65% with mild AI unchanged from prior TTE. Nuc 2017 with no evidence of ischemia or infarction.  Was last seen in clinic on 02/2022 where he was doing well from a CV standpoint.   Today, the patient overall feels well. No chest pain, SOB, LE edema, orthopnea, or PND. Remains active and plays golf and walks 1-27miles/day without issue. Tolerating medications as prescribed. Blood pressure running 120s on average at home.    Past Medical History:  Diagnosis Date   Adenomatous colon polyp 2001   Anemia    as a child    Aortic regurgitation    a. mild by echo 03/2015   Arthritis    Cancer (HCC)    hx of skin cancer    Chest pain 03/31/2015   Diverticulitis large intestine 04/19/2015   Diverticulitis of colon - recurrent/persistent 12/29/2014   Diverticulosis of colon (without mention of hemorrhage) 2005   GERD (gastroesophageal reflux disease)    History of kidney stones    History of skin cancer    Hyperlipidemia    Hypertension    Hypertensive heart disease    LLQ pain 11/26/2014   Macular degeneration    left eye   Orthostasis    Pleurisy    Premature atrial contractions     Prostatitis    PVC's (premature ventricular contractions)    Recurrent colitis due to Clostridium difficile    Retinal hemorrhage    SCC (squamous cell carcinoma) 11/13/2018   RIGHT FOREARM CX3 5FU   Shingles    Small bowel obstruction (HCC) 05/31/2016   Squamous cell carcinoma of skin 07/23/2017   SCC IN SITU LEFT UPPER NOSE BRIDGE CX3 5FU CAUTERY    Past Surgical History:  Procedure Laterality Date   CATARACTS REMOVED     COLONOSCOPY     COLONOSCOPY N/A 06/15/2014   Procedure: COLONOSCOPY;  Surgeon: Iva Boop, MD;  Location: [facility]COPY;  Service: Endoscopy;  Laterality: N/A;   CYSTOSCOPY WITH RETROGRADE PYELOGRAM, URETEROSCOPY AND STENT PLACEMENT Right 10/18/2013   Procedure: CYSTOSCOPY WITH RIGHT RETROGRADE/RIGHT URETEROSCOPY, STONE BASKETRY,  AND STENT PLACEMENT RIGHT;  Surgeon: Sebastian Ache, MD;  Location: [facility] ORS;  Service: Urology;  Laterality: Right;   EYE SURGERY     3 injections to left eye for macular degeneration    FMT  06/2014   LAPAROSCOPIC LOW ANTERIOR RESECTION N/A 04/19/2015   Procedure: LAPAROSCOPIC ASSISTED  LOW ANTERIOR RESECTION, splenic flexure mobilization;  Surgeon: Claud Kelp, MD;  Location: [facility] ORS;  Service: General;  Laterality: N/A;   LITHOTRIPSY     TONSILLECTOMY      Current Medications: Current Meds  Medication Sig   amLODipine (NORVASC) 5 MG tablet Take 1 tablet (5 mg total) by mouth daily.   ezetimibe (ZETIA) 10 MG tablet Take 1 tablet (10 mg total) by mouth daily.   fluticasone (FLONASE) 50 MCG/ACT nasal spray Place 1 spray into both nostrils daily as needed for allergies or rhinitis.   hydroxypropyl methylcellulose / hypromellose (ISOPTO TEARS / GONIOVISC) 2.5 % ophthalmic solution Place 1 drop into both eyes 3 (three) times daily as needed for dry eyes.   metoprolol succinate (TOPROL-XL) 25 MG 24 hr tablet TAKE 1 TABLET BY MOUTH EVERY DAY   multivitamin-lutein (OCUVITE-LUTEIN) CAPS capsule Take 1 capsule by mouth daily.   omeprazole  (PRILOSEC) 40 MG capsule TAKE 1 CAPSULE(40 MG) BY MOUTH DAILY   potassium chloride (KLOR-CON M) 10 MEQ tablet Take 1 tablet (10 mEq total) by mouth 2 (two) times daily.   primidone (MYSOLINE) 50 MG tablet Take 1 tablet (50 mg total) by mouth 3 (three) times daily.   triamterene-hydrochlorothiazide (DYAZIDE) 37.5-25 MG capsule TAKE 1 CAPSULE BY MOUTH EVERY DAY     Allergies:   Rosuvastatin   Social History   Socioeconomic History   Marital status: Widowed    Spouse name: Not on file   Number of children: 1   Years of education: Not on file   Highest education level: Not on file  Occupational History   Occupation: Retired   Tobacco Use   Smoking status: Never   Smokeless tobacco: Never  Vaping Use   Vaping Use: Never used  Substance and Sexual Activity   Alcohol use: No    Alcohol/week: 0.0 standard drinks of alcohol   Drug use: No   Sexual activity: Not on file  Other Topics Concern   Not on file  Social History Narrative   He is retired, widowed and has 1 child.   He does not use alcohol tobacco or drugs   Social Determinants of Health   Financial Resource Strain: Low Risk  (04/04/2022)   Overall Financial Resource Strain (CARDIA)    Difficulty of Paying Living Expenses: Not hard at all  Food Insecurity: No Food Insecurity (04/04/2022)   Hunger Vital Sign    Worried About Running Out of Food in the Last Year: Never true    Ran Out of Food in the Last Year: Never true  Transportation Needs: No Transportation Needs (04/04/2022)   PRAPARE - Administrator, Civil Service (Medical): No    Lack of Transportation (Non-Medical): No  Physical Activity: Insufficiently Active (04/04/2022)   Exercise Vital Sign    Days of Exercise per Week: 4 days    Minutes of Exercise per Session: 30 min  Stress: No Stress Concern Present (04/04/2022)   [employer] of Occupational Health - Occupational Stress Questionnaire    Feeling of Stress : Not at all  Social Connections:  Moderately Isolated (04/04/2022)   Social Connection and Isolation Panel [NHANES]    Frequency of Communication with Friends and Family: More than three times a week    Frequency of Social Gatherings with Friends and Family: Twice a week    Attends Religious Services: 1 to 4 times per year    Active Member of [employer] or Organizations: No    Attends Banker Meetings: Never    Marital Status: Widowed     Family History: The patient's family history includes Breast cancer in his mother; Colon cancer in his father; Colon polyps in his father; Heart disease in his  mother; Kidney disease in his sister.  ROS:   Please see the history of present illness.    Review of Systems  Constitutional:  Negative for chills and fever.  HENT:  Negative for sore throat.   Eyes:  Negative for blurred vision.  Respiratory:  Negative for shortness of breath.   Cardiovascular:  Negative for chest pain, palpitations, orthopnea, claudication, leg swelling and PND.  Gastrointestinal:  Negative for nausea and vomiting.  Genitourinary:  Negative for flank pain.  Neurological:  Negative for dizziness and loss of consciousness.     EKGs/Labs/Other Studies Reviewed:    The following studies were reviewed today:  TTE 06/04/19: IMPRESSIONS   1. Left ventricular ejection fraction, by estimation, is 60 to 65%. The  left ventricle has normal function. The left ventricle has no regional  wall motion abnormalities. There is mild left ventricular hypertrophy.  Left ventricular diastolic parameters  are consistent with Grade I diastolic dysfunction (impaired relaxation).  The average left ventricular global longitudinal strain is -21.8 %.   2. Right ventricular systolic function is normal. The right ventricular  size is normal.   3. The mitral valve is abnormal. Trivial mitral valve regurgitation.   4. The aortic valve has been repaired/replaced. Aortic valve  regurgitation is mild. Mild aortic valve  sclerosis is present, with no  evidence of aortic valve stenosis.   Comparison(s): No significant change from prior study. 04/12/15: LVEF  60-65%, mild AI.  Holter 2018: Sinus bradycardia to sinus rhythm. Frequent PACs and PVC. 1 run of atrial tachycardia lasting 4 beats.   Frequent PACs and PVCs but no significant arrhythmias.  Nuc 2017: Nuclear stress EF: 61%. Blood pressure demonstrated a hypertensive response to exercise. There was no ST segment deviation noted during stress. The study is normal. This is a low risk study. The left ventricular ejection fraction is normal (55-65%).   EKG: No new tracing   Recent Labs: 07/21/2022: ALT 41; BUN 19; Creat 1.07; Hemoglobin 14.3; Platelets 168; Potassium 3.3; Sodium 141  Recent Lipid Panel    Component Value Date/Time   CHOL 160 07/21/2022 0813   CHOL 159 08/24/2021 0928   TRIG 180 (H) 07/21/2022 0813   HDL 46 07/21/2022 0813   HDL 40 08/24/2021 0928   CHOLHDL 3.5 07/21/2022 0813   LDLCALC 86 07/21/2022 0813     Risk Assessment/Calculations:           Physical Exam:    VS:  BP (!) 146/78   Pulse 61   Ht 5\' 11"  (1.803 m)   Wt 179 lb 6.4 oz (81.4 kg)   SpO2 96%   BMI 25.02 kg/m     Wt Readings from Last 3 Encounters:  09/14/22 179 lb 6.4 oz (81.4 kg)  07/21/22 177 lb 6.4 oz (80.5 kg)  04/04/22 179 lb 9.6 oz (81.5 kg)     GEN:  Comfortable, well appearing HEENT: Normal NECK: No JVD; No carotid bruits CARDIAC: RRR, 1-2/6 systolic murmur RUBS. No rubs or gallops RESPIRATORY:  Clear bilaterally ABDOMEN: Soft, non-tender, non-distended MUSCULOSKELETAL:  No edema; No deformity  SKIN: Warm and dry NEUROLOGIC:  Alert and oriented x 3 PSYCHIATRIC:  Normal affect   ASSESSMENT:    1. Essential hypertension   2. PVC's (premature ventricular contractions)   3. Mixed hyperlipidemia   4. Mild aortic insufficiency   5. Hyperlipidemia, unspecified hyperlipidemia type    PLAN:    In order of problems listed  above:  #HTN: Controlled and at goal at  home. -Continue metop 25mg  XL daily -Continue amlodipine 5mg  daily -Continue triamterene-HCTZ 37.5-25mg  daily   #HLD: #Statin Intolerance: Did not tolerate statins due to significant muscle cramps. Specifically did not tolerate prava or rosuva. Followed by lipid clinic and now on zetia. -Continue zetia 10mg  daily -LDL 86; goal<100 in 08/2020 -Check lipid panel when fasting   #Mild AI: Stable on TTE in 05/2022.  -Continue serial monitoring   #PACs/PVCs: Controlled on metoprolol. -Continue metop 25mg  daily        Follow up: 6 months.  Medication Adjustments/Labs and Tests Ordered: Current medicines are reviewed at length with the patient today.  Concerns regarding medicines are outlined above.  No orders of the defined types were placed in this encounter.  No orders of the defined types were placed in this encounter.   Patient Instructions  Medication Instructions:   Your physician recommends that you continue on your current medications as directed. Please refer to the Current Medication list given to you today.  *If you need a refill on your cardiac medications before your next appointment, please call your pharmacy*    Follow-Up: At [facility], you and your health needs are our priority.  As part of our continuing mission to provide you with exceptional heart care, we have created designated Provider Care Teams.  These Care Teams include your primary Cardiologist (physician) and Advanced Practice Providers (APPs -  Physician Assistants and Nurse Practitioners) who all work together to provide you with the care you need, when you need it.  We recommend signing up for the patient portal called "MyChart".  Sign up information is provided on this After Visit Summary.  MyChart is used to connect with patients for Virtual Visits (Telemedicine).  Patients are able to view lab/test results, encounter notes, upcoming  appointments, etc.  Non-urgent messages can be sent to your provider as well.   To learn more about what you can do with MyChart, go to [email or web address].    Your next appointment:   6 month(s)  Provider:   Jodelle Red, MD         Signed, Meriam Sprague, MD  09/14/2022 10:11 AM    [facility]

## 2022-09-14 ENCOUNTER — Encounter: Payer: Self-pay | Admitting: Cardiology

## 2022-09-14 ENCOUNTER — Ambulatory Visit: Payer: Medicare Other | Attending: Cardiology | Admitting: Cardiology

## 2022-09-14 VITALS — BP 146/78 | HR 61 | Ht 71.0 in | Wt 179.4 lb

## 2022-09-14 DIAGNOSIS — I351 Nonrheumatic aortic (valve) insufficiency: Secondary | ICD-10-CM

## 2022-09-14 DIAGNOSIS — I10 Essential (primary) hypertension: Secondary | ICD-10-CM | POA: Diagnosis not present

## 2022-09-14 DIAGNOSIS — I493 Ventricular premature depolarization: Secondary | ICD-10-CM | POA: Diagnosis not present

## 2022-09-14 DIAGNOSIS — E782 Mixed hyperlipidemia: Secondary | ICD-10-CM

## 2022-09-14 DIAGNOSIS — E785 Hyperlipidemia, unspecified: Secondary | ICD-10-CM

## 2022-09-14 NOTE — Patient Instructions (Signed)
Medication Instructions:  Your physician recommends that you continue on your current medications as directed. Please refer to the Current Medication list given to you today.  *If you need a refill on your cardiac medications before your next appointment, please call your pharmacy*  Follow-Up: At  HeartCare, you and your health needs are our priority.  As part of our continuing mission to provide you with exceptional heart care, we have created designated Provider Care Teams.  These Care Teams include your primary Cardiologist (physician) and Advanced Practice Providers (APPs -  Physician Assistants and Nurse Practitioners) who all work together to provide you with the care you need, when you need it.  We recommend signing up for the patient portal called "MyChart".  Sign up information is provided on this After Visit Summary.  MyChart is used to connect with patients for Virtual Visits (Telemedicine).  Patients are able to view lab/test results, encounter notes, upcoming appointments, etc.  Non-urgent messages can be sent to your provider as well.   To learn more about what you can do with MyChart, go to [email or web address].    Your next appointment:   6 month(s)  Provider:   Bridgette Christopher, MD    

## 2022-09-25 ENCOUNTER — Encounter (HOSPITAL_BASED_OUTPATIENT_CLINIC_OR_DEPARTMENT_OTHER): Payer: Self-pay

## 2022-09-25 ENCOUNTER — Other Ambulatory Visit: Payer: Self-pay

## 2022-09-25 ENCOUNTER — Emergency Department (HOSPITAL_BASED_OUTPATIENT_CLINIC_OR_DEPARTMENT_OTHER)
Admission: EM | Admit: 2022-09-25 | Discharge: 2022-09-25 | Disposition: A | Discharge status: Home / Self Care | Payer: Medicare Other | Attending: Emergency Medicine | Admitting: Emergency Medicine

## 2022-09-25 ENCOUNTER — Emergency Department (HOSPITAL_BASED_OUTPATIENT_CLINIC_OR_DEPARTMENT_OTHER): Payer: Medicare Other | Admitting: Radiology

## 2022-09-25 DIAGNOSIS — R0602 Shortness of breath: Secondary | ICD-10-CM | POA: Diagnosis not present

## 2022-09-25 DIAGNOSIS — R0789 Other chest pain: Secondary | ICD-10-CM | POA: Diagnosis not present

## 2022-09-25 DIAGNOSIS — R079 Chest pain, unspecified: Secondary | ICD-10-CM | POA: Diagnosis not present

## 2022-09-25 DIAGNOSIS — R9431 Abnormal electrocardiogram [ECG] [EKG]: Secondary | ICD-10-CM | POA: Diagnosis not present

## 2022-09-25 DIAGNOSIS — I10 Essential (primary) hypertension: Secondary | ICD-10-CM | POA: Diagnosis not present

## 2022-09-25 DIAGNOSIS — Z79899 Other long term (current) drug therapy: Secondary | ICD-10-CM | POA: Insufficient documentation

## 2022-09-25 DIAGNOSIS — R110 Nausea: Secondary | ICD-10-CM | POA: Diagnosis not present

## 2022-09-25 LAB — CBC WITH DIFFERENTIAL/PLATELET
Abs Immature Granulocytes: 0.02 10*3/uL (ref 0.00–0.07)
Basophils Absolute: 0.1 10*3/uL (ref 0.0–0.1)
Basophils Relative: 1 %
Eosinophils Absolute: 0.2 10*3/uL (ref 0.0–0.5)
Eosinophils Relative: 3 %
HCT: 47.6 % (ref 39.0–52.0)
Hemoglobin: 16.1 g/dL (ref 13.0–17.0)
Immature Granulocytes: 0 %
Lymphocytes Relative: 30 %
Lymphs Abs: 1.5 10*3/uL (ref 0.7–4.0)
MCH: 30.3 pg (ref 26.0–34.0)
MCHC: 33.8 g/dL (ref 30.0–36.0)
MCV: 89.5 fL (ref 80.0–100.0)
Monocytes Absolute: 0.2 10*3/uL (ref 0.1–1.0)
Monocytes Relative: 4 %
Neutro Abs: 3 10*3/uL (ref 1.7–7.7)
Neutrophils Relative %: 62 %
Platelets: 162 10*3/uL (ref 150–400)
RBC: 5.32 MIL/uL (ref 4.22–5.81)
RDW: 14.5 % (ref 11.5–15.5)
WBC: 4.9 10*3/uL (ref 4.0–10.5)
nRBC: 0 % (ref 0.0–0.2)

## 2022-09-25 LAB — BASIC METABOLIC PANEL
Anion gap: 10 (ref 5–15)
BUN: 17 mg/dL (ref 8–23)
CO2: 27 mmol/L (ref 22–32)
Calcium: 10 mg/dL (ref 8.9–10.3)
Chloride: 100 mmol/L (ref 98–111)
Creatinine, Ser: 1 mg/dL (ref 0.61–1.24)
GFR, Estimated: >60 mL/min (ref >60)
Glucose, Bld: 161 mg/dL — ABNORMAL HIGH (ref 70–99)
Potassium: 3.7 mmol/L (ref 3.5–5.1)
Sodium: 137 mmol/L (ref 135–145)

## 2022-09-25 LAB — BRAIN NATRIURETIC PEPTIDE: B Natriuretic Peptide: 51.1 pg/mL (ref 0.0–100.0)

## 2022-09-25 LAB — TROPONIN I (HIGH SENSITIVITY)
Troponin I (High Sensitivity): 5 ng/L (ref <18)
Troponin I (High Sensitivity): 5 ng/L (ref <18)

## 2022-09-25 NOTE — ED Triage Notes (Signed)
Patient here POV from Home.  Endorses Consistent Chest Discomfort for about 10 Hours. Mostly Generalized. Noted some HTN as well. Some SOB as well that may be worse when flat.  NAD Noted during triage. A&Ox4. GCS 15. Ambulatory.

## 2022-09-25 NOTE — ED Provider Notes (Addendum)
EMERGENCY DEPARTMENT AT [facility] Provider Note   CSN: [id number] Arrival date & time: 09/25/22  1226     History  Chief Complaint  Patient presents with   Chest Pain    Joel Bauer is a 89 y.o. male.  Patient today with intermittent discomfort in the central chest area little bit to the left for the last 10 hours has occurred about 5 or 6 times only last for 5 or 10 minutes.  Not associated with nausea not associated with any radiation patient denied any shortness of breath to me.  Denies any leg swelling.  Says that he also thought that maybe there was a feeling of some palpitations associated with it.  Patient is followed by [facility] cardiology.  Patient is not on a blood thinner or baby aspirin.  Does have a history of hypertension.  Has been taking his 3 hypertensive meds.  Patient states his blood pressure at home was very high at 180 systolic but here pressures have been much better currently is just 138 systolic.  Patient is not a smoker.  Past medical history significant for the hypertension aortic regurgitation was mild in 2017.  Diverticulitis of large intestine in 2017 had a small bowel obstruction in 2018.  Was evaluated for chest pain in 2017 but that was deemed to be atypical chest pain.       Home Medications Prior to Admission medications   Medication Sig Start Date End Date Taking? Authorizing Provider  amLODipine (NORVASC) 5 MG tablet Take 1 tablet (5 mg total) by mouth daily. 12/27/21   Meriam Sprague, MD  ezetimibe (ZETIA) 10 MG tablet Take 1 tablet (10 mg total) by mouth daily. 02/17/21 09/14/22  Meriam Sprague, MD  fluticasone (FLONASE) 50 MCG/ACT nasal spray Place 1 spray into both nostrils daily as needed for allergies or rhinitis.    [provider]  hydroxypropyl methylcellulose / hypromellose (ISOPTO TEARS / GONIOVISC) 2.5 % ophthalmic solution Place 1 drop into both eyes 3 (three) times daily as needed for dry eyes.     [provider]  metoprolol succinate (TOPROL-XL) 25 MG 24 hr tablet TAKE 1 TABLET BY MOUTH EVERY DAY 01/12/22   Donita Brooks, MD  multivitamin-lutein [facility]) CAPS capsule Take 1 capsule by mouth daily.    [provider]  omeprazole (PRILOSEC) 40 MG capsule TAKE 1 CAPSULE(40 MG) BY MOUTH DAILY 08/24/22   Donita Brooks, MD  potassium chloride (KLOR-CON M) 10 MEQ tablet Take 1 tablet (10 mEq total) by mouth 2 (two) times daily. 07/31/22   Donita Brooks, MD  primidone (MYSOLINE) 50 MG tablet Take 1 tablet (50 mg total) by mouth 3 (three) times daily. 08/29/22   Donita Brooks, MD  triamterene-hydrochlorothiazide (DYAZIDE) 37.5-25 MG capsule TAKE 1 CAPSULE BY MOUTH EVERY DAY 08/15/22   Meriam Sprague, MD      Allergies    Rosuvastatin    Review of Systems   Review of Systems  Constitutional:  Negative for chills and fever.  HENT:  Negative for ear pain and sore throat.   Eyes:  Negative for pain and visual disturbance.  Respiratory:  Negative for cough and shortness of breath.   Cardiovascular:  Positive for chest pain and palpitations.  Gastrointestinal:  Negative for abdominal pain and vomiting.  Genitourinary:  Negative for dysuria and hematuria.  Musculoskeletal:  Negative for arthralgias and back pain.  Skin:  Negative for color change and rash.  Neurological:  Negative for seizures and syncope.  All other systems reviewed and are negative.   Physical Exam Updated Vital Signs BP (!) 150/69 (BP Location: Right Arm)   Pulse 69   Temp 98.7 F (37.1 C) (Oral)   Resp 18   Ht 1.803 m (5\' 11" )   Wt 79.4 kg   SpO2 98%   BMI 24.41 kg/m  Physical Exam Vitals and nursing note reviewed.  Constitutional:      General: He is not in acute distress.    Appearance: Normal appearance. He is well-developed.  HENT:     Head: Normocephalic and atraumatic.  Eyes:     Extraocular Movements: Extraocular movements intact.     Conjunctiva/sclera:  Conjunctivae normal.     Pupils: Pupils are equal, round, and reactive to light.  Cardiovascular:     Rate and Rhythm: Normal rate and regular rhythm.     Heart sounds: No murmur heard. Pulmonary:     Effort: Pulmonary effort is normal. No respiratory distress.     Breath sounds: Normal breath sounds. No wheezing, rhonchi or rales.  Chest:     Chest wall: No tenderness.  Abdominal:     Palpations: Abdomen is soft.     Tenderness: There is no abdominal tenderness.  Musculoskeletal:        General: No swelling.     Cervical back: Normal range of motion and neck supple.     Right lower leg: No edema.     Left lower leg: No edema.  Skin:    General: Skin is warm and dry.     Capillary Refill: Capillary refill takes less than 2 seconds.  Neurological:     General: No focal deficit present.     Mental Status: He is alert and oriented to person, place, and time.  Psychiatric:        Mood and Affect: Mood normal.     ED Results / Procedures / Treatments   Labs (all labs ordered are listed, but only abnormal results are displayed) Labs Reviewed  BASIC METABOLIC PANEL - Abnormal; Notable for the following components:      Result Value   Glucose, Bld 161 (*)    All other components within normal limits  CBC WITH DIFFERENTIAL/PLATELET  BRAIN NATRIURETIC PEPTIDE  TROPONIN I (HIGH SENSITIVITY)  TROPONIN I (HIGH SENSITIVITY)    EKG EKG Interpretation Date/Time:  Monday September 25 2022 12:36:24 EDT Ventricular Rate:  72 PR Interval:  160 QRS Duration:  90 QT Interval:  390 QTC Calculation: 427 R Axis:   11  Text Interpretation: Normal sinus rhythm Anterior infarct , age undetermined Abnormal ECG When compared with ECG of 23-Mar-2015 13:18, PREVIOUS ECG IS PRESENT No significant change since last tracing Confirmed by Vanetta Mulders [phone]) on 09/25/2022 3:20:09 PM  Radiology DG Chest 2 View  Result Date: 09/25/2022 CLINICAL DATA:  Chest pain, hypertension and shortness of breath.  EXAM: CHEST - 2 VIEW COMPARISON:  03/23/2015 FINDINGS: The heart size and mediastinal contours are within normal limits. There is no evidence of pulmonary edema, consolidation, pneumothorax, nodule or pleural fluid. The visualized skeletal structures are unremarkable. IMPRESSION: No active cardiopulmonary disease. Electronically Signed   By: Irish Lack M.D.   On: 09/25/2022 13:13    Procedures Procedures    Medications Ordered in ED Medications - No data to display  ED Course/ Medical Decision Making/ A&P  Medical Decision Making  Patient's first troponin was very normal at 5.  BNP was also very normal at 51.  Chest x-ray had no acute findings.  EKG very similar to the EKG in 2017.  Patient's basic metabolic panel electrolytes were normal renal function was normal CBC white count 4.9 hemoglobin 16.1 platelets 162.  If delta troponin is not significantly changed patient be stable for discharge home with warnings to return for chest pain lasting 15 minutes or longer.  Or any chest discomfort.  Will have patient follow-up with his cardiologist.  Also would recommend checking daily blood pressure.  Keeping a log.  Delta troponin was 5 as well no significant change.  Patient should be stable for discharge home follow-up with blood pressure and the chest discomfort with cardiology with precautions.  Final Clinical Impression(s) / ED Diagnoses Final diagnoses:  Atypical chest pain    Rx / DC Orders ED Discharge Orders     None         Vanetta Mulders, MD 09/25/22 1552    Vanetta Mulders, MD 09/25/22 1616

## 2022-09-25 NOTE — Discharge Instructions (Signed)
Make an appointment follow-up with your cardiologist.  Return for any chest discomfort or pain lasting 15 minutes or longer.  Would also recommend starting a baby aspirin a day.  Also recommend keeping a log of your blood pressure daily and then following up with your primary care doctor or cardiology to see if you need any adjustments in your meds.  Blood pressure improved here significantly.

## 2022-09-28 ENCOUNTER — Ambulatory Visit (INDEPENDENT_AMBULATORY_CARE_PROVIDER_SITE_OTHER): Payer: Medicare Other

## 2022-09-28 DIAGNOSIS — Z23 Encounter for immunization: Secondary | ICD-10-CM

## 2022-09-28 NOTE — Progress Notes (Signed)
Pt came for 2nd vaccine shingrix injection given on L-arm, pt tol well and no c/o per pt

## 2022-10-02 ENCOUNTER — Other Ambulatory Visit: Payer: Self-pay | Admitting: *Deleted

## 2022-10-02 MED ORDER — AMLODIPINE BESYLATE 5 MG PO TABS: 5.0000 mg | ORAL_TABLET | Freq: Every day | ORAL | 3 refills | Status: DC

## 2022-10-17 ENCOUNTER — Ambulatory Visit (INDEPENDENT_AMBULATORY_CARE_PROVIDER_SITE_OTHER): Payer: Medicare Other | Admitting: Family Medicine

## 2022-10-17 VITALS — BP 136/72 | HR 68 | Temp 98.2°F | Ht 71.0 in | Wt 181.0 lb

## 2022-10-17 DIAGNOSIS — I10 Essential (primary) hypertension: Secondary | ICD-10-CM | POA: Diagnosis not present

## 2022-10-17 DIAGNOSIS — L570 Actinic keratosis: Secondary | ICD-10-CM

## 2022-10-17 MED ORDER — AMLODIPINE BESYLATE 10 MG PO TABS: 10.0000 mg | ORAL_TABLET | Freq: Every day | ORAL | 3 refills | Status: DC

## 2022-10-17 NOTE — Progress Notes (Signed)
Subjective:    Patient ID: Joel Bauer, male    DOB: [birth year], 89 y.o.   MRN: [id number] Patient has several lesions on his face and neck that he is concerned about.  Under the right mandible, there is a 6 mm seborrheic keratosis that is irritated and causes him pain when he shaves.  Under the left mandible there is a 5 mm erythematous wartlike papule that I believe may be an AK because of some similar problems.  On his forehead there are 2 erythematous crusty hyperkeratotic papules that are each about 3 mm in diameter.  These are located just above the bridge of the nose.  On the dorsal surface of his right forearm there is a 6 mm erythematous scaly papule that appears to be an AK versus SCC.  Patient's blood pressure is also been between 140 and 160/70 recently  Past Medical History:  Diagnosis Date   Adenomatous colon polyp 2001   Anemia    as a child    Aortic regurgitation    a. mild by echo 03/2015   Arthritis    Cancer (HCC)    hx of skin cancer    Chest pain 03/31/2015   Diverticulitis large intestine 04/19/2015   Diverticulitis of colon - recurrent/persistent 12/29/2014   Diverticulosis of colon (without mention of hemorrhage) 2005   GERD (gastroesophageal reflux disease)    History of kidney stones    History of skin cancer    Hyperlipidemia    Hypertension    Hypertensive heart disease    LLQ pain 11/26/2014   Macular degeneration    left eye   Orthostasis    Pleurisy    Premature atrial contractions    Prostatitis    PVC's (premature ventricular contractions)    Recurrent colitis due to Clostridium difficile    Retinal hemorrhage    SCC (squamous cell carcinoma) 11/13/2018   RIGHT FOREARM CX3 5FU   Shingles    Small bowel obstruction (HCC) 05/31/2016   Squamous cell carcinoma of skin 07/23/2017   SCC IN SITU LEFT UPPER NOSE BRIDGE CX3 5FU CAUTERY   Past Surgical History:  Procedure Laterality Date   CATARACTS REMOVED     COLONOSCOPY      COLONOSCOPY N/A 06/15/2014   Procedure: COLONOSCOPY;  Surgeon: Iva Boop, MD;  Location: [facility] ENDOSCOPY;  Service: Endoscopy;  Laterality: N/A;   CYSTOSCOPY WITH RETROGRADE PYELOGRAM, URETEROSCOPY AND STENT PLACEMENT Right 10/18/2013   Procedure: CYSTOSCOPY WITH RIGHT RETROGRADE/RIGHT URETEROSCOPY, STONE BASKETRY,  AND STENT PLACEMENT RIGHT;  Surgeon: Sebastian Ache, MD;  Location: [facility] ORS;  Service: Urology;  Laterality: Right;   EYE SURGERY     3 injections to left eye for macular degeneration    FMT  06/2014   LAPAROSCOPIC LOW ANTERIOR RESECTION N/A 04/19/2015   Procedure: LAPAROSCOPIC ASSISTED  LOW ANTERIOR RESECTION, splenic flexure mobilization;  Surgeon: Claud Kelp, MD;  Location: [facility] ORS;  Service: General;  Laterality: N/A;   LITHOTRIPSY     TONSILLECTOMY     Current Outpatient Medications on File Prior to Visit  Medication Sig Dispense Refill   amLODipine (NORVASC) 5 MG tablet Take 1 tablet (5 mg total) by mouth daily. 90 tablet 3   ezetimibe (ZETIA) 10 MG tablet Take 1 tablet (10 mg total) by mouth daily. 30 tablet 11   fluticasone (FLONASE) 50 MCG/ACT nasal spray Place 1 spray into both nostrils daily as needed for allergies or rhinitis.  hydroxypropyl methylcellulose / hypromellose (ISOPTO TEARS / GONIOVISC) 2.5 % ophthalmic solution Place 1 drop into both eyes 3 (three) times daily as needed for dry eyes.     metoprolol succinate (TOPROL-XL) 25 MG 24 hr tablet TAKE 1 TABLET BY MOUTH EVERY DAY 90 tablet 3   multivitamin-lutein (OCUVITE-LUTEIN) CAPS capsule Take 1 capsule by mouth daily.     omeprazole (PRILOSEC) 40 MG capsule TAKE 1 CAPSULE(40 MG) BY MOUTH DAILY 90 capsule 0   potassium chloride (KLOR-CON M) 10 MEQ tablet Take 1 tablet (10 mEq total) by mouth 2 (two) times daily. 90 tablet 1   primidone (MYSOLINE) 50 MG tablet Take 1 tablet (50 mg total) by mouth 3 (three) times daily. 270 tablet 3   triamterene-hydrochlorothiazide (DYAZIDE) 37.5-25 MG capsule TAKE 1 CAPSULE BY  MOUTH EVERY DAY 90 capsule 2   No current facility-administered medications on file prior to visit.   Allergies  Allergen Reactions   Rosuvastatin Other (See Comments)    Pt reports causes muscle cramps in legs   Social History   Socioeconomic History   Marital status: Widowed    Spouse name: Not on file   Number of children: 1   Years of education: Not on file   Highest education level: Not on file  Occupational History   Occupation: Retired   Tobacco Use   Smoking status: Never   Smokeless tobacco: Never  Vaping Use   Vaping status: Never Used  Substance and Sexual Activity   Alcohol use: No    Alcohol/week: 0.0 standard drinks of alcohol   Drug use: No   Sexual activity: Not on file  Other Topics Concern   Not on file  Social History Narrative   He is retired, widowed and has 1 child.   He does not use alcohol tobacco or drugs   Social Determinants of Health   Financial Resource Strain: Low Risk  (04/04/2022)   Overall Financial Resource Strain (CARDIA)    Difficulty of Paying Living Expenses: Not hard at all  Food Insecurity: No Food Insecurity (04/04/2022)   Hunger Vital Sign    Worried About Running Out of Food in the Last Year: Never true    Ran Out of Food in the Last Year: Never true  Transportation Needs: No Transportation Needs (04/04/2022)   PRAPARE - Administrator, Civil Service (Medical): No    Lack of Transportation (Non-Medical): No  Physical Activity: Insufficiently Active (04/04/2022)   Exercise Vital Sign    Days of Exercise per Week: 4 days    Minutes of Exercise per Session: 30 min  Stress: No Stress Concern Present (04/04/2022)   [employer] of Occupational Health - Occupational Stress Questionnaire    Feeling of Stress : Not at all  Social Connections: Moderately Isolated (04/04/2022)   Social Connection and Isolation Panel [NHANES]    Frequency of Communication with Friends and Family: More than three times a week     Frequency of Social Gatherings with Friends and Family: Twice a week    Attends Religious Services: 1 to 4 times per year    Active Member of [employer] or Organizations: No    Attends Banker Meetings: Never    Marital Status: Widowed  Intimate Partner Violence: Not At Risk (04/04/2022)   Humiliation, Afraid, Rape, and Kick questionnaire    Fear of Current or Ex-Partner: No    Emotionally Abused: No    Physically Abused: No    Sexually Abused: No  Review of Systems  All other systems reviewed and are negative.      Objective:   Physical Exam Vitals reviewed.  Constitutional:      General: He is not in acute distress.    Appearance: Normal appearance. He is well-developed and normal weight. He is not ill-appearing, toxic-appearing or diaphoretic.  HENT:     Head:      Right Ear: Tympanic membrane and ear canal normal.     Left Ear: Tympanic membrane and ear canal normal.     Nose: Nose normal. No congestion or rhinorrhea.     Mouth/Throat:     Pharynx: No oropharyngeal exudate or posterior oropharyngeal erythema.  Eyes:     Extraocular Movements: Extraocular movements intact.     Conjunctiva/sclera: Conjunctivae normal.     Pupils: Pupils are equal, round, and reactive to light.  Neck:     Vascular: No carotid bruit or JVD.  Cardiovascular:     Rate and Rhythm: Normal rate and regular rhythm.     Heart sounds: Murmur heard.     No friction rub. No gallop.  Pulmonary:     Effort: Pulmonary effort is normal. No respiratory distress.     Breath sounds: Normal breath sounds. No stridor. No wheezing, rhonchi or rales.  Chest:     Chest wall: No tenderness.  Abdominal:     General: Bowel sounds are normal. There is no distension.     Palpations: Abdomen is soft. There is no mass.     Tenderness: There is no abdominal tenderness. There is no guarding or rebound.  Musculoskeletal:        General: No swelling, tenderness, deformity or signs of injury.        Arms:     Cervical back: Neck supple. No rigidity or tenderness.     Right lower leg: No edema.     Left lower leg: No edema.  Lymphadenopathy:     Cervical: No cervical adenopathy.  Skin:    Coloration: Skin is not jaundiced or pale.     Findings: No bruising, erythema, lesion or rash.  Neurological:     General: No focal deficit present.     Mental Status: He is alert and oriented to person, place, and time. Mental status is at baseline.     Cranial Nerves: No cranial nerve deficit.     Sensory: No sensory deficit.     Motor: No weakness.     Coordination: Coordination normal.     Gait: Gait normal.     Deep Tendon Reflexes: Reflexes normal.  Psychiatric:        Mood and Affect: Mood normal.        Behavior: Behavior normal.        Thought Content: Thought content normal.           Assessment & Plan:  Benign essential HTN  Multiple actinic keratoses I treated each of the 5 lesions described in the history of present illness with liquid nitrogen cryotherapy for 30 seconds each.  Any lesion that persists with marked biopsy.  Recommended trying Efudex in the fall due to the numerous small AK's on his face.  Due to his high blood pressure I have recommended increasing amlodipine to 10 mg a day

## 2022-10-20 ENCOUNTER — Telehealth: Payer: Self-pay

## 2022-10-20 NOTE — Telephone Encounter (Signed)
Called and LVM for pt to either call or come in the office to sign the 'pt acknowledgement form' for the shingrix vaccine for date, 09/28/22.   Put up front in pt files drawers

## 2022-10-27 ENCOUNTER — Other Ambulatory Visit: Payer: Self-pay | Admitting: Family Medicine

## 2022-10-27 NOTE — Telephone Encounter (Signed)
Requested Prescriptions  Pending Prescriptions Disp Refills   potassium chloride (KLOR-CON M) 10 MEQ tablet [Pharmacy Med Name: POTASSIUM CL MICRO ER TABS] 180 tablet 0    Sig: TAKE 1 TABLET(10 MEQ) BY MOUTH TWICE DAILY     Endocrinology:  Minerals - Potassium Supplementation Failed - 10/27/2022  3:35 AM      Failed - Valid encounter within last 12 months    Recent Outpatient Visits           1 year ago Benign essential HTN   [facility] Donita Brooks, MD   2 years ago Hypertensive heart disease without heart failure   [facility] Tanya Nones, Priscille Heidelberg, MD   3 years ago Benign essential HTN   [facility] Tanya Nones, Priscille Heidelberg, MD   5 years ago Bacterial respiratory infection   [facility] Dorena Bodo, PA-C   5 years ago Chronic fatigue   [facility] [name], Priscille Heidelberg, MD              Passed - K in normal range and within 360 days    Potassium  Date Value Ref Range Status  09/25/2022 3.7 3.5 - 5.1 mmol/L Final         Passed - Cr in normal range and within 360 days    Creat  Date Value Ref Range Status  07/21/2022 1.07 0.70 - 1.22 mg/dL Final   Creatinine, Ser  Date Value Ref Range Status  09/25/2022 1.00 0.61 - 1.24 mg/dL Final

## 2022-11-27 ENCOUNTER — Other Ambulatory Visit: Payer: Self-pay | Admitting: Family Medicine

## 2022-11-27 DIAGNOSIS — K219 Gastro-esophageal reflux disease without esophagitis: Secondary | ICD-10-CM

## 2022-11-29 NOTE — Telephone Encounter (Signed)
Last OV 10/17/22.  Requested Prescriptions  Pending Prescriptions Disp Refills   omeprazole (PRILOSEC) 40 MG capsule [Pharmacy Med Name: OMEPRAZOLE 40MG  CAPSULES] 90 capsule 0    Sig: TAKE 1 CAPSULE(40 MG) BY MOUTH DAILY     Gastroenterology: Proton Pump Inhibitors Failed - 11/27/2022  3:55 PM      Failed - Valid encounter within last 12 months    Recent Outpatient Visits           1 year ago Benign essential HTN   [facility] Donita Brooks, MD   2 years ago Hypertensive heart disease without heart failure   [facility] Donita Brooks, MD   3 years ago Benign essential HTN   [facility] Tanya Nones Priscille Heidelberg, MD   5 years ago Bacterial respiratory infection   [facility] Dorena Bodo, PA-C   5 years ago Chronic fatigue   [facility] [name], Priscille Heidelberg, MD

## 2022-11-30 DIAGNOSIS — H43811 Vitreous degeneration, right eye: Secondary | ICD-10-CM | POA: Diagnosis not present

## 2022-11-30 DIAGNOSIS — H353222 Exudative age-related macular degeneration, left eye, with inactive choroidal neovascularization: Secondary | ICD-10-CM | POA: Diagnosis not present

## 2022-11-30 DIAGNOSIS — H353211 Exudative age-related macular degeneration, right eye, with active choroidal neovascularization: Secondary | ICD-10-CM | POA: Diagnosis not present

## 2022-11-30 DIAGNOSIS — H43391 Other vitreous opacities, right eye: Secondary | ICD-10-CM | POA: Diagnosis not present

## 2022-11-30 DIAGNOSIS — H35033 Hypertensive retinopathy, bilateral: Secondary | ICD-10-CM | POA: Diagnosis not present

## 2022-11-30 DIAGNOSIS — H35433 Paving stone degeneration of retina, bilateral: Secondary | ICD-10-CM | POA: Diagnosis not present

## 2022-12-25 DIAGNOSIS — H52203 Unspecified astigmatism, bilateral: Secondary | ICD-10-CM | POA: Diagnosis not present

## 2023-01-06 ENCOUNTER — Other Ambulatory Visit: Payer: Self-pay | Admitting: Family Medicine

## 2023-01-06 DIAGNOSIS — R002 Palpitations: Secondary | ICD-10-CM

## 2023-01-08 NOTE — Telephone Encounter (Signed)
Due to a system glitch the last office visit for this practice is not detected correctly.    LOV 10/17/2022.   Labs in date.    Requested Prescriptions  Pending Prescriptions Disp Refills   metoprolol succinate (TOPROL-XL) 25 MG 24 hr tablet [Pharmacy Med Name: METOPROLOL ER SUCCINATE 25MG  TABS] 90 tablet 0    Sig: TAKE 1 TABLET BY MOUTH EVERY DAY     Cardiovascular:  Beta Blockers Failed - 01/06/2023  3:35 AM      Failed - Valid encounter within last 6 months    Recent Outpatient Visits           1 year ago Benign essential HTN   [facility] Donita Brooks, MD   2 years ago Hypertensive heart disease without heart failure   [facility] Donita Brooks, MD   3 years ago Benign essential HTN   [facility] Tanya Nones, Priscille Heidelberg, MD   5 years ago Bacterial respiratory infection   [facility] Medicine Dorena Bodo, PA-C   6 years ago Chronic fatigue   [facility] [name], Priscille Heidelberg, MD              Passed - Last BP in normal range    BP Readings from Last 1 Encounters:  10/17/22 136/72         Passed - Last Heart Rate in normal range    Pulse Readings from Last 1 Encounters:  10/17/22 68

## 2023-01-22 DIAGNOSIS — L570 Actinic keratosis: Secondary | ICD-10-CM | POA: Diagnosis not present

## 2023-01-22 DIAGNOSIS — L538 Other specified erythematous conditions: Secondary | ICD-10-CM | POA: Diagnosis not present

## 2023-01-22 DIAGNOSIS — L820 Inflamed seborrheic keratosis: Secondary | ICD-10-CM | POA: Diagnosis not present

## 2023-01-22 DIAGNOSIS — D225 Melanocytic nevi of trunk: Secondary | ICD-10-CM | POA: Diagnosis not present

## 2023-01-22 DIAGNOSIS — L814 Other melanin hyperpigmentation: Secondary | ICD-10-CM | POA: Diagnosis not present

## 2023-01-22 DIAGNOSIS — L821 Other seborrheic keratosis: Secondary | ICD-10-CM | POA: Diagnosis not present

## 2023-01-31 DIAGNOSIS — L820 Inflamed seborrheic keratosis: Secondary | ICD-10-CM | POA: Diagnosis not present

## 2023-02-24 ENCOUNTER — Other Ambulatory Visit: Payer: Self-pay | Admitting: Family Medicine

## 2023-02-24 DIAGNOSIS — K219 Gastro-esophageal reflux disease without esophagitis: Secondary | ICD-10-CM

## 2023-02-26 NOTE — Telephone Encounter (Signed)
Requested medication (s) are due for refill today: yes  Requested medication (s) are on the active medication list: yes  Last refill:  11/29/22 #90 0 refills  Future visit scheduled: no   Notes to clinic:  no refills remain. Do you want to refill Rx?     Requested Prescriptions  Pending Prescriptions Disp Refills   omeprazole (PRILOSEC) 40 MG capsule [Pharmacy Med Name: OMEPRAZOLE 40MG  CAPSULES] 90 capsule 0    Sig: TAKE 1 CAPSULE(40 MG) BY MOUTH DAILY     Gastroenterology: Proton Pump Inhibitors Failed - 02/24/2023  8:25 AM      Failed - Valid encounter within last 12 months    Recent Outpatient Visits           1 year ago Benign essential HTN   [facility] Medicine Donita Brooks, MD   2 years ago Hypertensive heart disease without heart failure   [facility] Donita Brooks, MD   3 years ago Benign essential HTN   [facility] Donita Brooks, MD   5 years ago Bacterial respiratory infection   [facility] Medicine Dorena Bodo, PA-C   6 years ago Chronic fatigue   [facility] [name], Priscille Heidelberg, MD       Future Appointments             In 1 month Jodelle Red, MD [facility] at [facility], [place]

## 2023-04-06 ENCOUNTER — Other Ambulatory Visit: Payer: Self-pay | Admitting: Family Medicine

## 2023-04-06 DIAGNOSIS — R002 Palpitations: Secondary | ICD-10-CM

## 2023-04-17 ENCOUNTER — Ambulatory Visit (INDEPENDENT_AMBULATORY_CARE_PROVIDER_SITE_OTHER): Payer: Medicare Other | Admitting: Family Medicine

## 2023-04-17 ENCOUNTER — Encounter: Payer: Self-pay | Admitting: Family Medicine

## 2023-04-17 VITALS — BP 120/62 | HR 67 | Temp 97.6°F | Ht 71.0 in | Wt 183.4 lb

## 2023-04-17 DIAGNOSIS — R5383 Other fatigue: Secondary | ICD-10-CM

## 2023-04-17 DIAGNOSIS — K219 Gastro-esophageal reflux disease without esophagitis: Secondary | ICD-10-CM

## 2023-04-17 DIAGNOSIS — R21 Rash and other nonspecific skin eruption: Secondary | ICD-10-CM | POA: Diagnosis not present

## 2023-04-17 DIAGNOSIS — I10 Essential (primary) hypertension: Secondary | ICD-10-CM | POA: Diagnosis not present

## 2023-04-17 MED ORDER — OMEPRAZOLE 40 MG PO CPDR: 40.0000 mg | DELAYED_RELEASE_CAPSULE | Freq: Every day | ORAL | 3 refills | Status: DC

## 2023-04-17 MED ORDER — MOMETASONE FUROATE 0.1 % EX CREA
TOPICAL_CREAM | Freq: Two times a day (BID) | CUTANEOUS | 2 refills | Status: DC
Start: 2023-04-17 — End: 2023-11-06

## 2023-04-17 MED ORDER — OMEPRAZOLE 40 MG PO CPDR
40.0000 mg | DELAYED_RELEASE_CAPSULE | Freq: Every day | ORAL | 1 refills | Status: DC
Start: 2023-04-17 — End: 2023-04-17

## 2023-04-17 NOTE — Progress Notes (Signed)
Subjective:    Patient ID: Joel Bauer, male    DOB: Mar 09, 1934, 89 y.o.   MRN: [id number] Patient has an erythematous rash on both sides of his neck.  The rash consists of numerous faint erythematous papules and macules.  They are poorly circumscribed.  They have fine white scale.  Patient also has signs of dry skin in that exact area.  Rash is limited to the area around his neck. Patient also complains of muscle weakness and fatigue.  He is interested in possible treatment for hypergonadism.  He is concerned because he feels like his muscles are getting weaker as he gets older and he is losing his stamina.  He wonders if taking testosterone would help slow this process.   Past Medical History:  Diagnosis Date   Adenomatous colon polyp 2001   Anemia    as a child    Aortic regurgitation    a. mild by echo 03/2015   Arthritis    Cancer (HCC)    hx of skin cancer    Chest pain 03/31/2015   Diverticulitis large intestine 04/19/2015   Diverticulitis of colon - recurrent/persistent 12/29/2014   Diverticulosis of colon (without mention of hemorrhage) 2005   GERD (gastroesophageal reflux disease)    History of kidney stones    History of skin cancer    Hyperlipidemia    Hypertension    Hypertensive heart disease    LLQ pain 11/26/2014   Macular degeneration    left eye   Orthostasis    Pleurisy    Premature atrial contractions    Prostatitis    PVC's (premature ventricular contractions)    Recurrent colitis due to Clostridium difficile    Retinal hemorrhage    SCC (squamous cell carcinoma) 11/13/2018   RIGHT FOREARM CX3 5FU   Shingles    Small bowel obstruction (HCC) 05/31/2016   Squamous cell carcinoma of skin 07/23/2017   SCC IN SITU LEFT UPPER NOSE BRIDGE CX3 5FU CAUTERY   Past Surgical History:  Procedure Laterality Date   CATARACTS REMOVED     COLONOSCOPY     COLONOSCOPY N/A 06/15/2014   Procedure: COLONOSCOPY;  Surgeon: Iva Boop, MD;  Location: [facility]  ENDOSCOPY;  Service: Endoscopy;  Laterality: N/A;   CYSTOSCOPY WITH RETROGRADE PYELOGRAM, URETEROSCOPY AND STENT PLACEMENT Right 10/18/2013   Procedure: CYSTOSCOPY WITH RIGHT RETROGRADE/RIGHT URETEROSCOPY, STONE BASKETRY,  AND STENT PLACEMENT RIGHT;  Surgeon: Sebastian Ache, MD;  Location: [facility] ORS;  Service: Urology;  Laterality: Right;   EYE SURGERY     3 injections to left eye for macular degeneration    FMT  06/2014   LAPAROSCOPIC LOW ANTERIOR RESECTION N/A 04/19/2015   Procedure: LAPAROSCOPIC ASSISTED  LOW ANTERIOR RESECTION, splenic flexure mobilization;  Surgeon: Claud Kelp, MD;  Location: [facility] ORS;  Service: General;  Laterality: N/A;   LITHOTRIPSY     TONSILLECTOMY     Current Outpatient Medications on File Prior to Visit  Medication Sig Dispense Refill   amLODipine (NORVASC) 10 MG tablet Take 1 tablet (10 mg total) by mouth daily. 90 tablet 3   fluticasone (FLONASE) 50 MCG/ACT nasal spray Place 1 spray into both nostrils daily as needed for allergies or rhinitis.     hydroxypropyl methylcellulose / hypromellose (ISOPTO TEARS / GONIOVISC) 2.5 % ophthalmic solution Place 1 drop into both eyes 3 (three) times daily as needed for dry eyes.     metoprolol succinate (TOPROL-XL) 25 MG 24 hr tablet TAKE  1 TABLET BY MOUTH EVERY DAY 90 tablet 0   multivitamin-lutein (OCUVITE-LUTEIN) CAPS capsule Take 1 capsule by mouth daily.     primidone (MYSOLINE) 50 MG tablet Take 1 tablet (50 mg total) by mouth 3 (three) times daily. 270 tablet 3   triamterene-hydrochlorothiazide (DYAZIDE) 37.5-25 MG capsule TAKE 1 CAPSULE BY MOUTH EVERY DAY 90 capsule 2   potassium chloride (KLOR-CON M) 10 MEQ tablet TAKE 1 TABLET(10 MEQ) BY MOUTH TWICE DAILY (Patient not taking: Reported on 04/17/2023) 180 tablet 0   No current facility-administered medications on file prior to visit.   Allergies  Allergen Reactions   Rosuvastatin Other (See Comments)    Pt reports causes muscle cramps in legs   Social History    Socioeconomic History   Marital status: Widowed    Spouse name: Not on file   Number of children: 1   Years of education: Not on file   Highest education level: Not on file  Occupational History   Occupation: Retired   Tobacco Use   Smoking status: Never   Smokeless tobacco: Never  Vaping Use   Vaping status: Never Used  Substance and Sexual Activity   Alcohol use: No    Alcohol/week: 0.0 standard drinks of alcohol   Drug use: No   Sexual activity: Not on file  Other Topics Concern   Not on file  Social History Narrative   He is retired, widowed and has 1 child.   He does not use alcohol tobacco or drugs   Social Drivers of Corporate investment banker Strain: Low Risk  (04/04/2022)   Overall Financial Resource Strain (CARDIA)    Difficulty of Paying Living Expenses: Not hard at all  Food Insecurity: No Food Insecurity (04/04/2022)   Hunger Vital Sign    Worried About Running Out of Food in the Last Year: Never true    Ran Out of Food in the Last Year: Never true  Transportation Needs: No Transportation Needs (04/04/2022)   PRAPARE - Administrator, Civil Service (Medical): No    Lack of Transportation (Non-Medical): No  Physical Activity: Insufficiently Active (04/04/2022)   Exercise Vital Sign    Days of Exercise per Week: 4 days    Minutes of Exercise per Session: 30 min  Stress: No Stress Concern Present (04/04/2022)   [employer] of Occupational Health - Occupational Stress Questionnaire    Feeling of Stress : Not at all  Social Connections: Moderately Isolated (04/04/2022)   Social Connection and Isolation Panel [NHANES]    Frequency of Communication with Friends and Family: More than three times a week    Frequency of Social Gatherings with Friends and Family: Twice a week    Attends Religious Services: 1 to 4 times per year    Active Member of [employer] or Organizations: No    Attends Banker Meetings: Never    Marital Status: Widowed   Intimate Partner Violence: Not At Risk (04/04/2022)   Humiliation, Afraid, Rape, and Kick questionnaire    Fear of Current or Ex-Partner: No    Emotionally Abused: No    Physically Abused: No    Sexually Abused: No      Review of Systems  All other systems reviewed and are negative.      Objective:   Physical Exam Vitals reviewed.  Constitutional:      General: He is not in acute distress.    Appearance: Normal appearance. He is well-developed and normal weight. He  is not ill-appearing, toxic-appearing or diaphoretic.  HENT:     Head:   Neck:     Vascular: No carotid bruit or JVD.  Cardiovascular:     Rate and Rhythm: Normal rate and regular rhythm.     Heart sounds: Murmur heard.     No friction rub. No gallop.  Pulmonary:     Effort: Pulmonary effort is normal. No respiratory distress.     Breath sounds: Normal breath sounds. No stridor. No wheezing, rhonchi or rales.  Chest:     Chest wall: No tenderness.  Musculoskeletal:       Arms:     Cervical back: Neck supple. No rigidity or tenderness.  Lymphadenopathy:     Cervical: No cervical adenopathy.  Skin:    Coloration: Skin is not jaundiced or pale.     Findings: Rash present. No bruising, erythema or lesion.  Neurological:     General: No focal deficit present.     Mental Status: He is alert and oriented to person, place, and time. Mental status is at baseline.     Cranial Nerves: No cranial nerve deficit.     Sensory: No sensory deficit.     Motor: No weakness.     Coordination: Coordination normal.     Gait: Gait normal.     Deep Tendon Reflexes: Reflexes normal.  Psychiatric:        Mood and Affect: Mood normal.        Behavior: Behavior normal.        Thought Content: Thought content normal.           Assessment & Plan:  Benign essential HTN - Plan: COMPLETE METABOLIC PANEL WITH GFR, CBC with Differential/Platelet, Lipid panel  Gastroesophageal reflux disease without esophagitis - Plan:  omeprazole (PRILOSEC) 40 MG capsule, DISCONTINUED: omeprazole (PRILOSEC) 40 MG capsule  Fatigue, unspecified type - Plan: Testosterone Total,Free,Bio, Males  Rash Rash looks like atopic dermatitis.  Try treating this with Elocon twice daily for 10 days.  Patient's blood pressure today is excellent.  I will check a CBC a CMP and a lipid panel.  I would like to keep his LDL cholesterol less than 161.  Given his fatigue and muscle weakness I will check a testosterone level along with a CBC.

## 2023-04-18 LAB — CBC WITH DIFFERENTIAL/PLATELET
Absolute Lymphocytes: 1596 {cells}/uL (ref 850–3900)
Absolute Monocytes: 367 {cells}/uL (ref 200–950)
Basophils Absolute: 82 {cells}/uL (ref 0–200)
Basophils Relative: 1.6 %
Eosinophils Absolute: 168 {cells}/uL (ref 15–500)
Eosinophils Relative: 3.3 %
HCT: 45.6 % (ref 38.5–50.0)
Hemoglobin: 15.2 g/dL (ref 13.2–17.1)
MCH: 30.4 pg (ref 27.0–33.0)
MCHC: 33.3 g/dL (ref 32.0–36.0)
MCV: 91.2 fL (ref 80.0–100.0)
MPV: 10.1 fL (ref 7.5–12.5)
Monocytes Relative: 7.2 %
Neutro Abs: 2887 {cells}/uL (ref 1500–7800)
Neutrophils Relative %: 56.6 %
Platelets: 158 10*3/uL (ref 140–400)
RBC: 5 10*6/uL (ref 4.20–5.80)
RDW: 13.9 % (ref 11.0–15.0)
Total Lymphocyte: 31.3 %
WBC: 5.1 10*3/uL (ref 3.8–10.8)

## 2023-04-18 LAB — COMPLETE METABOLIC PANEL WITH GFR
AG Ratio: 2 (calc) (ref 1.0–2.5)
ALT: 44 U/L (ref 9–46)
AST: 29 U/L (ref 10–35)
Albumin: 4.3 g/dL (ref 3.6–5.1)
Alkaline phosphatase (APISO): 54 U/L (ref 35–144)
BUN: 16 mg/dL (ref 7–25)
CO2: 29 mmol/L (ref 20–32)
Calcium: 9.4 mg/dL (ref 8.6–10.3)
Chloride: 98 mmol/L (ref 98–110)
Creat: 1.12 mg/dL (ref 0.70–1.22)
Globulin: 2.1 g/dL (ref 1.9–3.7)
Glucose, Bld: 98 mg/dL (ref 65–99)
Potassium: 3.9 mmol/L (ref 3.5–5.3)
Sodium: 139 mmol/L (ref 135–146)
Total Bilirubin: 0.6 mg/dL (ref 0.2–1.2)
Total Protein: 6.4 g/dL (ref 6.1–8.1)
eGFR: 64 mL/min/{1.73_m2} (ref >=60)

## 2023-04-18 LAB — LIPID PANEL
Cholesterol: 219 mg/dL — ABNORMAL HIGH (ref <200)
HDL: 41 mg/dL (ref >=40)
LDL Cholesterol (Calc): 129 mg/dL — ABNORMAL HIGH
Non-HDL Cholesterol (Calc): 178 mg/dL — ABNORMAL HIGH (ref <130)
Total CHOL/HDL Ratio: 5.3 (calc) — ABNORMAL HIGH (ref <5.0)
Triglycerides: 341 mg/dL — ABNORMAL HIGH (ref <150)

## 2023-04-18 LAB — TESTOSTERONE TOTAL,FREE,BIO, MALES
Albumin: 4.3 g/dL (ref 3.6–5.1)
Sex Hormone Binding: 43 nmol/L (ref 22–77)
Testosterone, Bioavailable: 77.4 ng/dL (ref 15.0–150.0)
Testosterone, Free: 39.3 pg/mL (ref 6.0–73.0)
Testosterone: 375 ng/dL (ref 250–827)

## 2023-04-25 ENCOUNTER — Ambulatory Visit (HOSPITAL_BASED_OUTPATIENT_CLINIC_OR_DEPARTMENT_OTHER): Payer: Medicare Other | Admitting: Cardiology

## 2023-04-25 ENCOUNTER — Encounter (HOSPITAL_BASED_OUTPATIENT_CLINIC_OR_DEPARTMENT_OTHER): Payer: Self-pay | Admitting: Cardiology

## 2023-04-25 VITALS — BP 128/62 | HR 64 | Ht 71.0 in | Wt 182.5 lb

## 2023-04-25 DIAGNOSIS — Z789 Other specified health status: Secondary | ICD-10-CM | POA: Diagnosis not present

## 2023-04-25 DIAGNOSIS — E782 Mixed hyperlipidemia: Secondary | ICD-10-CM | POA: Diagnosis not present

## 2023-04-25 DIAGNOSIS — I493 Ventricular premature depolarization: Secondary | ICD-10-CM

## 2023-04-25 DIAGNOSIS — I351 Nonrheumatic aortic (valve) insufficiency: Secondary | ICD-10-CM | POA: Diagnosis not present

## 2023-04-25 DIAGNOSIS — I10 Essential (primary) hypertension: Secondary | ICD-10-CM

## 2023-04-25 NOTE — Patient Instructions (Signed)
 Medication Instructions:  Your physician recommends that you continue on your current medications as directed. Please refer to the Current Medication list given to you today.  *If you need a refill on your cardiac medications before your next appointment, please call your pharmacy*  Lab Work: NONE  Testing/Procedures: NONE  Follow-Up: At [facility], you and your health needs are our priority.  As part of our continuing mission to provide you with exceptional heart care, we have created designated Provider Care Teams.  These Care Teams include your primary Cardiologist (physician) and Advanced Practice Providers (APPs -  Physician Assistants and Nurse Practitioners) who all work together to provide you with the care you need, when you need it.  We recommend signing up for the patient portal called "MyChart".  Sign up information is provided on this After Visit Summary.  MyChart is used to connect with patients for Virtual Visits (Telemedicine).  Patients are able to view lab/test results, encounter notes, upcoming appointments, etc.  Non-urgent messages can be sent to your provider as well.   To learn more about what you can do with MyChart, go to [email or web address].    Your next appointment:   12 month(s)  Provider:   Jodelle Red, MD

## 2023-04-25 NOTE — Progress Notes (Signed)
  Cardiology Office Note:  .   Date:  04/25/2023  ID:  [name], DOB [birth year], MRN [id number] PCP: [name], MD  [facility] Providers Cardiologist:  Shelda Bruckner, MD {  History of Present Illness: Joel   [name] is a 89 y.o. male with Pmh hypertension, hyperlipidemia, statin intolerance, atypical chest pain, PVCs. He was previously followed by Dr. Maranda and Dr. Hobart and established care with me on 04/25/23.  Today: Overall doing well. Has some mild low energy but not limiting. Walks, plays golf regularly. Blood pressure well controlled.  Was in the ER in July 2024 for chest pain. Lab workup unremarkable. He tells me that it happened overnight, felt that his heart rate was rapid/skipping. Has not had any similar episodes since.  Was on ezetimibe  in the past. Took this for about 4 years. He ran out of this a few months. Discussed today, see below.  Rare PVCs, single beat, not bothersome.  ROS: Denies recent chest pain, shortness of breath at rest or with normal exertion. No PND, orthopnea, LE edema or unexpected weight gain. No syncope. ROS otherwise negative except as noted.   Studies Reviewed: Joel    EKG:       Physical Exam:   VS:  BP 128/62   Pulse 64   Ht 5' 11 (1.803 m)   Wt 182 lb 8 oz (82.8 kg)   SpO2 98%   BMI 25.45 kg/m    Wt Readings from Last 3 Encounters:  04/25/23 182 lb 8 oz (82.8 kg)  04/17/23 183 lb 6.4 oz (83.2 kg)  10/17/22 181 lb (82.1 kg)    GEN: Well nourished, well developed in no acute distress HEENT: Normal, moist mucous membranes NECK: No JVD CARDIAC: regular rhythm, normal S1 and S2, no rubs or gallops. No murmur. VASCULAR: Radial and DP pulses 2+ bilaterally. No carotid bruits RESPIRATORY:  Clear to auscultation without rales, wheezing or rhonchi  ABDOMEN: Soft, non-tender, non-distended MUSCULOSKELETAL:  Ambulates independently SKIN: Warm and dry, no edema NEUROLOGIC:  Alert and oriented x 3. No focal  neuro deficits noted. PSYCHIATRIC:  Normal affect    ASSESSMENT AND PLAN: .    Hypertension -at goal today, continue amlodipine , metoprolol , triamterene -hctz  Hyperlipidemia Statin intolerance -we discussed today. No known history of CVA or MI. Was on ezetimibe  for several years but have been off for months. Lipids recently show LDL 129 -fasting TG 341; no history of pancreatitis -after shared decision making, will not restart zetia   Mild AR -asymptomatic -repeat echo PRN  PACs/PVCs -feels occasionally, nonlimiting  CV risk counseling and prevention -recommend heart healthy/Mediterranean diet, with whole grains, fruits, vegetable, fish, lean meats, nuts, and olive oil. Limit salt. -recommend moderate walking, 3-5 times/week for 30-50 minutes each session. Aim for at least 150 minutes.week. Goal should be pace of 3 miles/hours, or walking 1.5 miles in 30 minutes -recommend avoidance of tobacco products. Avoid excess alcohol .  Dispo: 1 year or sooner as needed  Signed, Shelda Bruckner, MD   Shelda Bruckner, MD, PhD, [facility]  [facility]  [place]  [facility] at [facility] [address], [address] [place], [place] [address] [phone]

## 2023-05-17 ENCOUNTER — Ambulatory Visit: Payer: Medicare Other | Admitting: *Deleted

## 2023-05-17 DIAGNOSIS — Z0000 Encounter for general adult medical examination without abnormal findings: Secondary | ICD-10-CM | POA: Diagnosis not present

## 2023-05-17 NOTE — Patient Instructions (Signed)
 Joel Bauer , Thank you for taking time to come for your Medicare Wellness Visit. I appreciate your ongoing commitment to your health goals. Please review the following plan we discussed and let me know if I can assist you in the future.   Screening recommendations/referrals: Colonoscopy: no longer required Recommended yearly ophthalmology/optometry visit for glaucoma screening and checkup Recommended yearly dental visit for hygiene and checkup  Vaccinations: Influenza vaccine: up to date Pneumococcal vaccine: up to date Tdap vaccine: Education provided Shingles vaccine: up to date    Advanced directives: yes    Preventive Care 65 Years and Older, Male Preventive care refers to lifestyle choices and visits with your health care provider that can promote health and wellness. What does preventive care include? A yearly physical exam. This is also called an annual well check. Dental exams once or twice a year. Routine eye exams. Ask your health care provider how often you should have your eyes checked. Personal lifestyle choices, including: Daily care of your teeth and gums. Regular physical activity. Eating a healthy diet. Avoiding tobacco and drug use. Limiting alcohol use. Practicing safe sex. Taking low doses of aspirin every day. Taking vitamin and mineral supplements as recommended by your health care provider. What happens during an annual well check? The services and screenings done by your health care provider during your annual well check will depend on your age, overall health, lifestyle risk factors, and family history of disease. Counseling  Your health care provider may ask you questions about your: Alcohol use. Tobacco use. Drug use. Emotional well-being. Home and relationship well-being. Sexual activity. Eating habits. History of falls. Memory and ability to understand (cognition). Work and work Astronomer. Screening  You may have the following tests or  measurements: Height, weight, and BMI. Blood pressure. Lipid and cholesterol levels. These may be checked every 5 years, or more frequently if you are over 31 years old. Skin check. Lung cancer screening. You may have this screening every year starting at age 57 if you have a 30-pack-year history of smoking and currently smoke or have quit within the past 15 years. Fecal occult blood test (FOBT) of the stool. You may have this test every year starting at age 22. Flexible sigmoidoscopy or colonoscopy. You may have a sigmoidoscopy every 5 years or a colonoscopy every 10 years starting at age 28. Prostate cancer screening. Recommendations will vary depending on your family history and other risks. Hepatitis C blood test. Hepatitis B blood test. Sexually transmitted disease (STD) testing. Diabetes screening. This is done by checking your blood sugar (glucose) after you have not eaten for a while (fasting). You may have this done every 1-3 years. Abdominal aortic aneurysm (AAA) screening. You may need this if you are a current or former smoker. Osteoporosis. You may be screened starting at age 29 if you are at high risk. Talk with your health care provider about your test results, treatment options, and if necessary, the need for more tests. Vaccines  Your health care provider may recommend certain vaccines, such as: Influenza vaccine. This is recommended every year. Tetanus, diphtheria, and acellular pertussis (Tdap, Td) vaccine. You may need a Td booster every 10 years. Zoster vaccine. You may need this after age 54. Pneumococcal 13-valent conjugate (PCV13) vaccine. One dose is recommended after age 49. Pneumococcal polysaccharide (PPSV23) vaccine. One dose is recommended after age 33. Talk to your health care provider about which screenings and vaccines you need and how often you need them. This information  is not intended to replace advice given to you by your health care provider. Make sure  you discuss any questions you have with your health care provider. Document Released: 04/02/2015 Document Revised: 11/24/2015 Document Reviewed: 01/05/2015 Elsevier Interactive Patient Education  2017 [employer].  Fall Prevention in the Home Falls can cause injuries. They can happen to people of all ages. There are many things you can do to make your home safe and to help prevent falls. What can I do on the outside of my home? Regularly fix the edges of walkways and driveways and fix any cracks. Remove anything that might make you trip as you walk through a door, such as a raised step or threshold. Trim any bushes or trees on the path to your home. Use bright outdoor lighting. Clear any walking paths of anything that might make someone trip, such as rocks or tools. Regularly check to see if handrails are loose or broken. Make sure that both sides of any steps have handrails. Any raised decks and porches should have guardrails on the edges. Have any leaves, snow, or ice cleared regularly. Use sand or salt on walking paths during winter. Clean up any spills in your garage right away. This includes oil or grease spills. What can I do in the bathroom? Use night lights. Install grab bars by the toilet and in the tub and shower. Do not use towel bars as grab bars. Use non-skid mats or decals in the tub or shower. If you need to sit down in the shower, use a plastic, non-slip stool. Keep the floor dry. Clean up any water that spills on the floor as soon as it happens. Remove soap buildup in the tub or shower regularly. Attach bath mats securely with double-sided non-slip rug tape. Do not have throw rugs and other things on the floor that can make you trip. What can I do in the bedroom? Use night lights. Make sure that you have a light by your bed that is easy to reach. Do not use any sheets or blankets that are too big for your bed. They should not hang down onto the floor. Have a firm  chair that has side arms. You can use this for support while you get dressed. Do not have throw rugs and other things on the floor that can make you trip. What can I do in the kitchen? Clean up any spills right away. Avoid walking on wet floors. Keep items that you use a lot in easy-to-reach places. If you need to reach something above you, use a strong step stool that has a grab bar. Keep electrical cords out of the way. Do not use floor polish or wax that makes floors slippery. If you must use wax, use non-skid floor wax. Do not have throw rugs and other things on the floor that can make you trip. What can I do with my stairs? Do not leave any items on the stairs. Make sure that there are handrails on both sides of the stairs and use them. Fix handrails that are broken or loose. Make sure that handrails are as long as the stairways. Check any carpeting to make sure that it is firmly attached to the stairs. Fix any carpet that is loose or worn. Avoid having throw rugs at the top or bottom of the stairs. If you do have throw rugs, attach them to the floor with carpet tape. Make sure that you have a light switch at the top of  the stairs and the bottom of the stairs. If you do not have them, ask someone to add them for you. What else can I do to help prevent falls? Wear shoes that: Do not have high heels. Have rubber bottoms. Are comfortable and fit you well. Are closed at the toe. Do not wear sandals. If you use a stepladder: Make sure that it is fully opened. Do not climb a closed stepladder. Make sure that both sides of the stepladder are locked into place. Ask someone to hold it for you, if possible. Clearly mark and make sure that you can see: Any grab bars or handrails. First and last steps. Where the edge of each step is. Use tools that help you move around (mobility aids) if they are needed. These include: Canes. Walkers. Scooters. Crutches. Turn on the lights when you go  into a dark area. Replace any light bulbs as soon as they burn out. Set up your furniture so you have a clear path. Avoid moving your furniture around. If any of your floors are uneven, fix them. If there are any pets around you, be aware of where they are. Review your medicines with your doctor. Some medicines can make you feel dizzy. This can increase your chance of falling. Ask your doctor what other things that you can do to help prevent falls. This information is not intended to replace advice given to you by your health care provider. Make sure you discuss any questions you have with your health care provider. Document Released: 12/31/2008 Document Revised: 08/12/2015 Document Reviewed: 04/10/2014 Elsevier Interactive Patient Education  2017 [employer].

## 2023-05-17 NOTE — Progress Notes (Signed)
 Subjective:   Joel Bauer is a 89 y.o. male who presents for Medicare Annual/Subsequent preventive examination.  Visit Complete: Virtual I connected with  Esperanza Heir on 05/17/23 by a audio enabled telemedicine application and verified that I am speaking with the correct person using two identifiers.  Patient Location: Home  Provider Location: Home Office  I discussed the limitations of evaluation and management by telemedicine. The patient expressed understanding and agreed to proceed.  Vital Signs: Because this visit was a virtual/telehealth visit, some criteria may be missing or patient reported. Any vitals not documented were not able to be obtained and vitals that have been documented are patient reported.  Patient Medicare AWV questionnaire was completed by the patient on 05-11-2023; I have confirmed that all information answered by patient is correct and no changes since this date.  Cardiac Risk Factors include: advanced age (>59men, >101 women);male gender;hypertension     Objective:    There were no vitals filed for this visit. There is no height or weight on file to calculate BMI.     05/17/2023   10:27 AM 09/25/2022   12:36 PM 04/04/2022   12:02 PM 09/02/2020   10:36 AM 05/31/2016    6:46 AM 05/31/2016    2:17 AM 05/29/2016    4:35 PM  Advanced Directives  Does Patient Have a Medical Advance Directive? Yes No Yes Yes Yes Yes No  Type of Advance Directive Living will  Living will;Healthcare Power of [employer] Power of [place];Living will Healthcare Power of [place];Living will   Does patient want to make changes to medical advance directive?   No - Patient declined No - Patient declined No - Patient declined    Copy of Healthcare Power of Attorney in Chart?   No - copy requested  No - copy requested No - copy requested   Would patient like information on creating a medical advance directive?  No - Patient declined  No - Patient declined No - Patient  declined No - Patient declined     Current Medications (verified) Outpatient Encounter Medications as of 05/17/2023  Medication Sig   amLODipine (NORVASC) 10 MG tablet Take 1 tablet (10 mg total) by mouth daily.   fluticasone (FLONASE) 50 MCG/ACT nasal spray Place 1 spray into both nostrils daily as needed for allergies or rhinitis.   hydroxypropyl methylcellulose / hypromellose (ISOPTO TEARS / GONIOVISC) 2.5 % ophthalmic solution Place 1 drop into both eyes 3 (three) times daily as needed for dry eyes.   metoprolol succinate (TOPROL-XL) 25 MG 24 hr tablet TAKE 1 TABLET BY MOUTH EVERY DAY   mometasone (ELOCON) 0.1 % cream Apply topically in the morning and at bedtime.   multivitamin-lutein (OCUVITE-LUTEIN) CAPS capsule Take 1 capsule by mouth daily.   omeprazole (PRILOSEC) 40 MG capsule Take 1 capsule (40 mg total) by mouth daily.   potassium chloride (KLOR-CON M) 10 MEQ tablet TAKE 1 TABLET(10 MEQ) BY MOUTH TWICE DAILY   primidone (MYSOLINE) 50 MG tablet Take 1 tablet (50 mg total) by mouth 3 (three) times daily.   triamterene-hydrochlorothiazide (DYAZIDE) 37.5-25 MG capsule TAKE 1 CAPSULE BY MOUTH EVERY DAY   No facility-administered encounter medications on file as of 05/17/2023.    Allergies (verified) Rosuvastatin   History: Past Medical History:  Diagnosis Date   Adenomatous colon polyp 2001   Anemia    as a child    Aortic regurgitation    a. mild by echo 03/2015   Arthritis  Cancer (HCC)    hx of skin cancer    Chest pain 03/31/2015   Diverticulitis large intestine 04/19/2015   Diverticulitis of colon - recurrent/persistent 12/29/2014   Diverticulosis of colon (without mention of hemorrhage) 2005   GERD (gastroesophageal reflux disease)    History of kidney stones    History of skin cancer    Hyperlipidemia    Hypertension    Hypertensive heart disease    LLQ pain 11/26/2014   Macular degeneration    left eye   Orthostasis    Pleurisy    Premature atrial  contractions    Prostatitis    PVC's (premature ventricular contractions)    Recurrent colitis due to Clostridium difficile    Retinal hemorrhage    SCC (squamous cell carcinoma) 11/13/2018   RIGHT FOREARM CX3 5FU   Shingles    Small bowel obstruction (HCC) 05/31/2016   Squamous cell carcinoma of skin 07/23/2017   SCC IN SITU LEFT UPPER NOSE BRIDGE CX3 5FU CAUTERY   Past Surgical History:  Procedure Laterality Date   CATARACTS REMOVED     COLONOSCOPY     COLONOSCOPY N/A 06/15/2014   Procedure: COLONOSCOPY;  Surgeon: Iva Boop, MD;  Location: [facility] ENDOSCOPY;  Service: Endoscopy;  Laterality: N/A;   CYSTOSCOPY WITH RETROGRADE PYELOGRAM, URETEROSCOPY AND STENT PLACEMENT Right 10/18/2013   Procedure: CYSTOSCOPY WITH RIGHT RETROGRADE/RIGHT URETEROSCOPY, STONE BASKETRY,  AND STENT PLACEMENT RIGHT;  Surgeon: Sebastian Ache, MD;  Location: [facility] ORS;  Service: Urology;  Laterality: Right;   EYE SURGERY     3 injections to left eye for macular degeneration    FMT  06/2014   LAPAROSCOPIC LOW ANTERIOR RESECTION N/A 04/19/2015   Procedure: LAPAROSCOPIC ASSISTED  LOW ANTERIOR RESECTION, splenic flexure mobilization;  Surgeon: Claud Kelp, MD;  Location: [facility];  Service: General;  Laterality: N/A;   LITHOTRIPSY     TONSILLECTOMY     Family History  Problem Relation Age of Onset   Breast cancer Mother    Heart disease Mother    Colon cancer Father    Colon polyps Father    Kidney disease Sister    Social History   Socioeconomic History   Marital status: Widowed    Spouse name: Not on file   Number of children: 1   Years of education: Not on file   Highest education level: Not on file  Occupational History   Occupation: Retired   Tobacco Use   Smoking status: Never   Smokeless tobacco: Never  Vaping Use   Vaping status: Never Used  Substance and Sexual Activity   Alcohol use: No    Alcohol/week: 0.0 standard drinks of alcohol   Drug use: No   Sexual activity: Not Currently   Other Topics Concern   Not on file  Social History Narrative   He is retired, widowed and has 1 child.   He does not use alcohol tobacco or drugs   Social Drivers of Corporate investment banker Strain: Low Risk  (05/17/2023)   Overall Financial Resource Strain (CARDIA)    Difficulty of Paying Living Expenses: Not hard at all  Food Insecurity: No Food Insecurity (05/17/2023)   Hunger Vital Sign    Worried About Running Out of Food in the Last Year: Never true    Ran Out of Food in the Last Year: Never true  Transportation Needs: No Transportation Needs (05/17/2023)   PRAPARE - Administrator, Civil Service (Medical): No  Lack of Transportation (Non-Medical): No  Physical Activity: Insufficiently Active (05/17/2023)   Exercise Vital Sign    Days of Exercise per Week: 4 days    Minutes of Exercise per Session: 30 min  Stress: No Stress Concern Present (05/17/2023)   [employer] of Occupational Health - Occupational Stress Questionnaire    Feeling of Stress : Not at all  Social Connections: Moderately Isolated (05/17/2023)   Social Connection and Isolation Panel [NHANES]    Frequency of Communication with Friends and Family: More than three times a week    Frequency of Social Gatherings with Friends and Family: Twice a week    Attends Religious Services: 1 to 4 times per year    Active Member of [employer] or Organizations: No    Attends Banker Meetings: Never    Marital Status: Widowed    Tobacco Counseling Counseling given: Not Answered   Clinical Intake:  Pre-visit preparation completed: Yes  Pain : No/denies pain     Diabetes: No  How often do you need to have someone help you when you read instructions, pamphlets, or other written materials from your doctor or pharmacy?: 1 - Never  Interpreter Needed?: No  Information entered by :: Remi Haggard LPN   Activities of Daily Living    05/17/2023   10:24 AM 05/11/2023    4:50 PM  In your  present state of health, do you have any difficulty performing the following activities:  Hearing? 1 1  Vision? 0 0  Difficulty concentrating or making decisions? 0 0  Walking or climbing stairs? 0 0  Dressing or bathing? 0 0  Doing errands, shopping? 0 0  Preparing Food and eating ? N N  Using the Toilet? N N  In the past six months, have you accidently leaked urine? N N  Do you have problems with loss of bowel control? N N  Managing your Medications? N N  Managing your Finances? N N  Housekeeping or managing your Housekeeping? N N    Patient Care Team: Donita Brooks, MD as PCP - General (Family Medicine) Jodelle Red, MD as PCP - Cardiology (Cardiology)  Indicate any recent Medical Services you may have received from other than [facility] providers in the past year (date may be approximate).     Assessment:   This is a routine wellness examination for Danyl.  Hearing/Vision screen Hearing Screening - Comments:: Bilateral hearing aids Vision Screening - Comments:: Up to date Macular degeneration in Left Eye    -- no change no longer has to get injections  [facility]   Goals Addressed             This Visit's Progress    DIET - INCREASE WATER INTAKE   On track    Patient Stated       Making decision on staying or going       Depression Screen    05/17/2023   10:25 AM 04/17/2023    9:18 AM 07/21/2022    3:46 PM 04/04/2022   12:01 PM 09/02/2020   10:34 AM 12/26/2016    4:09 PM 05/14/2014   10:44 AM  PHQ 2/9 Scores  PHQ - 2 Score 2 0 0 0 0 0 0  PHQ- 9 Score 4          Fall Risk    05/17/2023   10:21 AM 05/11/2023    4:50 PM 04/17/2023    8:50 AM 07/21/2022    3:46 PM 04/04/2022  11:55 AM  Fall Risk   Falls in the past year? 0 0 0 0 0  Number falls in past yr: 0 0 0 0 0  Injury with Fall? 0  0 0 0  Risk for fall due to :   No Fall Risks No Fall Risks No Fall Risks  Follow up Falls evaluation completed;Education provided;Falls prevention discussed  Falls  prevention discussed Falls prevention discussed Falls evaluation completed;Education provided;Falls prevention discussed    MEDICARE RISK AT HOME: Medicare Risk at Home Any stairs in or around the home?: No Home free of loose throw rugs in walkways, pet beds, electrical cords, etc?: No Adequate lighting in your home to reduce risk of falls?: Yes Life alert?: No Use of a cane, walker or w/c?: No Grab bars in the bathroom?: No Shower chair or bench in shower?: No Elevated toilet seat or a handicapped toilet?: No  TIMED UP AND GO:  Was the test performed?  No    Cognitive Function:        05/17/2023   10:21 AM 04/04/2022   12:02 PM  6CIT Screen  What Year? 0 points 0 points  What month? 0 points 0 points  What time? 0 points 0 points  Count back from 20 0 points 0 points  Months in reverse 0 points 0 points  Repeat phrase 0 points 0 points  Total Score 0 points 0 points    Immunizations Immunization History  Administered Date(s) Administered   Influenza, High Dose Seasonal PF 12/11/2018, 12/11/2018, 03/12/2023   Influenza,inj,Quad PF,6+ Mos 12/26/2016, 12/31/2017   Influenza-Unspecified 03/06/2007, 12/27/2007, 11/18/2012, 04/03/2016   PFIZER(Purple Top)SARS-COV-2 Vaccination 04/10/2019, 05/01/2019, 01/27/2020   Pneumococcal Conjugate-13 03/27/2019   Pneumococcal Polysaccharide-23 07/28/2010   Td 11/10/2003   Zoster Recombinant(Shingrix) 07/21/2022, 09/28/2022    TDAP status: Due, Education has been provided regarding the importance of this vaccine. Advised may receive this vaccine at local pharmacy or Health Dept. Aware to provide a copy of the vaccination record if obtained from local pharmacy or Health Dept. Verbalized acceptance and understanding.  Flu Vaccine status: Up to date  Pneumococcal vaccine status: Up to date  Covid-19 vaccine status: Information provided on how to obtain vaccines.   Qualifies for Shingles Vaccine? No   Zostavax completed Yes    Shingrix Completed?: Yes  Screening Tests Health Maintenance  Topic Date Due   COVID-19 Vaccine (4 - 2024-25 season) 06/15/2023 (Originally 11/19/2022)   Medicare Annual Wellness (AWV)  05/16/2024   Pneumonia Vaccine 65+ Years old  Completed   INFLUENZA VACCINE  Completed   Zoster Vaccines- Shingrix  Completed   HPV VACCINES  Aged Out   DTaP/Tdap/Td  Discontinued    Health Maintenance  There are no preventive care reminders to display for this patient.  Colorectal cancer screening: No longer required.   Lung Cancer Screening: (Low Dose CT Chest recommended if Age 37-80 years, 20 pack-year currently smoking OR have quit w/in 15years.) does not qualify.   Lung Cancer Screening Referral:   Additional Screening:  Hepatitis C Screening: does not qualify;   Vision Screening: Recommended annual ophthalmology exams for early detection of glaucoma and other disorders of the eye. Is the patient up to date with their annual eye exam?  Yes  Who is the provider or what is the name of the office in which the patient attends annual eye exams? [facility] If pt is not established with a provider, would they like to be referred to a provider to establish care? No .  Dental Screening: Recommended annual dental exams for proper oral hygiene    Community Resource Referral / Chronic Care Management: CRR required this visit?  No   CCM required this visit?  No     Plan:     I have personally reviewed and noted the following in the patient's chart:   Medical and social history Use of alcohol, tobacco or illicit drugs  Current medications and supplements including opioid prescriptions. Patient is not currently taking opioid prescriptions. Functional ability and status Nutritional status Physical activity Advanced directives List of other physicians Hospitalizations, surgeries, and ER visits in previous 12 months Vitals Screenings to include cognitive, depression, and falls Referrals and  appointments  In addition, I have reviewed and discussed with patient certain preventive protocols, quality metrics, and best practice recommendations. A written personalized care plan for preventive services as well as general preventive health recommendations were provided to patient.     Remi Haggard, LPN   1/61/0960   After Visit Summary: (MyChart) Due to this being a telephonic visit, the after visit summary with patients personalized plan was offered to patient via MyChart   Nurse Notes:

## 2023-05-28 ENCOUNTER — Other Ambulatory Visit: Payer: Self-pay

## 2023-05-28 MED ORDER — TRIAMTERENE-HCTZ 37.5-25 MG PO CAPS: ORAL_CAPSULE | ORAL | 3 refills | Status: AC

## 2023-06-01 DIAGNOSIS — H353132 Nonexudative age-related macular degeneration, bilateral, intermediate dry stage: Secondary | ICD-10-CM | POA: Diagnosis not present

## 2023-06-01 DIAGNOSIS — Z961 Presence of intraocular lens: Secondary | ICD-10-CM | POA: Diagnosis not present

## 2023-06-14 ENCOUNTER — Ambulatory Visit (INDEPENDENT_AMBULATORY_CARE_PROVIDER_SITE_OTHER): Admitting: Family Medicine

## 2023-06-14 ENCOUNTER — Encounter: Payer: Self-pay | Admitting: Family Medicine

## 2023-06-14 VITALS — BP 110/70 | HR 64 | Temp 97.7°F | Ht 71.0 in | Wt 177.0 lb

## 2023-06-14 DIAGNOSIS — S76211A Strain of adductor muscle, fascia and tendon of right thigh, initial encounter: Secondary | ICD-10-CM | POA: Diagnosis not present

## 2023-06-14 NOTE — Progress Notes (Signed)
 Subjective:    Patient ID: Joel Bauer, male    DOB: 08-Sep-1933, 89 y.o.   MRN: [id number]  Patient is a very pleasant 89 year old Caucasian gentleman who was moving furniture recently.  He strained a muscle in his right anterior hip.  Resisted hip flexion elicits pain.  The patient has no pain with passive flexion of the hip, internal rotation of the hip, external rotation of the hip.  There is no inguinal or femoral hernia appreciated on exam.  He does have some mild tenderness to palpation in the inguinal canal.  However resisted hip flexion elicits pain. Past Medical History:  Diagnosis Date   Adenomatous colon polyp 2001   Anemia    as a child    Aortic regurgitation    a. mild by echo 03/2015   Arthritis    Cancer (HCC)    hx of skin cancer    Chest pain 03/31/2015   Diverticulitis large intestine 04/19/2015   Diverticulitis of colon - recurrent/persistent 12/29/2014   Diverticulosis of colon (without mention of hemorrhage) 2005   GERD (gastroesophageal reflux disease)    History of kidney stones    History of skin cancer    Hyperlipidemia    Hypertension    Hypertensive heart disease    LLQ pain 11/26/2014   Macular degeneration    left eye   Orthostasis    Pleurisy    Premature atrial contractions    Prostatitis    PVC's (premature ventricular contractions)    Recurrent colitis due to Clostridium difficile    Retinal hemorrhage    SCC (squamous cell carcinoma) 11/13/2018   RIGHT FOREARM CX3 5FU   Shingles    Small bowel obstruction (HCC) 05/31/2016   Squamous cell carcinoma of skin 07/23/2017   SCC IN SITU LEFT UPPER NOSE BRIDGE CX3 5FU CAUTERY   Past Surgical History:  Procedure Laterality Date   CATARACTS REMOVED     COLONOSCOPY     COLONOSCOPY N/A 06/15/2014   Procedure: COLONOSCOPY;  Surgeon: Iva Boop, MD;  Location[facility] ENDOSCOPY;  Service: Endoscopy;  Laterality: N/A;   CYSTOSCOPY WITH RETROGRADE PYELOGRAM, URETEROSCOPY AND STENT PLACEMENT  Right 10/18/2013   Procedure: CYSTOSCOPY WITH RIGHT RETROGRADE/RIGHT URETEROSCOPY, STONE BASKETRY,  AND STENT PLACEMENT RIGHT;  Surgeon: Sebastian Ache, MD;  Location: [facility];  Service: Urology;  Laterality: Right;   EYE SURGERY     3 injections to left eye for macular degeneration    FMT  06/2014   LAPAROSCOPIC LOW ANTERIOR RESECTION N/A 04/19/2015   Procedure: LAPAROSCOPIC ASSISTED  LOW ANTERIOR RESECTION, splenic flexure mobilization;  Surgeon: Claud Kelp, MD;  Location: [facility] ORS;  Service: General;  Laterality: N/A;   LITHOTRIPSY     TONSILLECTOMY     Current Outpatient Medications on File Prior to Visit  Medication Sig Dispense Refill   amLODipine (NORVASC) 10 MG tablet Take 1 tablet (10 mg total) by mouth daily. 90 tablet 3   fluticasone (FLONASE) 50 MCG/ACT nasal spray Place 1 spray into both nostrils daily as needed for allergies or rhinitis.     hydroxypropyl methylcellulose / hypromellose (ISOPTO TEARS / GONIOVISC) 2.5 % ophthalmic solution Place 1 drop into both eyes 3 (three) times daily as needed for dry eyes.     metoprolol succinate (TOPROL-XL) 25 MG 24 hr tablet TAKE 1 TABLET BY MOUTH EVERY DAY 90 tablet 0   multivitamin-lutein (OCUVITE-LUTEIN) CAPS capsule Take 1 capsule by mouth daily.  omeprazole (PRILOSEC) 40 MG capsule Take 1 capsule (40 mg total) by mouth daily. 90 capsule 3   primidone (MYSOLINE) 50 MG tablet Take 1 tablet (50 mg total) by mouth 3 (three) times daily. 270 tablet 3   triamterene-hydrochlorothiazide (DYAZIDE) 37.5-25 MG capsule TAKE 1 CAPSULE BY MOUTH EVERY DAY 90 capsule 3   mometasone (ELOCON) 0.1 % cream Apply topically in the morning and at bedtime. (Patient not taking: Reported on 06/14/2023) 15 g 2   potassium chloride (KLOR-CON M) 10 MEQ tablet TAKE 1 TABLET(10 MEQ) BY MOUTH TWICE DAILY (Patient not taking: Reported on 06/14/2023) 180 tablet 0   No current facility-administered medications on file prior to visit.   Allergies  Allergen Reactions    Rosuvastatin Other (See Comments)    Pt reports causes muscle cramps in legs   Social History   Socioeconomic History   Marital status: Widowed    Spouse name: Not on file   Number of children: 1   Years of education: Not on file   Highest education level: Not on file  Occupational History   Occupation: Retired   Tobacco Use   Smoking status: Never   Smokeless tobacco: Never  Vaping Use   Vaping status: Never Used  Substance and Sexual Activity   Alcohol use: No    Alcohol/week: 0.0 standard drinks of alcohol   Drug use: No   Sexual activity: Not Currently  Other Topics Concern   Not on file  Social History Narrative   He is retired, widowed and has 1 child.   He does not use alcohol tobacco or drugs   Social Drivers of Corporate investment banker Strain: Low Risk  (05/17/2023)   Overall Financial Resource Strain (CARDIA)    Difficulty of Paying Living Expenses: Not hard at all  Food Insecurity: No Food Insecurity (05/17/2023)   Hunger Vital Sign    Worried About Running Out of Food in the Last Year: Never true    Ran Out of Food in the Last Year: Never true  Transportation Needs: No Transportation Needs (05/17/2023)   PRAPARE - Administrator, Civil Service (Medical): No    Lack of Transportation (Non-Medical): No  Physical Activity: Insufficiently Active (05/17/2023)   Exercise Vital Sign    Days of Exercise per Week: 4 days    Minutes of Exercise per Session: 30 min  Stress: No Stress Concern Present (05/17/2023)   [employer] of Occupational Health - Occupational Stress Questionnaire    Feeling of Stress : Not at all  Social Connections: Moderately Isolated (05/17/2023)   Social Connection and Isolation Panel [NHANES]    Frequency of Communication with Friends and Family: More than three times a week    Frequency of Social Gatherings with Friends and Family: Twice a week    Attends Religious Services: 1 to 4 times per year    Active Member of [employer]  or Organizations: No    Attends Banker Meetings: Never    Marital Status: Widowed  Intimate Partner Violence: Not At Risk (05/17/2023)   Humiliation, Afraid, Rape, and Kick questionnaire    Fear of Current or Ex-Partner: No    Emotionally Abused: No    Physically Abused: No    Sexually Abused: No      Review of Systems  All other systems reviewed and are negative.      Objective:   Physical Exam Vitals reviewed.  Constitutional:      General: He is  not in acute distress.    Appearance: Normal appearance. He is well-developed and normal weight. He is not ill-appearing, toxic-appearing or diaphoretic.  HENT:     Head:   Neck:     Vascular: No carotid bruit or JVD.  Cardiovascular:     Rate and Rhythm: Normal rate and regular rhythm.     Heart sounds: Murmur heard.     No friction rub. No gallop.  Pulmonary:     Effort: Pulmonary effort is normal. No respiratory distress.     Breath sounds: Normal breath sounds. No stridor. No wheezing, rhonchi or rales.  Chest:     Chest wall: No tenderness.  Musculoskeletal:     Cervical back: Neck supple. No rigidity or tenderness.     Right hip: Tenderness present. No bony tenderness. Normal range of motion. Decreased strength.       Legs:  Lymphadenopathy:     Cervical: No cervical adenopathy.  Neurological:     Mental Status: He is alert.           Assessment & Plan:  Inguinal strain, right, initial encounter Believe the patient struck his right hip flexor.  However there is no bruising.  The patient is still able to flex his right hip against resistance.  Therefore I recommended tincture of time.  Anticipate gradual improvement over the next 4 to 6 weeks.  Recommended stretches to help with flexibility and range of motion.

## 2023-07-03 ENCOUNTER — Other Ambulatory Visit: Payer: Self-pay | Admitting: Family Medicine

## 2023-07-03 DIAGNOSIS — R002 Palpitations: Secondary | ICD-10-CM

## 2023-07-03 NOTE — Telephone Encounter (Signed)
 Requested Prescriptions  Pending Prescriptions Disp Refills   metoprolol succinate (TOPROL-XL) 25 MG 24 hr tablet [Pharmacy Med Name: METOPROLOL ER SUCCINATE 25MG  TABS] 90 tablet 0    Sig: TAKE 1 TABLET BY MOUTH EVERY DAY     Cardiovascular:  Beta Blockers Passed - 07/03/2023  5:58 PM      Passed - Last BP in normal range    BP Readings from Last 1 Encounters:  06/14/23 110/70         Passed - Last Heart Rate in normal range    Pulse Readings from Last 1 Encounters:  06/14/23 64         Passed - Valid encounter within last 6 months    Recent Outpatient Visits           2 weeks ago Inguinal strain, right, initial encounter   [place] [facility] Austine Lefort, MD   2 months ago Benign essential HTN   [place] [facility] Austine Lefort, MD   8 months ago Benign essential HTN   [place] [facility] Austine Lefort, MD   11 months ago Need for zoster vaccination   [place] [facility] [name], Cisco Crest, MD

## 2023-07-09 ENCOUNTER — Other Ambulatory Visit: Payer: Self-pay | Admitting: Family Medicine

## 2023-07-09 DIAGNOSIS — R002 Palpitations: Secondary | ICD-10-CM

## 2023-07-10 NOTE — Telephone Encounter (Signed)
 Requested Prescriptions  Refused Prescriptions Disp Refills   metoprolol  succinate (TOPROL -XL) 25 MG 24 hr tablet [Pharmacy Med Name: METOPROLOL  ER SUCCINATE 25MG  TABS] 90 tablet 0    Sig: TAKE 1 TABLET BY MOUTH EVERY DAY     Cardiovascular:  Beta Blockers Passed - 07/10/2023 11:04 AM      Passed - Last BP in normal range    BP Readings from Last 1 Encounters:  06/14/23 110/70         Passed - Last Heart Rate in normal range    Pulse Readings from Last 1 Encounters:  06/14/23 64         Passed - Valid encounter within last 6 months    Recent Outpatient Visits           3 weeks ago Inguinal strain, right, initial encounter   [place] [facility] Austine Lefort, MD   2 months ago Benign essential HTN   [place] [facility] Austine Lefort, MD   8 months ago Benign essential HTN   [place] [facility] Cheril Cork, Cisco Crest, MD   11 months ago Need for zoster vaccination    [facility] [name], Cisco Crest, MD

## 2023-07-19 ENCOUNTER — Ambulatory Visit: Admitting: Family Medicine

## 2023-07-25 DIAGNOSIS — L538 Other specified erythematous conditions: Secondary | ICD-10-CM | POA: Diagnosis not present

## 2023-07-25 DIAGNOSIS — L820 Inflamed seborrheic keratosis: Secondary | ICD-10-CM | POA: Diagnosis not present

## 2023-07-25 DIAGNOSIS — D225 Melanocytic nevi of trunk: Secondary | ICD-10-CM | POA: Diagnosis not present

## 2023-07-25 DIAGNOSIS — L814 Other melanin hyperpigmentation: Secondary | ICD-10-CM | POA: Diagnosis not present

## 2023-07-25 DIAGNOSIS — L570 Actinic keratosis: Secondary | ICD-10-CM | POA: Diagnosis not present

## 2023-07-25 DIAGNOSIS — L821 Other seborrheic keratosis: Secondary | ICD-10-CM | POA: Diagnosis not present

## 2023-07-25 DIAGNOSIS — D229 Melanocytic nevi, unspecified: Secondary | ICD-10-CM | POA: Diagnosis not present

## 2023-08-23 ENCOUNTER — Encounter: Payer: Self-pay | Admitting: Family Medicine

## 2023-08-23 ENCOUNTER — Ambulatory Visit: Admitting: Family Medicine

## 2023-08-23 VITALS — BP 120/62 | HR 60 | Temp 97.5°F | Ht 71.0 in | Wt 175.4 lb

## 2023-08-23 DIAGNOSIS — S76211D Strain of adductor muscle, fascia and tendon of right thigh, subsequent encounter: Secondary | ICD-10-CM

## 2023-08-23 NOTE — Progress Notes (Signed)
 Subjective:    Patient ID: Joel Bauer, male    DOB: 17-Jul-1933, 89 y.o.   MRN: [id number] 06/14/23 Patient is a very pleasant 89 year old Caucasian gentleman who was moving furniture recently.  He strained a muscle in his right anterior hip.  Resisted hip flexion elicits pain.  The patient has no pain with passive flexion of the hip, internal rotation of the hip, external rotation of the hip.  There is no inguinal or femoral hernia appreciated on exam.  He does have some mild tenderness to palpation in the inguinal canal.  However resisted hip flexion elicits pain.  At that time, my plan was: Believe the patient strained his right hip flexor.  However there is no bruising.  The patient is still able to flex his right hip against resistance.  Therefore I recommended tincture of time.  Anticipate gradual improvement over the next 4 to 6 weeks.  Recommended stretches to help with flexibility and range of motion.  08/23/23 Patient continues to have significant pain in the anterior portion of his right hip with resisted hip flexion.  I am passively able to flex his hip to his chest.  I am passively able to internally and externally rotate the hip without eliciting any pain.  However when I asked the patient to raise his leg off the exam table against resistance and flex his hip against resistance, the patient reports significant pain in the inguinal canal area.  The patient does appear to have a small inguinal hernia today on exam that is present only when he coughs.  I do not believe that this is the source of his pain. Past Medical History:  Diagnosis Date   Adenomatous colon polyp 2001   Anemia    as a child    Aortic regurgitation    a. mild by echo 03/2015   Arthritis    Cancer (HCC)    hx of skin cancer    Chest pain 03/31/2015   Diverticulitis large intestine 04/19/2015   Diverticulitis of colon - recurrent/persistent 12/29/2014   Diverticulosis of colon (without mention of hemorrhage)  2005   GERD (gastroesophageal reflux disease)    History of kidney stones    History of skin cancer    Hyperlipidemia    Hypertension    Hypertensive heart disease    LLQ pain 11/26/2014   Macular degeneration    left eye   Orthostasis    Pleurisy    Premature atrial contractions    Prostatitis    PVC's (premature ventricular contractions)    Recurrent colitis due to Clostridium difficile    Retinal hemorrhage    SCC (squamous cell carcinoma) 11/13/2018   RIGHT FOREARM CX3 5FU   Shingles    Small bowel obstruction (HCC) 05/31/2016   Squamous cell carcinoma of skin 07/23/2017   SCC IN SITU LEFT UPPER NOSE BRIDGE CX3 5FU CAUTERY   Past Surgical History:  Procedure Laterality Date   CATARACTS REMOVED     COLONOSCOPY     COLONOSCOPY N/A 06/15/2014   Procedure: COLONOSCOPY;  Surgeon: Kenney Peacemaker, MD;  Location: [facility] ENDOSCOPY;  Service: Endoscopy;  Laterality: N/A;   CYSTOSCOPY WITH RETROGRADE PYELOGRAM, URETEROSCOPY AND STENT PLACEMENT Right 10/18/2013   Procedure: CYSTOSCOPY WITH RIGHT RETROGRADE/RIGHT URETEROSCOPY, STONE BASKETRY,  AND STENT PLACEMENT RIGHT;  Surgeon: Osborn Blaze, MD;  Location: [facility] ORS;  Service: Urology;  Laterality: Right;   EYE SURGERY     3 injections to left eye for macular degeneration  FMT  06/2014   LAPAROSCOPIC LOW ANTERIOR RESECTION N/A 04/19/2015   Procedure: LAPAROSCOPIC ASSISTED  LOW ANTERIOR RESECTION, splenic flexure mobilization;  Surgeon: Boyce Byes, MD;  Location: [facility] OR[facility];  Service: General;  Laterality: N/A;   LITHOTRIPSY     TONSILLECTOMY     Current Outpatient Medications on File Prior to Visit  Medication Sig Dispense Refill   amLODipine  (NORVASC ) 10 MG tablet Take 1 tablet (10 mg total) by mouth daily. 90 tablet 3   fluticasone  (FLONASE ) 50 MCG/ACT nasal spray Place 1 spray into both nostrils daily as needed for allergies or rhinitis.     hydroxypropyl methylcellulose / hypromellose (ISOPTO TEARS / GONIOVISC) 2.5 % ophthalmic solution  Place 1 drop into both eyes 3 (three) times daily as needed for dry eyes.     metoprolol  succinate (TOPROL -XL) 25 MG 24 hr tablet TAKE 1 TABLET BY MOUTH EVERY DAY 90 tablet 0   multivitamin-lutein (OCUVITE-LUTEIN) CAPS capsule Take 1 capsule by mouth daily.     omeprazole  (PRILOSEC) 40 MG capsule Take 1 capsule (40 mg total) by mouth daily. 90 capsule 3   potassium chloride  (KLOR-CON  M) 10 MEQ tablet TAKE 1 TABLET(10 MEQ) BY MOUTH TWICE DAILY 180 tablet 0   primidone  (MYSOLINE ) 50 MG tablet Take 1 tablet (50 mg total) by mouth 3 (three) times daily. 270 tablet 3   triamterene -hydrochlorothiazide (DYAZIDE) 37.5-25 MG capsule TAKE 1 CAPSULE BY MOUTH EVERY DAY 90 capsule 3   mometasone  (ELOCON ) 0.1 % cream Apply topically in the morning and at bedtime. (Patient not taking: Reported on 08/23/2023) 15 g 2   No current facility-administered medications on file prior to visit.   Allergies  Allergen Reactions   Rosuvastatin  Other (See Comments)    Pt reports causes muscle cramps in legs   Social History   Socioeconomic History   Marital status: Widowed    Spouse name: Not on file   Number of children: 1   Years of education: Not on file   Highest education level: Not on file  Occupational History   Occupation: Retired   Tobacco Use   Smoking status: Never   Smokeless tobacco: Never  Vaping Use   Vaping status: Never Used  Substance and Sexual Activity   Alcohol  use: No    Alcohol /week: 0.0 standard drinks of alcohol    Drug use: No   Sexual activity: Not Currently  Other Topics Concern   Not on file  Social History Narrative   He is retired, widowed and has 1 child.   He does not use alcohol  tobacco or drugs   Social Drivers of Corporate investment banker Strain: Low Risk  (05/17/2023)   Overall Financial Resource Strain (CARDIA)    Difficulty of Paying Living Expenses: Not hard at all  Food Insecurity: No Food Insecurity (05/17/2023)   Hunger Vital Sign    Worried About Running  Out of Food in the Last Year: Never true    Ran Out of Food in the Last Year: Never true  Transportation Needs: No Transportation Needs (05/17/2023)   PRAPARE - Administrator, Civil Service (Medical): No    Lack of Transportation (Non-Medical): No  Physical Activity: Insufficiently Active (05/17/2023)   Exercise Vital Sign    Days of Exercise per Week: 4 days    Minutes of Exercise per Session: 30 min  Stress: No Stress Concern Present (05/17/2023)   [employer] of Occupational Health - Occupational Stress Questionnaire    Feeling of Stress :  Not at all  Social Connections: Moderately Isolated (05/17/2023)   Social Connection and Isolation Panel [NHANES]    Frequency of Communication with Friends and Family: More than three times a week    Frequency of Social Gatherings with Friends and Family: Twice a week    Attends Religious Services: 1 to 4 times per year    Active Member of [employer] or Organizations: No    Attends Banker Meetings: Never    Marital Status: Widowed  Intimate Partner Violence: Not At Risk (05/17/2023)   Humiliation, Afraid, Rape, and Kick questionnaire    Fear of Current or Ex-Partner: No    Emotionally Abused: No    Physically Abused: No    Sexually Abused: No      Review of Systems  All other systems reviewed and are negative.      Objective:   Physical Exam Vitals reviewed.  Constitutional:      General: He is not in acute distress.    Appearance: Normal appearance. He is well-developed and normal weight. He is not ill-appearing, toxic-appearing or diaphoretic.  Neck:     Vascular: No carotid bruit or JVD.  Cardiovascular:     Rate and Rhythm: Normal rate and regular rhythm.     Heart sounds: Murmur heard.     No friction rub. No gallop.  Pulmonary:     Effort: Pulmonary effort is normal. No respiratory distress.     Breath sounds: Normal breath sounds. No stridor. No wheezing, rhonchi or rales.  Chest:     Chest wall:  No tenderness.  Abdominal:     Hernia: A hernia is present. Hernia is present in the right inguinal area. There is no hernia in the left inguinal area.  Genitourinary:    Penis: Normal.      Testes: Normal.        Right: Testicular hydrocele or varicocele not present.        Left: Testicular hydrocele or varicocele not present.     Epididymis:     Right: Normal.     Left: Normal.  Musculoskeletal:     Cervical back: Neck supple. No rigidity or tenderness.     Right hip: Tenderness present. No bony tenderness. Normal range of motion. Decreased strength.       Legs:  Lymphadenopathy:     Cervical: No cervical adenopathy.     Lower Body: No right inguinal adenopathy. No left inguinal adenopathy.  Neurological:     Mental Status: He is alert.           Assessment & Plan:  Inguinal strain, right, subsequent encounter I believe the patient's pain is primarily due to the strain in his iliopsoas muscle and his hip flexor.  Recommended physical therapy.  He does have a small inguinal hernia but I believe this is asymptomatic.  Await to see how he responds to physical therapy.  Patient also reports occasional palpitations in his chest.  He describes them as a strong heartbeat.  This will occur occasionally and sporadically amongst regular heartbeats.  He denies syncope or near syncope.  I believe he is describing PVCs.  He has had these before.  Today on exam however he is in normal sinus rhythm.  We discussed obtaining a zio patch monitor to evaluate further but he declines this at the present time

## 2023-08-27 ENCOUNTER — Other Ambulatory Visit: Payer: Self-pay

## 2023-08-27 ENCOUNTER — Telehealth: Payer: Self-pay

## 2023-08-27 DIAGNOSIS — R002 Palpitations: Secondary | ICD-10-CM

## 2023-08-27 NOTE — Telephone Encounter (Signed)
 Copied from CRM [id number]. Topic: Clinical - Order For Equipment >> Aug 27, 2023 12:55 PM Stanly Early wrote: Reason for CRM: patient saw MD [name] last Thursday 6/5 and they talk about a heart monitor and would like receive a heart monitor. Patient would like to know if the provider would like to see him. [phone]

## 2023-08-28 ENCOUNTER — Ambulatory Visit: Attending: Family Medicine

## 2023-08-28 DIAGNOSIS — R002 Palpitations: Secondary | ICD-10-CM

## 2023-08-28 NOTE — Progress Notes (Unsigned)
 EP to read.

## 2023-08-31 ENCOUNTER — Telehealth: Payer: Self-pay | Admitting: Cardiology

## 2023-08-31 NOTE — Telephone Encounter (Signed)
 Patient c/o Palpitations: STAT if patient c/o lightheadedness, shortness of breath, or chest pain  How long have you had palpitations/irregular HR/ Afib? Are you having the symptoms now? 2 weeks  Are you currently experiencing lightheadedness, SOB or CP? No   Do you have a history of afib (atrial fibrillation) or irregular heart rhythm? No   Have you checked your BP or HR? (document readings if available): N/A  Are you experiencing any other symptoms? No

## 2023-08-31 NOTE — Telephone Encounter (Signed)
 Returned call to pt-   Patient states he has been having palpitations or skipping beats for the last two weeks- maybe a little more. Saw PCP- he suggested a heart monitor (patient got one in the mail) but he isn't sure if he wants to wear it.   Advised patient to wear heart monitor and we can follow up based on results.

## 2023-09-03 ENCOUNTER — Ambulatory Visit: Payer: Self-pay | Admitting: *Deleted

## 2023-09-03 NOTE — Telephone Encounter (Signed)
 Copied from CRM [id number]. Topic: Clinical - Red Word Triage >> Sep 03, 2023 12:02 PM Carla L wrote: Red Word that prompted transfer to Nurse Triage: Reaction to adhesive of heart monitor, possible infection due to cuts after shaving area to place monitor Reason for Disposition  [1] Skin around the wound has become red AND [2] larger than 2 inches (5 cm)  Answer Assessment - Initial Assessment Questions 1. LOCATION: Where is the wound located?      I'm on a home cardiac monitor.   I had to shave before putting on the electrodes.  I shaved off a few of the moles and they have been bleeding.    I'm concerned about infection. I'm to be on it 14 days.     2. WOUND APPEARANCE: What does the wound look like?      They are underneath the electrodes.    3. SIZE: If redness is present, ask: What is the size of the red area? (Inches, centimeters, or compare to size of a coin)      Moles 4. SPREAD: What's changed in the last day?  Do you see any red streaks coming from the wound?     The moles are sore. 5. ONSET: When did it start to look infected?      It's under the electrodes that are sore.    It's itching some too. 6. MECHANISM: How did the wound start, what was the cause?     I had to shave before putting on the electrodes for this cardiac monitor.  I shaved some moles and now they are sore and itching 7. PAIN: Do you have any pain?  If Yes, ask: How bad is the pain?  (e.g., Scale 1-10; mild, moderate, or severe)    - MILD (1-3): Doesn't interfere with normal activities.     - MODERATE (4-7): Interferes with normal activities or awakens from sleep.    - SEVERE (8-10): Excruciating pain, unable to do any normal activities.       Itching and irritated. 8. FEVER: Do you have a fever? If Yes, ask: What is your temperature, how was it measured, and when did it start?     Not asked 9. OTHER SYMPTOMS: Do you have any other symptoms? (e.g., shaking chills, weakness, rash elsewhere  on body)     No 10. PREGNANCY: Is there any chance you are pregnant? When was your last menstrual period?       N/A  Protocols used: Wound Infection Suspected-A-AH FYI Only or Action Required?: Action required by provider  Patient was last seen in primary care on 08/23/2023 by Austine Lefort, MD. Called Nurse Triage reporting Wound Infection. Symptoms began several days ago. Interventions attempted: Nothing. Symptoms are: gradually worsening.  Triage Disposition: See Within 3 Days in Office  Patient/caregiver understands and will follow disposition?: Yes

## 2023-09-04 DIAGNOSIS — Z961 Presence of intraocular lens: Secondary | ICD-10-CM | POA: Diagnosis not present

## 2023-09-04 DIAGNOSIS — H353132 Nonexudative age-related macular degeneration, bilateral, intermediate dry stage: Secondary | ICD-10-CM | POA: Diagnosis not present

## 2023-09-05 ENCOUNTER — Ambulatory Visit (INDEPENDENT_AMBULATORY_CARE_PROVIDER_SITE_OTHER): Admitting: Family Medicine

## 2023-09-05 ENCOUNTER — Encounter: Payer: Self-pay | Admitting: Family Medicine

## 2023-09-05 VITALS — BP 122/82 | HR 69 | Temp 98.2°F | Ht 71.0 in | Wt 177.0 lb

## 2023-09-05 DIAGNOSIS — R002 Palpitations: Secondary | ICD-10-CM

## 2023-09-05 DIAGNOSIS — L231 Allergic contact dermatitis due to adhesives: Secondary | ICD-10-CM

## 2023-09-05 NOTE — Progress Notes (Signed)
 Patient Office Visit  Assessment & Plan:  Contact dermatitis due to adhesives, unspecified contact dermatitis type  Palpitations   Assessment and Plan    Palpitations Intermittent palpitations with prolonged episode. Zeopatch monitoring for atrial fibrillation or PVCs. No recent episodes. Explained stroke risk with atrial fibrillation. - Continue Zeopatch monitoring for atrial fibrillation or PVCs. - Avoid caffeine and stimulants. - Reassess if palpitations recur or new symptoms develop.  Skin irritation due to Zeopatch Skin irritation with burning and itching under Zeopatch. Likely adhesive reaction. No infection signs. Not on anticoagulants. - Monitor for infection signs. - Consider hydrocortisone  cream around patch if irritation worsens. - Avoid prednisone. - Continue Zeopatch unless irritation intolerable.       No follow-ups on file.   Subjective:    Patient ID: Joel Bauer, male    DOB: 1933-06-20  Age: 89 y.o. MRN: [id number]  Chief Complaint  Patient presents with   Rash    Pt has on a heart monitor and has noticed redness/rash around where the heart monitor is.     Rash   Discussed the use of AI scribe software for clinical note transcription with the patient, who gave verbal consent to proceed.  History of Present Illness        Joel Bauer is an 89 year old male who presents with irritation and itching under a Ziopatch. patient place the patch aroung June 14th  He has been wearing a Ziopatch since September 01, 2023, for monitoring palpitations. He experiences irritation and itching under the patch, which he attributes to the adhesive and the presence of multiple moles in the area. The irritation began after following instructions to stroke and clean the area with a rough material  (like sandpaper) 40 times, which caused bleeding of some moles. Patient did not clean the area 40 times thinking it was excessive. He describes a burning and itching  sensation but has not used any creams or medications for the irritation. No worsening of the irritation compared to the previous day. He is not on blood thinners and may have an allergy to certain adhesives. More concerned about possible infection. No hx of diabetes.   Regarding his palpitations, possible PVCs, he experienced several episodes of 'skipping and missing beats' over the past three weeks prior to wearing the Ziopatch. Since applying the patch, he noted only two episodes on the first day, one on the second day, and none thereafter. No associated shortness of breath, chest pain, or syncope. He describes the palpitations as strong and sometimes like a 'flip flop' sensation.   He denies smoking, cocaine, or marijuana use. He consumes one cup of coffee daily, usually decaffeinated.  No shortness of breath, chest pain, or syncope  or presyncope associated with palpitations. He reports itching and irritation under the Ziopatch but no signs of infection such as pus or increased redness. Physical Exam SKIN: Skin appears irritated. No signs of infection, no pus, no redness, warmth, or streaking. Results Assessment & Plan Palpitations Intermittent palpitations with prolonged episode. Ziopatch monitoring for possible atrial fibrillation and/or PVCs. No recent episodes. Explained stroke risk with atrial fibrillation if that is found - Continue Ziopatch monitoring for atrial fibrillation or PVCs for now unless the allergic reaction worsens - Avoid caffeine and stimulants. - Reassess if palpitations recur or new symptoms develop.  Skin irritation due to Ziopatch adhesive Skin irritation with burning and itching under Ziopatch. Likely adhesive reaction. No infection signs. Not on anticoagulants. - Monitor for infection  signs. - Consider hydrocortisone  cream around patch if irritation worsens. - Avoid prednisone due patient's previous intolerance - Continue Ziopatch for now unless irritation  worsens   The ASCVD Risk score (Arnett DK, et al., 2019) failed to calculate for the following reasons:   The 2019 ASCVD risk score is only valid for ages 26 to 54  Past Medical History:  Diagnosis Date   Adenomatous colon polyp 2001   Allergy    hay fever type   Anemia    as a child    Aortic regurgitation    a. mild by echo 03/2015   Arthritis    mild condition   Cancer (HCC)    hx of skin cancer    Chest pain 03/31/2015   Chronic kidney disease 2018   kidney stones removed twice   Depression    Diverticulitis large intestine 04/19/2015   Diverticulitis of colon - recurrent/persistent 12/29/2014   Diverticulosis of colon (without mention of hemorrhage) 2005   GERD (gastroesophageal reflux disease) long time   take medication.   Heart murmur    History of kidney stones    History of skin cancer    Hyperlipidemia    Hypertension several years   never seriously high   Hypertensive heart disease    LLQ pain 11/26/2014   Macular degeneration    left eye   Orthostasis    Pleurisy    Premature atrial contractions    Prostatitis    PVC's (premature ventricular contractions)    Recurrent colitis due to Clostridium difficile    Retinal hemorrhage    SCC (squamous cell carcinoma) 11/13/2018   RIGHT FOREARM CX3 5FU   Shingles    Small bowel obstruction (HCC) 05/31/2016   Squamous cell carcinoma of skin 07/23/2017   SCC IN SITU LEFT UPPER NOSE BRIDGE CX3 5FU CAUTERY   Past Surgical History:  Procedure Laterality Date   CATARACTS REMOVED     COLON SURGERY  2019+/_   COLONOSCOPY     COLONOSCOPY N/A 06/15/2014   Procedure: COLONOSCOPY;  Surgeon: Kenney Peacemaker, MD;  Location: [facility] ENDOSCOPY;  Service: Endoscopy;  Laterality: N/A;   CYSTOSCOPY WITH RETROGRADE PYELOGRAM, URETEROSCOPY AND STENT PLACEMENT Right 10/18/2013   Procedure: CYSTOSCOPY WITH RIGHT RETROGRADE/RIGHT URETEROSCOPY, STONE BASKETRY,  AND STENT PLACEMENT RIGHT;  Surgeon: Osborn Blaze, MD;  Location: [facility];   Service: Urology;  Laterality: Right;   EYE SURGERY  2017?   3 injections to left eye for macular degeneration    FMT  06/19/2014   LAPAROSCOPIC LOW ANTERIOR RESECTION N/A 04/19/2015   Procedure: LAPAROSCOPIC ASSISTED  LOW ANTERIOR RESECTION, splenic flexure mobilization;  Surgeon: Boyce Byes, MD;  Location: [facility];  Service: General;  Laterality: N/A;   LITHOTRIPSY     TONSILLECTOMY     Social History   Tobacco Use   Smoking status: Never   Smokeless tobacco: Never  Vaping Use   Vaping status: Never Used  Substance Use Topics   Alcohol  use: No    Alcohol /week: 0.0 standard drinks of alcohol    Drug use: No   Family History  Problem Relation Age of Onset   Breast cancer Mother    Heart disease Mother    Colon cancer Father    Colon polyps Father    Cancer Father    Kidney disease Sister    Allergies  Allergen Reactions   Rosuvastatin  Other (See Comments)    Pt reports causes muscle cramps in legs    Review of Systems  Skin:  Positive for rash.      Objective:    BP 122/82   Pulse 69   Temp 98.2 F (36.8 C)   Ht 5' 11 (1.803 m)   Wt 177 lb (80.3 kg)   SpO2 98%   BMI 24.69 kg/m  BP Readings from Last 3 Encounters:  09/05/23 122/82  08/23/23 120/62  06/14/23 110/70   Wt Readings from Last 3 Encounters:  09/05/23 177 lb (80.3 kg)  08/23/23 175 lb 6.4 oz (79.6 kg)  06/14/23 177 lb (80.3 kg)    Physical Exam Vitals and nursing note reviewed.  Constitutional:      Appearance: Normal appearance.  HENT:     Head: Normocephalic.     Right Ear: Tympanic membrane normal.     Left Ear: Tympanic membrane normal.   Eyes:     Extraocular Movements: Extraocular movements intact.     Conjunctiva/sclera: Conjunctivae normal.     Pupils: Pupils are equal, round, and reactive to light.    Cardiovascular:     Rate and Rhythm: Normal rate and regular rhythm.     Heart sounds: Normal heart sounds.  Pulmonary:     Effort: Pulmonary effort is normal.      Breath sounds: Normal breath sounds.   Skin:    Findings: Rash present.     Comments: Ziopatch with adhesive on left side chest. Slightly irritated under and around it, no streaking, not warm to the touch. No drainage noted. Pt has multiple pigmented nevi over chest area.    Neurological:     General: No focal deficit present.     Mental Status: He is alert and oriented to person, place, and time.      No results found for any visits on 09/05/23.

## 2023-09-11 ENCOUNTER — Ambulatory Visit: Admitting: Family Medicine

## 2023-09-13 ENCOUNTER — Encounter: Payer: Self-pay | Admitting: Physical Therapy

## 2023-09-13 ENCOUNTER — Ambulatory Visit: Admitting: Physical Therapy

## 2023-09-13 DIAGNOSIS — M25551 Pain in right hip: Secondary | ICD-10-CM

## 2023-09-13 NOTE — Therapy (Signed)
 OUTPATIENT PHYSICAL THERAPY LOWER EXTREMITY EVALUATION   Patient Name: Joel Bauer MRN: [id number] DOB:[birth year], 89 y.o., male Today's Date: 09/13/2023  END OF SESSION:  PT End of Session - 09/13/23 2143     Visit Number 1    Number of Visits 12    Date for PT Re-Evaluation 11/22/23    Authorization Type BCBS Medicare    PT Start Time 1518    PT Stop Time 1555    PT Time Calculation (min) 37 min    Activity Tolerance Patient tolerated treatment well    Behavior During Therapy [facility] for tasks assessed/performed          Past Medical History:  Diagnosis Date   Adenomatous colon polyp 2001   Allergy    hay fever type   Anemia    as a child    Aortic regurgitation    a. mild by echo 03/2015   Arthritis    mild condition   Cancer (HCC)    hx of skin cancer    Chest pain 03/31/2015   Chronic kidney disease 2018   kidney stones removed twice   Depression    Diverticulitis large intestine 04/19/2015   Diverticulitis of colon - recurrent/persistent 12/29/2014   Diverticulosis of colon (without mention of hemorrhage) 2005   GERD (gastroesophageal reflux disease) long time   take medication.   Heart murmur    History of kidney stones    History of skin cancer    Hyperlipidemia    Hypertension several years   never seriously high   Hypertensive heart disease    LLQ pain 11/26/2014   Macular degeneration    left eye   Orthostasis    Pleurisy    Premature atrial contractions    Prostatitis    PVC's (premature ventricular contractions)    Recurrent colitis due to Clostridium difficile    Retinal hemorrhage    SCC (squamous cell carcinoma) 11/13/2018   RIGHT FOREARM CX3 5FU   Shingles    Small bowel obstruction (HCC) 05/31/2016   Squamous cell carcinoma of skin 07/23/2017   SCC IN SITU LEFT UPPER NOSE BRIDGE CX3 5FU CAUTERY   Past Surgical History:  Procedure Laterality Date   CATARACTS REMOVED     COLON SURGERY  2019+/_   COLONOSCOPY     COLONOSCOPY N/A  06/15/2014   Procedure: COLONOSCOPY;  Surgeon: [name], MD;  Location: [facility] ENDOSCOPY;  Service: Endoscopy;  Laterality: N/A;   CYSTOSCOPY WITH RETROGRADE PYELOGRAM, URETEROSCOPY AND STENT PLACEMENT Right 10/18/2013   Procedure: CYSTOSCOPY WITH RIGHT RETROGRADE/RIGHT URETEROSCOPY, STONE BASKETRY,  AND STENT PLACEMENT RIGHT;  Surgeon: Ricardo Likens, MD;  Location: [facility] ORS;  Service: Urology;  Laterality: Right;   EYE SURGERY  2017?   3 injections to left eye for macular degeneration    FMT  06/19/2014   LAPAROSCOPIC LOW ANTERIOR RESECTION N/A 04/19/2015   Procedure: LAPAROSCOPIC ASSISTED  LOW ANTERIOR RESECTION, splenic flexure mobilization;  Surgeon: Elon Pacini, MD;  Location: [facility] ORS;  Service: General;  Laterality: N/A;   LITHOTRIPSY     TONSILLECTOMY     Patient Active Problem List   Diagnosis Date Noted   Hyperlipidemia 01/19/2020   Palpitations 07/17/2016   Abdominal pain 05/31/2016   Small bowel obstruction (HCC) 05/31/2016   Hypokalemia 05/31/2016   Aortic regurgitation    Hypertensive heart disease    Hypertension    Orthostasis    Diverticulitis large intestine 04/19/2015   Chest pain 03/31/2015   Pre-operative clearance  03/31/2015   Diverticulitis of colon - recurrent/persistent 12/29/2014   LLQ pain 11/26/2014   History of Clostridium difficile 12/17/2013   History of diverticulitis 12/17/2013   History of Clostridium difficile infection 12/17/2013   Hx of pseudomembranous enterocolitis 04/17/2013   Abdominal pain, left lower quadrant 04/13/2013   Glaucoma     PCP: [facility] Burr   REFERRING PROVIDER: [facility] Burr   REFERRING DIAG: Inguinal pain   THERAPY DIAG:  Pain in right hip  Rationale for Evaluation and Treatment: Rehabilitation  ONSET DATE:    SUBJECTIVE:   SUBJECTIVE STATEMENT  Pt states onset of Right Inguinal Pain in late March, no incident to report. He did have pain more in muscle of high thigh/quad, but now pain more along groin  line. He was told he does have small hernia, but MD does not think it is causing his pain.  States Pain with initial standing, pain variable, some pain with lifting leg.   No pain with Walking longer distances or Stairs, no pain with getting in car.   Has bike and weights at home.  Would like to return to pickelball, golf, etc.  Walks: 1.5 mi/  40 min/ a few days/week    PERTINENT HISTORY: CKD, kidney sones, HTN,Skin CA, heart palpitations    PAIN:  Are you having pain? Yes: NPRS scale: 2-3/10 Pain location: R hip/groin Pain description: sore Aggravating factors: lifting leg, initial standing/transfers Relieving factors: none stated   PRECAUTIONS: None  WEIGHT BEARING RESTRICTIONS: No  FALLS:  Has patient fallen in last 6 months? No   PLOF: Independent  PATIENT GOALS:  Decreased pain in groin  NEXT MD VISIT:   OBJECTIVE:   DIAGNOSTIC FINDINGS:   PATIENT SURVEYS:    COGNITION: Overall cognitive status: Within functional limits for tasks assessed     SENSATION: WFL  EDEMA:    POSTURE:    No Significant postural limitations  PALPATION:  Tenderness along R groin line, increased towards pubic symphysis,  mild pain along adductor insertions.    LOWER EXTREMITY ROM: Knee: WFL,  R hip: WFL- mild pain with ER- pull of adductors.   LOWER EXTREMITY MMT:  MMT Left eval Right  eval  Hip flexion  3/pain  Hip extension    Hip abduction  4  Hip adduction    Hip internal rotation    Hip external rotation    Knee flexion  5  Knee extension  5  Ankle dorsiflexion    Ankle plantarflexion    Ankle inversion    Ankle eversion     (Blank rows = not tested)  LOWER EXTREMITY SPECIAL TESTS:  Increased pain with SLR,  no pain with supine march   GAIT: unremarkable   TODAY'S TREATMENT:                                                                                                                              DATE:  Eval: ther ex: see  below for  HEP   PATIENT EDUCATION:  Education details: PT POC, Exam findings, HEP Person educated: Patient Education method: Explanation, Demonstration, Tactile cues, Verbal cues, and Handouts Education comprehension: verbalized understanding, returned demonstration, verbal cues required, tactile cues required, and needs further education   HOME EXERCISE PROGRAM: Access Code: [email or web address] URL: [email or web address] Date: 09/13/2023 Prepared by: Tinnie Don  Exercises - Bent Knee Fallouts  - 1 x daily - 1 sets - 10-15 reps - 3-5 sec  hold - Side Lunge Adductor Stretch  - 1 x daily - 1 sets - 10 reps - 5 sec  hold    ASSESSMENT:  CLINICAL IMPRESSION: Patient presents with primary complaint of  pain in R hip. He has most pain along groin line with palpation, as well as with attempt for SLR. He has good hip range of motion without stiffness. He has increased pain in hip flexor and adductor musculature, and increased pain with activity. He was given initial mobility to perform with HEP, will continue to monitor pain in future sessions for determining source of pain, muscle strain vs hernia. Pt with decreased ability for full functional activities. Pt will  benefit from skilled PT to improve deficits and pain and to return to PLOF.   OBJECTIVE IMPAIRMENTS: decreased activity tolerance, decreased ROM, decreased strength, increased muscle spasms, and pain.   ACTIVITY LIMITATIONS: bending, sitting, standing, squatting, transfers, and locomotion level  PARTICIPATION LIMITATIONS: cleaning, driving, shopping, and community activity  PERSONAL FACTORS: Time since onset of injury/illness/exacerbation are also affecting patient's functional outcome.   REHAB POTENTIAL: Good  CLINICAL DECISION MAKING: Stable/uncomplicated  EVALUATION COMPLEXITY: Low   GOALS: Goals reviewed with patient? Yes  SHORT TERM GOALS: Target date: 10/04/2023   Pt to be independent with initial HEP  Goal  status: INITIAL   LONG TERM GOALS: Target date: 11/22/2023   Pt to be independent with final HEP  Goal status: INITIAL  2.  Pt to demo ability for hip rom in all directions without pain , to improve ability for IADLs.   Goal status: INITIAL  3.  Pt to demo ability to lift R Leg against gravity without pain, strength at least 4/5  Goal status: INITIAL  4.  Pt to report at least 75 % improvement in overall symptoms.   Goal status: INITIAL   PLAN:  PT FREQUENCY: 1-2x/week  PT DURATION: 10 weeks  PLANNED INTERVENTIONS: Therapeutic exercises, Therapeutic activity, Neuromuscular re-education, Patient/Family education, Self Care, Joint mobilization, Joint manipulation, Stair training, Orthotic/Fit training, DME instructions, Aquatic Therapy, Dry Needling, Electrical stimulation, Cryotherapy, Moist heat, Taping, Ultrasound, Ionotophoresis 4mg /ml Dexamethasone , Manual therapy,  Vasopneumatic device, Traction, Spinal manipulation, Spinal mobilization,Balance training, Gait training,   PLAN FOR NEXT SESSION:    Tinnie Don, PT, DPT 9:44 PM  09/13/23

## 2023-09-18 ENCOUNTER — Other Ambulatory Visit: Payer: Self-pay | Admitting: Family Medicine

## 2023-09-21 DIAGNOSIS — R002 Palpitations: Secondary | ICD-10-CM | POA: Diagnosis not present

## 2023-09-25 ENCOUNTER — Encounter: Payer: Self-pay | Admitting: Physical Therapy

## 2023-09-25 ENCOUNTER — Ambulatory Visit: Admitting: Physical Therapy

## 2023-09-25 DIAGNOSIS — M25551 Pain in right hip: Secondary | ICD-10-CM | POA: Diagnosis not present

## 2023-09-25 DIAGNOSIS — R002 Palpitations: Secondary | ICD-10-CM

## 2023-09-25 NOTE — Therapy (Unsigned)
 OUTPATIENT PHYSICAL THERAPY LOWER EXTREMITY TREATMENT   Patient Name: RAYHAN GROLEAU MRN: [id number] DOB:1933-08-06, 89 y.o., male Today's Date: 09/25/2023  END OF SESSION:  PT End of Session - 09/25/23 1355     Visit Number 2    Number of Visits 12    Date for PT Re-Evaluation 11/22/23    Authorization Type BCBS Medicare    PT Start Time 1351    PT Stop Time 1430    PT Time Calculation (min) 39 min    Activity Tolerance Patient tolerated treatment well    Behavior During Therapy WFL for tasks assessed/performed           Past Medical History:  Diagnosis Date   Adenomatous colon polyp 2001   Allergy    hay fever type   Anemia    as a child    Aortic regurgitation    a. mild by echo 03/2015   Arthritis    mild condition   Cancer (HCC)    hx of skin cancer    Chest pain 03/31/2015   Chronic kidney disease 2018   kidney stones removed twice   Depression    Diverticulitis large intestine 04/19/2015   Diverticulitis of colon - recurrent/persistent 12/29/2014   Diverticulosis of colon (without mention of hemorrhage) 2005   GERD (gastroesophageal reflux disease) long time   take medication.   Heart murmur    History of kidney stones    History of skin cancer    Hyperlipidemia    Hypertension several years   never seriously high   Hypertensive heart disease    LLQ pain 11/26/2014   Macular degeneration    left eye   Orthostasis    Pleurisy    Premature atrial contractions    Prostatitis    PVC's (premature ventricular contractions)    Recurrent colitis due to Clostridium difficile    Retinal hemorrhage    SCC (squamous cell carcinoma) 11/13/2018   RIGHT FOREARM CX3 5FU   Shingles    Small bowel obstruction (HCC) 05/31/2016   Squamous cell carcinoma of skin 07/23/2017   SCC IN SITU LEFT UPPER NOSE BRIDGE CX3 5FU CAUTERY   Past Surgical History:  Procedure Laterality Date   CATARACTS REMOVED     COLON SURGERY  2019+/_   COLONOSCOPY     COLONOSCOPY N/A  06/15/2014   Procedure: COLONOSCOPY;  Surgeon: [name], MD;  Location: [facility] ENDOSCOPY;  Service: Endoscopy;  Laterality: N/A;   CYSTOSCOPY WITH RETROGRADE PYELOGRAM, URETEROSCOPY AND STENT PLACEMENT Right 10/18/2013   Procedure: CYSTOSCOPY WITH RIGHT RETROGRADE/RIGHT URETEROSCOPY, STONE BASKETRY,  AND STENT PLACEMENT RIGHT;  Surgeon: Ricardo Likens, MD;  Location: [facility];  Service: Urology;  Laterality: Right;   EYE SURGERY  2017?   3 injections to left eye for macular degeneration    FMT  06/19/2014   LAPAROSCOPIC LOW ANTERIOR RESECTION N/A 04/19/2015   Procedure: LAPAROSCOPIC ASSISTED  LOW ANTERIOR RESECTION, splenic flexure mobilization;  Surgeon: Elon Pacini, MD;  Location: [facility] ORS;  Service: General;  Laterality: N/A;   LITHOTRIPSY     TONSILLECTOMY     Patient Active Problem List   Diagnosis Date Noted   Hyperlipidemia 01/19/2020   Palpitations 07/17/2016   Abdominal pain 05/31/2016   Small bowel obstruction (HCC) 05/31/2016   Hypokalemia 05/31/2016   Aortic regurgitation    Hypertensive heart disease    Hypertension    Orthostasis    Diverticulitis large intestine 04/19/2015   Chest pain 03/31/2015   Pre-operative  clearance 03/31/2015   Diverticulitis of colon - recurrent/persistent 12/29/2014   LLQ pain 11/26/2014   History of Clostridium difficile 12/17/2013   History of diverticulitis 12/17/2013   History of Clostridium difficile infection 12/17/2013   Hx of pseudomembranous enterocolitis 04/17/2013   Abdominal pain, left lower quadrant 04/13/2013   Glaucoma     PCP: [facility] Burr   REFERRING PROVIDER: Butler Burr   REFERRING DIAG: Inguinal pain   THERAPY DIAG:  Pain in right hip  Rationale for Evaluation and Treatment: Rehabilitation  ONSET DATE:    SUBJECTIVE:   SUBJECTIVE STATEMENT Pt states minimal change in pain.   Eval: Pt states onset of Right Inguinal Pain in late March, no incident to report. He did have pain more in muscle of high  thigh/quad, but now pain more along groin line. He was told he does have small hernia, but MD does not think it is causing his pain.  States Pain with initial standing, pain variable, some pain with lifting leg.   No pain with Walking longer distances or Stairs, no pain with getting in car.   Has bike and weights at home.  Would like to return to pickelball, golf, etc.  Walks: 1.5 mi/  40 min/ a few days/week    PERTINENT HISTORY: CKD, kidney sones, HTN,Skin CA, heart palpitations    PAIN:  Are you having pain? Yes: NPRS scale: 2-3/10 Pain location: R hip/groin Pain description: sore Aggravating factors: lifting leg, initial standing/transfers Relieving factors: none stated   PRECAUTIONS: None  WEIGHT BEARING RESTRICTIONS: No  FALLS:  Has patient fallen in last 6 months? No   PLOF: Independent  PATIENT GOALS:  Decreased pain in groin  NEXT MD VISIT:   OBJECTIVE:   DIAGNOSTIC FINDINGS:   PATIENT SURVEYS:    COGNITION: Overall cognitive status: Within functional limits for tasks assessed     SENSATION: WFL  EDEMA:    POSTURE:    No Significant postural limitations  PALPATION:  Tenderness along R groin line, increased towards pubic symphysis,  mild pain along adductor insertions.    LOWER EXTREMITY ROM: Knee: WFL,  R hip: WFL- mild pain with ER- pull of adductors.   LOWER EXTREMITY MMT:  MMT Left eval Right  eval  Hip flexion  3/pain  Hip extension    Hip abduction  4  Hip adduction    Hip internal rotation    Hip external rotation    Knee flexion  5  Knee extension  5  Ankle dorsiflexion    Ankle plantarflexion    Ankle inversion    Ankle eversion     (Blank rows = not tested)  LOWER EXTREMITY SPECIAL TESTS:  Increased pain with SLR,  no pain with supine march   GAIT: unremarkable   TODAY'S TREATMENT:  DATE:    09/25/2023 Therapeutic Exercise: Aerobic: Supine:  hip ER fallouts x 15;  LTR x 10;  ball squeeze x 15;  march with TA x 15 (no pain);  (Pain with SLR test)  Seated:  LAQ x 15 on R;  Standing: Stretches:  Neuromuscular Re-education: Manual Therapy:  STM to R adductors at groin line, manual and with roller  , LAD for R hip, manual hip add stretch Therapeutic Activity: Self Care:    PATIENT EDUCATION:  Education details: updated and reviewed HEP Person educated: Patient Education method: Explanation, Demonstration, Tactile cues, Verbal cues, and Handouts Education comprehension: verbalized understanding, returned demonstration, verbal cues required, tactile cues required, and needs further education   HOME EXERCISE PROGRAM: Access Code: [email or web address] URL: [email or web address] Date: 09/13/2023 Prepared by: Tinnie Don  Exercises - Bent Knee Fallouts  - 1 x daily - 1 sets - 10-15 reps - 3-5 sec  hold - Side Lunge Adductor Stretch  - 1 x daily - 1 sets - 10 reps - 5 sec  hold    ASSESSMENT:  CLINICAL IMPRESSION: No pain with most motions. Does have pain with trial for SLR and with lifting leg up into bed.   Eval: Patient presents with primary complaint of  pain in R hip. He has most pain along groin line with palpation, as well as with attempt for SLR. He has good hip range of motion without stiffness. He has increased pain in hip flexor and adductor musculature, and increased pain with activity. He was given initial mobility to perform with HEP, will continue to monitor pain in future sessions for determining source of pain, muscle strain vs hernia. Pt with decreased ability for full functional activities. Pt will  benefit from skilled PT to improve deficits and pain and to return to PLOF.   OBJECTIVE IMPAIRMENTS: decreased activity tolerance, decreased ROM, decreased strength, increased muscle spasms, and pain.   ACTIVITY LIMITATIONS: bending, sitting, standing,  squatting, transfers, and locomotion level  PARTICIPATION LIMITATIONS: cleaning, driving, shopping, and community activity  PERSONAL FACTORS: Time since onset of injury/illness/exacerbation are also affecting patient's functional outcome.   REHAB POTENTIAL: Good  CLINICAL DECISION MAKING: Stable/uncomplicated  EVALUATION COMPLEXITY: Low   GOALS: Goals reviewed with patient? Yes  SHORT TERM GOALS: Target date: 10/04/2023   Pt to be independent with initial HEP  Goal status: INITIAL   LONG TERM GOALS: Target date: 11/22/2023   Pt to be independent with final HEP  Goal status: INITIAL  2.  Pt to demo ability for hip rom in all directions without pain , to improve ability for IADLs.   Goal status: INITIAL  3.  Pt to demo ability to lift R Leg against gravity without pain, strength at least 4/5  Goal status: INITIAL  4.  Pt to report at least 75 % improvement in overall symptoms.   Goal status: INITIAL   PLAN:  PT FREQUENCY: 1-2x/week  PT DURATION: 10 weeks  PLANNED INTERVENTIONS: Therapeutic exercises, Therapeutic activity, Neuromuscular re-education, Patient/Family education, Self Care, Joint mobilization, Joint manipulation, Stair training, Orthotic/Fit training, DME instructions, Aquatic Therapy, Dry Needling, Electrical stimulation, Cryotherapy, Moist heat, Taping, Ultrasound, Ionotophoresis 4mg /ml Dexamethasone , Manual therapy,  Vasopneumatic device, Traction, Spinal manipulation, Spinal mobilization,Balance training, Gait training,   PLAN FOR NEXT SESSION:    Tinnie Don, PT, DPT 1:55 PM  09/25/23

## 2023-09-27 ENCOUNTER — Ambulatory Visit: Payer: Self-pay | Admitting: Family Medicine

## 2023-10-02 ENCOUNTER — Other Ambulatory Visit: Payer: Self-pay | Admitting: Family Medicine

## 2023-10-02 DIAGNOSIS — R002 Palpitations: Secondary | ICD-10-CM

## 2023-10-03 ENCOUNTER — Encounter: Admitting: Physical Therapy

## 2023-10-04 ENCOUNTER — Ambulatory Visit: Admitting: Physical Therapy

## 2023-10-04 ENCOUNTER — Other Ambulatory Visit: Payer: Self-pay

## 2023-10-04 ENCOUNTER — Encounter: Payer: Self-pay | Admitting: Physical Therapy

## 2023-10-04 DIAGNOSIS — M25551 Pain in right hip: Secondary | ICD-10-CM | POA: Diagnosis not present

## 2023-10-04 MED ORDER — AMLODIPINE BESYLATE 10 MG PO TABS: 10.0000 mg | ORAL_TABLET | Freq: Every day | ORAL | 2 refills | Status: AC

## 2023-10-04 NOTE — Therapy (Signed)
 OUTPATIENT PHYSICAL THERAPY LOWER EXTREMITY TREATMENT   Patient Name: Joel Bauer MRN: [id number] DOB:1933-06-16, 89 y.o., male Today's Date: 10/04/2023  END OF SESSION:  PT End of Session - 10/04/23 1304     Visit Number 3    Number of Visits 12    Date for PT Re-Evaluation 11/22/23    Authorization Type BCBS Medicare    PT Start Time 1306    PT Stop Time 1345    PT Time Calculation (min) 39 min    Activity Tolerance Patient tolerated treatment well    Behavior During Therapy WFL for tasks assessed/performed           Past Medical History:  Diagnosis Date   Adenomatous colon polyp 2001   Allergy    hay fever type   Anemia    as a child    Aortic regurgitation    a. mild by echo 03/2015   Arthritis    mild condition   Cancer (HCC)    hx of skin cancer    Chest pain 03/31/2015   Chronic kidney disease 2018   kidney stones removed twice   Depression    Diverticulitis large intestine 04/19/2015   Diverticulitis of colon - recurrent/persistent 12/29/2014   Diverticulosis of colon (without mention of hemorrhage) 2005   GERD (gastroesophageal reflux disease) long time   take medication.   Heart murmur    History of kidney stones    History of skin cancer    Hyperlipidemia    Hypertension several years   never seriously high   Hypertensive heart disease    LLQ pain 11/26/2014   Macular degeneration    left eye   Orthostasis    Pleurisy    Premature atrial contractions    Prostatitis    PVC's (premature ventricular contractions)    Recurrent colitis due to Clostridium difficile    Retinal hemorrhage    SCC (squamous cell carcinoma) 11/13/2018   RIGHT FOREARM CX3 5FU   Shingles    Small bowel obstruction (HCC) 05/31/2016   Squamous cell carcinoma of skin 07/23/2017   SCC IN SITU LEFT UPPER NOSE BRIDGE CX3 5FU CAUTERY   Past Surgical History:  Procedure Laterality Date   CATARACTS REMOVED     COLON SURGERY  2019+/_   COLONOSCOPY     COLONOSCOPY  N/A 06/15/2014   Procedure: COLONOSCOPY;  Surgeon: [name], MD;  Location: [facility] ENDOSCOPY;  Service: Endoscopy;  Laterality: N/A;   CYSTOSCOPY WITH RETROGRADE PYELOGRAM, URETEROSCOPY AND STENT PLACEMENT Right 10/18/2013   Procedure: CYSTOSCOPY WITH RIGHT RETROGRADE/RIGHT URETEROSCOPY, STONE BASKETRY,  AND STENT PLACEMENT RIGHT;  Surgeon: Ricardo Likens, MD;  Location: [facility] ORS;  Service: Urology;  Laterality: Right;   EYE SURGERY  2017?   3 injections to left eye for macular degeneration    FMT  06/19/2014   LAPAROSCOPIC LOW ANTERIOR RESECTION N/A 04/19/2015   Procedure: LAPAROSCOPIC ASSISTED  LOW ANTERIOR RESECTION, splenic flexure mobilization;  Surgeon: Elon Pacini, MD;  Location: [facility] ORS;  Service: General;  Laterality: N/A;   LITHOTRIPSY     TONSILLECTOMY     Patient Active Problem List   Diagnosis Date Noted   Hyperlipidemia 01/19/2020   Palpitations 07/17/2016   Abdominal pain 05/31/2016   Small bowel obstruction (HCC) 05/31/2016   Hypokalemia 05/31/2016   Aortic regurgitation    Hypertensive heart disease    Hypertension    Orthostasis    Diverticulitis large intestine 04/19/2015   Chest pain 03/31/2015   Pre-operative  clearance 03/31/2015   Diverticulitis of colon - recurrent/persistent 12/29/2014   LLQ pain 11/26/2014   History of Clostridium difficile 12/17/2013   History of diverticulitis 12/17/2013   History of Clostridium difficile infection 12/17/2013   Hx of pseudomembranous enterocolitis 04/17/2013   Abdominal pain, left lower quadrant 04/13/2013   Glaucoma     PCP: Butler Burr   REFERRING PROVIDER: Butler Burr   REFERRING DIAG: Inguinal pain   THERAPY DIAG:  Pain in right hip  Rationale for Evaluation and Treatment: Rehabilitation  ONSET DATE:    SUBJECTIVE:   SUBJECTIVE STATEMENT Pt states minimal change in pain. He also is having mild increase in R low back pain- newer since doing HEP.   Eval: Pt states onset of Right Inguinal Pain  in late March, no incident to report. He did have pain more in muscle of high thigh/quad, but now pain more along groin line. He was told he does have small hernia, but MD does not think it is causing his pain.  States Pain with initial standing, pain variable, some pain with lifting leg.   No pain with Walking longer distances or Stairs, no pain with getting in car.   Has bike and weights at home.  Would like to return to pickelball, golf, etc.  Walks: 1.5 mi/  40 min/ a few days/week    PERTINENT HISTORY: CKD, kidney sones, HTN,Skin CA, heart palpitations    PAIN:  Are you having pain? Yes: NPRS scale: 2-3/10 Pain location: R hip/groin Pain description: sore Aggravating factors: lifting leg, initial standing/transfers Relieving factors: none stated   PRECAUTIONS: None  WEIGHT BEARING RESTRICTIONS: No  FALLS:  Has patient fallen in last 6 months? No   PLOF: Independent  PATIENT GOALS:  Decreased pain in groin  NEXT MD VISIT:   OBJECTIVE:   DIAGNOSTIC FINDINGS:   PATIENT SURVEYS:    COGNITION: Overall cognitive status: Within functional limits for tasks assessed     SENSATION: WFL  EDEMA:    POSTURE:    No Significant postural limitations  PALPATION:  Tenderness along R groin line, increased towards pubic symphysis,  mild pain along adductor insertions.    LOWER EXTREMITY ROM: Knee: WFL,  R hip: WFL- mild pain with ER- pull of adductors.   LOWER EXTREMITY MMT:  MMT Left eval Right  eval  Hip flexion  3/pain  Hip extension    Hip abduction  4  Hip adduction    Hip internal rotation    Hip external rotation    Knee flexion  5  Knee extension  5  Ankle dorsiflexion    Ankle plantarflexion    Ankle inversion    Ankle eversion     (Blank rows = not tested)  LOWER EXTREMITY SPECIAL TESTS:  Increased pain with SLR,  no pain with supine march   GAIT: unremarkable   TODAY'S TREATMENT:  DATE:   10/04/2023 Therapeutic Exercise: Aerobic: Supine:    LTR x 10;  SKTC 30 sec x 3 on R;  ball squeeze with TA x 15;  Hip abd glams GTB with TA x 20; TA with breathing x 10;  Prom for hip abd and ER;  Seated:  sit to stand x 10- higher mat table;  S/L: hip abd/clams x 15 on R;  Standing:  Marching x 15- pain with more reps.  Stretches:  Neuromuscular Re-education: Manual Therapy:   Therapeutic Activity: Self Care:    Therapeutic Exercise: Aerobic: Supine:  hip ER fallouts x 15;  LTR x 10;  ball squeeze x 15;  march with TA x 15 (no pain);  (Pain with SLR test)  Seated:  LAQ x 15 on R;  Standing: Stretches:  Neuromuscular Re-education: Manual Therapy:  STM to R adductors at groin line, manual and with roller  , LAD for R hip, manual hip add stretch Therapeutic Activity: Self Care:    PATIENT EDUCATION:  Education details: updated and reviewed HEP Person educated: Patient Education method: Explanation, Demonstration, Tactile cues, Verbal cues, and Handouts Education comprehension: verbalized understanding, returned demonstration, verbal cues required, tactile cues required, and needs further education   HOME EXERCISE PROGRAM: Access Code: [email or web address] URL: [email or web address] Date: 09/13/2023 Prepared by: Tinnie Don  Exercises - Bent Knee Fallouts  - 1 x daily - 1 sets - 10-15 reps - 3-5 sec  hold - Side Lunge Adductor Stretch  - 1 x daily - 1 sets - 10 reps - 5 sec  hold    ASSESSMENT:  CLINICAL IMPRESSION: No pain with most activities today ,but does continue to have pain with attempts for SLR motion as well as with standing hip flexion today. Recommended he get appt with sports med for further assessment. Pt in agreement. Minimal change in pain with current hip mobility exercises he is performing.   Eval: Patient presents with primary complaint of  pain in R hip. He has most  pain along groin line with palpation, as well as with attempt for SLR. He has good hip range of motion without stiffness. He has increased pain in hip flexor and adductor musculature, and increased pain with activity. He was given initial mobility to perform with HEP, will continue to monitor pain in future sessions for determining source of pain, muscle strain vs hernia. Pt with decreased ability for full functional activities. Pt will  benefit from skilled PT to improve deficits and pain and to return to PLOF.   OBJECTIVE IMPAIRMENTS: decreased activity tolerance, decreased ROM, decreased strength, increased muscle spasms, and pain.   ACTIVITY LIMITATIONS: bending, sitting, standing, squatting, transfers, and locomotion level  PARTICIPATION LIMITATIONS: cleaning, driving, shopping, and community activity  PERSONAL FACTORS: Time since onset of injury/illness/exacerbation are also affecting patient's functional outcome.   REHAB POTENTIAL: Good  CLINICAL DECISION MAKING: Stable/uncomplicated  EVALUATION COMPLEXITY: Low   GOALS: Goals reviewed with patient? Yes  SHORT TERM GOALS: Target date: 10/04/2023   Pt to be independent with initial HEP  Goal status: INITIAL   LONG TERM GOALS: Target date: 11/22/2023   Pt to be independent with final HEP  Goal status: INITIAL  2.  Pt to demo ability for hip rom in all directions without pain , to improve ability for IADLs.   Goal status: INITIAL  3.  Pt to demo ability to lift R Leg against gravity without pain, strength at least 4/5  Goal status: INITIAL  4.  Pt to  report at least 75 % improvement in overall symptoms.   Goal status: INITIAL   PLAN:  PT FREQUENCY: 1-2x/week  PT DURATION: 10 weeks  PLANNED INTERVENTIONS: Therapeutic exercises, Therapeutic activity, Neuromuscular re-education, Patient/Family education, Self Care, Joint mobilization, Joint manipulation, Stair training, Orthotic/Fit training, DME instructions,  Aquatic Therapy, Dry Needling, Electrical stimulation, Cryotherapy, Moist heat, Taping, Ultrasound, Ionotophoresis 4mg /ml Dexamethasone , Manual therapy,  Vasopneumatic device, Traction, Spinal manipulation, Spinal mobilization,Balance training, Gait training,   PLAN FOR NEXT SESSION:    Tinnie Don, PT, DPT 1:15 PM  10/04/23

## 2023-10-10 ENCOUNTER — Ambulatory Visit: Admitting: Physical Therapy

## 2023-10-10 ENCOUNTER — Encounter: Payer: Self-pay | Admitting: Physical Therapy

## 2023-10-10 DIAGNOSIS — M25551 Pain in right hip: Secondary | ICD-10-CM

## 2023-10-10 NOTE — Therapy (Unsigned)
 OUTPATIENT PHYSICAL THERAPY LOWER EXTREMITY TREATMENT   Patient Name: Joel Bauer MRN: [id number] DOB:07/26/1933, 89 y.o., male Today's Date: 10/10/2023  END OF SESSION:  PT End of Session - 10/10/23 1353     Visit Number 4    Number of Visits 12    Date for PT Re-Evaluation 11/22/23    Authorization Type BCBS Medicare    PT Start Time 1349    PT Stop Time 1430    PT Time Calculation (min) 41 min    Activity Tolerance Patient tolerated treatment well    Behavior During Therapy [facility] for tasks assessed/performed            Past Medical History:  Diagnosis Date   Adenomatous colon polyp 2001   Allergy    hay fever type   Anemia    as a child    Aortic regurgitation    a. mild by echo 03/2015   Arthritis    mild condition   Cancer (HCC)    hx of skin cancer    Chest pain 03/31/2015   Chronic kidney disease 2018   kidney stones removed twice   Depression    Diverticulitis large intestine 04/19/2015   Diverticulitis of colon - recurrent/persistent 12/29/2014   Diverticulosis of colon (without mention of hemorrhage) 2005   GERD (gastroesophageal reflux disease) long time   take medication.   Heart murmur    History of kidney stones    History of skin cancer    Hyperlipidemia    Hypertension several years   never seriously high   Hypertensive heart disease    LLQ pain 11/26/2014   Macular degeneration    left eye   Orthostasis    Pleurisy    Premature atrial contractions    Prostatitis    PVC's (premature ventricular contractions)    Recurrent colitis due to Clostridium difficile    Retinal hemorrhage    SCC (squamous cell carcinoma) 11/13/2018   RIGHT FOREARM CX3 5FU   Shingles    Small bowel obstruction (HCC) 05/31/2016   Squamous cell carcinoma of skin 07/23/2017   SCC IN SITU LEFT UPPER NOSE BRIDGE CX3 5FU CAUTERY   Past Surgical History:  Procedure Laterality Date   CATARACTS REMOVED     COLON SURGERY  2019+/_   COLONOSCOPY     COLONOSCOPY  N/A 06/15/2014   Procedure: COLONOSCOPY;  Surgeon: [name], MD;  Location: [facility] ENDOSCOPY;  Service: Endoscopy;  Laterality: N/A;   CYSTOSCOPY WITH RETROGRADE PYELOGRAM, URETEROSCOPY AND STENT PLACEMENT Right 10/18/2013   Procedure: CYSTOSCOPY WITH RIGHT RETROGRADE/RIGHT URETEROSCOPY, STONE BASKETRY,  AND STENT PLACEMENT RIGHT;  Surgeon: Ricardo Likens, MD;  Location: [facility] ORS;  Service: Urology;  Laterality: Right;   EYE SURGERY  2017?   3 injections to left eye for macular degeneration    FMT  06/19/2014   LAPAROSCOPIC LOW ANTERIOR RESECTION N/A 04/19/2015   Procedure: LAPAROSCOPIC ASSISTED  LOW ANTERIOR RESECTION, splenic flexure mobilization;  Surgeon: Elon Pacini, MD;  Location: [facility] ORS;  Service: General;  Laterality: N/A;   LITHOTRIPSY     TONSILLECTOMY     Patient Active Problem List   Diagnosis Date Noted   Hyperlipidemia 01/19/2020   Palpitations 07/17/2016   Abdominal pain 05/31/2016   Small bowel obstruction (HCC) 05/31/2016   Hypokalemia 05/31/2016   Aortic regurgitation    Hypertensive heart disease    Hypertension    Orthostasis    Diverticulitis large intestine 04/19/2015   Chest pain 03/31/2015  Pre-operative clearance 03/31/2015   Diverticulitis of colon - recurrent/persistent 12/29/2014   LLQ pain 11/26/2014   History of Clostridium difficile 12/17/2013   History of diverticulitis 12/17/2013   History of Clostridium difficile infection 12/17/2013   Hx of pseudomembranous enterocolitis 04/17/2013   Abdominal pain, left lower quadrant 04/13/2013   Glaucoma     PCP: Butler Burr   REFERRING PROVIDER: Butler Burr   REFERRING DIAG: Inguinal pain   THERAPY DIAG:  Pain in right hip  Rationale for Evaluation and Treatment: Rehabilitation  ONSET DATE:    SUBJECTIVE:   SUBJECTIVE STATEMENT Pt states improvement in pain since last visit. He notes mild soreness with certain motions, but getting better.   Eval: Pt states onset of Right Inguinal  Pain in late March, no incident to report. He did have pain more in muscle of high thigh/quad, but now pain more along groin line. He was told he does have small hernia, but MD does not think it is causing his pain.  States Pain with initial standing, pain variable, some pain with lifting leg.   No pain with Walking longer distances or Stairs, no pain with getting in car.   Has bike and weights at home.  Would like to return to pickelball, golf, etc.  Walks: 1.5 mi/  40 min/ a few days/week    PERTINENT HISTORY: CKD, kidney sones, HTN,Skin CA, heart palpitations    PAIN:  Are you having pain? Yes: NPRS scale: 1-2 /10 Pain location: R hip/groin Pain description: sore Aggravating factors: lifting leg, initial standing/transfers Relieving factors: none stated   PRECAUTIONS: None  WEIGHT BEARING RESTRICTIONS: No  FALLS:  Has patient fallen in last 6 months? No   PLOF: Independent  PATIENT GOALS:  Decreased pain in groin  NEXT MD VISIT:   OBJECTIVE:   DIAGNOSTIC FINDINGS:   PATIENT SURVEYS:    COGNITION: Overall cognitive status: Within functional limits for tasks assessed     SENSATION: WFL  EDEMA:    POSTURE:    No Significant postural limitations  PALPATION:  Tenderness along R groin line, increased towards pubic symphysis,  mild pain along adductor insertions.    LOWER EXTREMITY ROM: Knee: WFL,  R hip: WFL- mild pain with ER- pull of adductors.   LOWER EXTREMITY MMT:  MMT Left eval Right  eval  Hip flexion  3/pain  Hip extension    Hip abduction  4  Hip adduction    Hip internal rotation    Hip external rotation    Knee flexion  5  Knee extension  5  Ankle dorsiflexion    Ankle plantarflexion    Ankle inversion    Ankle eversion     (Blank rows = not tested)  LOWER EXTREMITY SPECIAL TESTS:  Increased pain with SLR,  no pain with supine march   GAIT: unremarkable   TODAY'S TREATMENT:  DATE:   10/10/2023 Therapeutic Exercise: Aerobic: Supine:    LTR x 10;  SKTC 30 sec x 3 on R;  ball squeeze with TA x 15;   Hip abd glams GTB with TA x 20;  TA with breathing x 10;  Prom for hip abd and ER;  Seated:  sit to stand x 10  S/L: hip abd/clams x 15 on R;  Standing:  Marching x 20 - no pain ; hip add stretch x 10 at counter  Stretches:  Neuromuscular Re-education: Manual Therapy:   Therapeutic Activity: Self Care:   Therapeutic Exercise: Aerobic: Supine:  hip ER fallouts x 15;  LTR x 10;  ball squeeze x 15;  march with TA x 15 (no pain);  (Pain with SLR test)  Seated:  LAQ x 15 on R;  Standing: Stretches:  Neuromuscular Re-education: Manual Therapy:  STM to R adductors at groin line, manual and with roller  , LAD for R hip, manual hip add stretch Therapeutic Activity: Self Care:    PATIENT EDUCATION:  Education details: updated and reviewed HEP Person educated: Patient Education method: Explanation, Demonstration, Tactile cues, Verbal cues, and Handouts Education comprehension: verbalized understanding, returned demonstration, verbal cues required, tactile cues required, and needs further education   HOME EXERCISE PROGRAM: Access Code: [email or web address] URL: [email or web address] Date: 09/13/2023 Prepared by: Tinnie Don  Exercises - Bent Knee Fallouts  - 1 x daily - 1 sets - 10-15 reps - 3-5 sec  hold - Side Lunge Adductor Stretch  - 1 x daily - 1 sets - 10 reps - 5 sec  hold    ASSESSMENT:  CLINICAL IMPRESSION: No pain with most activities today ,but does continue to have pain with attempts for SLR . Improved pain with standing march and lower pain in general in last couple days. Reviewed and updated HEP.   Eval: Patient presents with primary complaint of  pain in R hip. He has most pain along groin line with palpation, as well as with attempt for SLR. He has good hip  range of motion without stiffness. He has increased pain in hip flexor and adductor musculature, and increased pain with activity. He was given initial mobility to perform with HEP, will continue to monitor pain in future sessions for determining source of pain, muscle strain vs hernia. Pt with decreased ability for full functional activities. Pt will  benefit from skilled PT to improve deficits and pain and to return to PLOF.   OBJECTIVE IMPAIRMENTS: decreased activity tolerance, decreased ROM, decreased strength, increased muscle spasms, and pain.   ACTIVITY LIMITATIONS: bending, sitting, standing, squatting, transfers, and locomotion level  PARTICIPATION LIMITATIONS: cleaning, driving, shopping, and community activity  PERSONAL FACTORS: Time since onset of injury/illness/exacerbation are also affecting patient's functional outcome.   REHAB POTENTIAL: Good  CLINICAL DECISION MAKING: Stable/uncomplicated  EVALUATION COMPLEXITY: Low   GOALS: Goals reviewed with patient? Yes  SHORT TERM GOALS: Target date: 10/04/2023   Pt to be independent with initial HEP  Goal status: INITIAL   LONG TERM GOALS: Target date: 11/22/2023   Pt to be independent with final HEP  Goal status: INITIAL  2.  Pt to demo ability for hip rom in all directions without pain , to improve ability for IADLs.   Goal status: INITIAL  3.  Pt to demo ability to lift R Leg against gravity without pain, strength at least 4/5  Goal status: INITIAL  4.  Pt to report at least 75 % improvement in overall symptoms.  Goal status: INITIAL   PLAN:  PT FREQUENCY: 1-2x/week  PT DURATION: 10 weeks  PLANNED INTERVENTIONS: Therapeutic exercises, Therapeutic activity, Neuromuscular re-education, Patient/Family education, Self Care, Joint mobilization, Joint manipulation, Stair training, Orthotic/Fit training, DME instructions, Aquatic Therapy, Dry Needling, Electrical stimulation, Cryotherapy, Moist heat, Taping,  Ultrasound, Ionotophoresis 4mg /ml Dexamethasone , Manual therapy,  Vasopneumatic device, Traction, Spinal manipulation, Spinal mobilization,Balance training, Gait training,   PLAN FOR NEXT SESSION:    Tinnie Don, PT, DPT 1:53 PM  10/10/23

## 2023-10-16 ENCOUNTER — Ambulatory Visit: Admitting: Sports Medicine

## 2023-10-18 ENCOUNTER — Ambulatory Visit: Admitting: Physical Therapy

## 2023-10-18 ENCOUNTER — Encounter: Payer: Self-pay | Admitting: Physical Therapy

## 2023-10-18 DIAGNOSIS — M25551 Pain in right hip: Secondary | ICD-10-CM | POA: Diagnosis not present

## 2023-10-18 NOTE — Therapy (Signed)
 OUTPATIENT PHYSICAL THERAPY LOWER EXTREMITY TREATMENT   Patient Name: Joel Bauer MRN: [id number] DOB:[birth year], 89 y.o., male Today's Date: 10/18/2023  END OF SESSION:  PT End of Session - 10/18/23 1353     Visit Number 5    Number of Visits 12    Date for PT Re-Evaluation 11/22/23    Authorization Type BCBS Medicare    PT Start Time 1352    PT Stop Time 1430    PT Time Calculation (min) 38 min    Activity Tolerance Patient tolerated treatment well    Behavior During Therapy [facility] for tasks assessed/performed            Past Medical History:  Diagnosis Date   Adenomatous colon polyp 2001   Allergy    hay fever type   Anemia    as a child    Aortic regurgitation    a. mild by echo 03/2015   Arthritis    mild condition   Cancer (HCC)    hx of skin cancer    Chest pain 03/31/2015   Chronic kidney disease 2018   kidney stones removed twice   Depression    Diverticulitis large intestine 04/19/2015   Diverticulitis of colon - recurrent/persistent 12/29/2014   Diverticulosis of colon (without mention of hemorrhage) 2005   GERD (gastroesophageal reflux disease) long time   take medication.   Heart murmur    History of kidney stones    History of skin cancer    Hyperlipidemia    Hypertension several years   never seriously high   Hypertensive heart disease    LLQ pain 11/26/2014   Macular degeneration    left eye   Orthostasis    Pleurisy    Premature atrial contractions    Prostatitis    PVC's (premature ventricular contractions)    Recurrent colitis due to Clostridium difficile    Retinal hemorrhage    SCC (squamous cell carcinoma) 11/13/2018   RIGHT FOREARM CX3 5FU   Shingles    Small bowel obstruction (HCC) 05/31/2016   Squamous cell carcinoma of skin 07/23/2017   SCC IN SITU LEFT UPPER NOSE BRIDGE CX3 5FU CAUTERY   Past Surgical History:  Procedure Laterality Date   CATARACTS REMOVED     COLON SURGERY  2019+/_   COLONOSCOPY     COLONOSCOPY  N/A 06/15/2014   Procedure: COLONOSCOPY;  Surgeon: [name], MD;  Location: [facility] ENDOSCOPY;  Service: Endoscopy;  Laterality: N/A;   CYSTOSCOPY WITH RETROGRADE PYELOGRAM, URETEROSCOPY AND STENT PLACEMENT Right 10/18/2013   Procedure: CYSTOSCOPY WITH RIGHT RETROGRADE/RIGHT URETEROSCOPY, STONE BASKETRY,  AND STENT PLACEMENT RIGHT;  Surgeon: Ricardo Likens, MD;  Location: [facility] ORS;  Service: Urology;  Laterality: Right;   EYE SURGERY  2017?   3 injections to left eye for macular degeneration    FMT  06/19/2014   LAPAROSCOPIC LOW ANTERIOR RESECTION N/A 04/19/2015   Procedure: LAPAROSCOPIC ASSISTED  LOW ANTERIOR RESECTION, splenic flexure mobilization;  Surgeon: Elon Pacini, MD;  Location: [facility] ORS;  Service: General;  Laterality: N/A;   LITHOTRIPSY     TONSILLECTOMY     Patient Active Problem List   Diagnosis Date Noted   Hyperlipidemia 01/19/2020   Palpitations 07/17/2016   Abdominal pain 05/31/2016   Small bowel obstruction (HCC) 05/31/2016   Hypokalemia 05/31/2016   Aortic regurgitation    Hypertensive heart disease    Hypertension    Orthostasis    Diverticulitis large intestine 04/19/2015   Chest pain 03/31/2015  Pre-operative clearance 03/31/2015   Diverticulitis of colon - recurrent/persistent 12/29/2014   LLQ pain 11/26/2014   History of Clostridium difficile 12/17/2013   History of diverticulitis 12/17/2013   History of Clostridium difficile infection 12/17/2013   Hx of pseudomembranous enterocolitis 04/17/2013   Abdominal pain, left lower quadrant 04/13/2013   Glaucoma     PCP: Butler Burr   REFERRING PROVIDER: Butler Burr   REFERRING DIAG: Inguinal pain   THERAPY DIAG:  Pain in right hip  Rationale for Evaluation and Treatment: Rehabilitation  ONSET DATE:    SUBJECTIVE:   SUBJECTIVE STATEMENT Pt states improvement in pain . He notes mild soreness with certain motions, but getting better. He did cancel MD appt because he is improving.   Eval: Pt  states onset of Right Inguinal Pain in late March, no incident to report. He did have pain more in muscle of high thigh/quad, but now pain more along groin line. He was told he does have small hernia, but MD does not think it is causing his pain.  States Pain with initial standing, pain variable, some pain with lifting leg.   No pain with Walking longer distances or Stairs, no pain with getting in car.   Has bike and weights at home.  Would like to return to pickelball, golf, etc.  Walks: 1.5 mi/  40 min/ a few days/week    PERTINENT HISTORY: CKD, kidney sones, HTN,Skin CA, heart palpitations    PAIN:  Are you having pain? Yes: NPRS scale: 1-2 /10 Pain location: R hip/groin Pain description: sore Aggravating factors: lifting leg, initial standing/transfers Relieving factors: none stated   PRECAUTIONS: None  WEIGHT BEARING RESTRICTIONS: No  FALLS:  Has patient fallen in last 6 months? No   PLOF: Independent  PATIENT GOALS:  Decreased pain in groin  NEXT MD VISIT:   OBJECTIVE:   DIAGNOSTIC FINDINGS:   PATIENT SURVEYS:    COGNITION: Overall cognitive status: Within functional limits for tasks assessed     SENSATION: WFL  EDEMA:    POSTURE:    No Significant postural limitations  PALPATION:  Tenderness along R groin line, increased towards pubic symphysis,  mild pain along adductor insertions.    LOWER EXTREMITY ROM: Knee: WFL,  R hip: WFL- mild pain with ER- pull of adductors.   LOWER EXTREMITY MMT:  MMT Left eval Right  eval Right  10/18/23  Hip flexion  3/pain 4- / mild soreness   Hip extension     Hip abduction  4   Hip adduction     Hip internal rotation     Hip external rotation     Knee flexion  5   Knee extension  5   Ankle dorsiflexion     Ankle plantarflexion     Ankle inversion     Ankle eversion      (Blank rows = not tested)  LOWER EXTREMITY SPECIAL TESTS:  Mild Increased pain with SLR,  no pain with supine  march   GAIT: unremarkable   TODAY'S TREATMENT:  DATE:   10/18/2023 Therapeutic Exercise: Aerobic: Supine:  SKTC 30 sec x 3 on R;  ball squeeze with TA x 15;   Hip abd glams GTB with TA x 20;   hip ER rom x 15- some soreness that improves with more repetitions.  Hip flexor stretch-off side of table x 3 on R;  Prom for hip abd and ER;  Seated:   S/L:  Standing:  Marching x 20 ; hip abd 2 x 10 bil;   Stretches:  Neuromuscular Re-education: Manual Therapy:   Therapeutic Activity: squats at mat table x 15;  step ups 6 in x 10 on R;  Self Care:    Therapeutic Exercise: Aerobic: Supine:    LTR x 10;  SKTC 30 sec x 3 on R;  ball squeeze with TA x 15;   Hip abd glams GTB with TA x 20;  TA with breathing x 10;  Prom for hip abd and ER;  Seated:  sit to stand x 10  S/L: hip abd/clams x 15 on R;  Standing:  Marching x 20 - no pain ; hip add stretch x 10 at counter  Stretches:  Neuromuscular Re-education: Manual Therapy:   Therapeutic Activity: Self Care:   Therapeutic Exercise: Aerobic: Supine:  hip ER fallouts x 15;  LTR x 10;  ball squeeze x 15;  march with TA x 15 (no pain);  (Pain with SLR test)  Seated:  LAQ x 15 on R;  Standing: Stretches:  Neuromuscular Re-education: Manual Therapy:  STM to R adductors at groin line, manual and with roller  , LAD for R hip, manual hip add stretch Therapeutic Activity: Self Care:    PATIENT EDUCATION:  Education details: updated and reviewed HEP Person educated: Patient Education method: Explanation, Demonstration, Tactile cues, Verbal cues, and Handouts Education comprehension: verbalized understanding, returned demonstration, verbal cues required, tactile cues required, and needs further education   HOME EXERCISE PROGRAM: Access Code: [email or web address] URL: [email or web address] Date:  09/13/2023 Prepared by: Tinnie Don  Exercises - Bent Knee Fallouts  - 1 x daily - 1 sets - 10-15 reps - 3-5 sec  hold - Side Lunge Adductor Stretch  - 1 x daily - 1 sets - 10 reps - 5 sec  hold    ASSESSMENT:  CLINICAL IMPRESSION: Pt with improving pain levels. He feels 90 % improved. He still feeling pain with SLR today, although able to perform (previously much more pain with this). He has good ability for step up and SLS without pain today. Pt to benefit from continued care, at 1x/wk for continuing to decrease pain in R side of groin.   Eval: Patient presents with primary complaint of  pain in R hip. He has most pain along groin line with palpation, as well as with attempt for SLR. He has good hip range of motion without stiffness. He has increased pain in hip flexor and adductor musculature, and increased pain with activity. He was given initial mobility to perform with HEP, will continue to monitor pain in future sessions for determining source of pain, muscle strain vs hernia. Pt with decreased ability for full functional activities. Pt will  benefit from skilled PT to improve deficits and pain and to return to PLOF.   OBJECTIVE IMPAIRMENTS: decreased activity tolerance, decreased ROM, decreased strength, increased muscle spasms, and pain.   ACTIVITY LIMITATIONS: bending, sitting, standing, squatting, transfers, and locomotion level  PARTICIPATION LIMITATIONS: cleaning, driving, shopping, and community activity  PERSONAL FACTORS: Time since onset of  injury/illness/exacerbation are also affecting patient's functional outcome.   REHAB POTENTIAL: Good  CLINICAL DECISION MAKING: Stable/uncomplicated  EVALUATION COMPLEXITY: Low   GOALS: Goals reviewed with patient? Yes  SHORT TERM GOALS: Target date: 10/04/2023   Pt to be independent with initial HEP  Goal status: INITIAL   LONG TERM GOALS: Target date: 11/22/2023   Pt to be independent with final HEP  Goal status:  INITIAL  2.  Pt to demo ability for hip rom in all directions without pain , to improve ability for IADLs.   Goal status: INITIAL  3.  Pt to demo ability to lift R Leg against gravity without pain, strength at least 4/5  Goal status: INITIAL  4.  Pt to report at least 75 % improvement in overall symptoms.   Goal status: INITIAL   PLAN:  PT FREQUENCY: 1-2x/week  PT DURATION: 10 weeks  PLANNED INTERVENTIONS: Therapeutic exercises, Therapeutic activity, Neuromuscular re-education, Patient/Family education, Self Care, Joint mobilization, Joint manipulation, Stair training, Orthotic/Fit training, DME instructions, Aquatic Therapy, Dry Needling, Electrical stimulation, Cryotherapy, Moist heat, Taping, Ultrasound, Ionotophoresis 4mg /ml Dexamethasone , Manual therapy,  Vasopneumatic device, Traction, Spinal manipulation, Spinal mobilization,Balance training, Gait training,   PLAN FOR NEXT SESSION:    Tinnie Don, PT, DPT 1:54 PM  10/18/23

## 2023-10-19 ENCOUNTER — Ambulatory Visit: Admitting: Sports Medicine

## 2023-10-19 DIAGNOSIS — H938X1 Other specified disorders of right ear: Secondary | ICD-10-CM | POA: Diagnosis not present

## 2023-10-19 DIAGNOSIS — H6121 Impacted cerumen, right ear: Secondary | ICD-10-CM | POA: Diagnosis not present

## 2023-10-19 DIAGNOSIS — H60501 Unspecified acute noninfective otitis externa, right ear: Secondary | ICD-10-CM | POA: Diagnosis not present

## 2023-10-30 ENCOUNTER — Encounter: Payer: Self-pay | Admitting: Physical Therapy

## 2023-10-30 ENCOUNTER — Ambulatory Visit: Admitting: Physical Therapy

## 2023-10-30 DIAGNOSIS — M25551 Pain in right hip: Secondary | ICD-10-CM | POA: Diagnosis not present

## 2023-10-30 NOTE — Therapy (Signed)
 OUTPATIENT PHYSICAL THERAPY LOWER EXTREMITY TREATMENT   Patient Name: Joel Bauer MRN: [id number] DOB:1933-09-10, 89 y.o., male Today's Date: 10/30/2023  END OF SESSION:  PT End of Session - 10/30/23 1108     Visit Number 6    Number of Visits 12    Date for PT Re-Evaluation 11/22/23    Authorization Type BCBS Medicare    PT Start Time 1102    PT Stop Time 1140    PT Time Calculation (min) 38 min    Activity Tolerance Patient tolerated treatment well    Behavior During Therapy WFL for tasks assessed/performed             Past Medical History:  Diagnosis Date   Adenomatous colon polyp 2001   Allergy    hay fever type   Anemia    as a child    Aortic regurgitation    a. mild by echo 03/2015   Arthritis    mild condition   Cancer (HCC)    hx of skin cancer    Chest pain 03/31/2015   Chronic kidney disease 2018   kidney stones removed twice   Depression    Diverticulitis large intestine 04/19/2015   Diverticulitis of colon - recurrent/persistent 12/29/2014   Diverticulosis of colon (without mention of hemorrhage) 2005   GERD (gastroesophageal reflux disease) long time   take medication.   Heart murmur    History of kidney stones    History of skin cancer    Hyperlipidemia    Hypertension several years   never seriously high   Hypertensive heart disease    LLQ pain 11/26/2014   Macular degeneration    left eye   Orthostasis    Pleurisy    Premature atrial contractions    Prostatitis    PVC's (premature ventricular contractions)    Recurrent colitis due to Clostridium difficile    Retinal hemorrhage    SCC (squamous cell carcinoma) 11/13/2018   RIGHT FOREARM CX3 5FU   Shingles    Small bowel obstruction (HCC) 05/31/2016   Squamous cell carcinoma of skin 07/23/2017   SCC IN SITU LEFT UPPER NOSE BRIDGE CX3 5FU CAUTERY   Past Surgical History:  Procedure Laterality Date   CATARACTS REMOVED     COLON SURGERY  2019+/_   COLONOSCOPY      COLONOSCOPY N/A 06/15/2014   Procedure: COLONOSCOPY;  Surgeon: Joel FORBES Commander, MD;  Location: [facility] ENDOSCOPY;  Service: Endoscopy;  Laterality: N/A;   CYSTOSCOPY WITH RETROGRADE PYELOGRAM, URETEROSCOPY AND STENT PLACEMENT Right 10/18/2013   Procedure: CYSTOSCOPY WITH RIGHT RETROGRADE/RIGHT URETEROSCOPY, STONE BASKETRY,  AND STENT PLACEMENT RIGHT;  Surgeon: Ricardo Likens, MD;  Location: [facility];  Service: Urology;  Laterality: Right;   EYE SURGERY  2017?   3 injections to left eye for macular degeneration    FMT  06/19/2014   LAPAROSCOPIC LOW ANTERIOR RESECTION N/A 04/19/2015   Procedure: LAPAROSCOPIC ASSISTED  LOW ANTERIOR RESECTION, splenic flexure mobilization;  Surgeon: Elon Pacini, MD;  Location: [facility] OR[facility];  Service: General;  Laterality: N/A;   LITHOTRIPSY     TONSILLECTOMY     Patient Active Problem List   Diagnosis Date Noted   Hyperlipidemia 01/19/2020   Palpitations 07/17/2016   Abdominal pain 05/31/2016   Small bowel obstruction (HCC) 05/31/2016   Hypokalemia 05/31/2016   Aortic regurgitation    Hypertensive heart disease    Hypertension    Orthostasis    Diverticulitis large intestine 04/19/2015   Chest pain 03/31/2015  Pre-operative clearance 03/31/2015   Diverticulitis of colon - recurrent/persistent 12/29/2014   LLQ pain 11/26/2014   History of Clostridium difficile 12/17/2013   History of diverticulitis 12/17/2013   History of Clostridium difficile infection 12/17/2013   Hx of pseudomembranous enterocolitis 04/17/2013   Abdominal pain, left lower quadrant 04/13/2013   Glaucoma     PCP: Joel Bauer   REFERRING PROVIDER: Butler Bauer   REFERRING DIAG: Inguinal pain   THERAPY DIAG:  Pain in right hip  Rationale for Evaluation and Treatment: Rehabilitation  ONSET DATE:    SUBJECTIVE:   SUBJECTIVE STATEMENT Pt was doing very well at last visit. He hit a bucket of golf balls, and had significant pain following this. He has continued to have pain,  slightly lessened , but still bothersome. Most pain with trying to lift leg (hip flexion or SLR motion)   Eval: Pt states onset of Right Inguinal Pain in late March, no incident to report. He did have pain more in muscle of high thigh/quad, but now pain more along groin line. He was told he does have small hernia, but MD does not think it is causing his pain.  States Pain with initial standing, pain variable, some pain with lifting leg.   No pain with Walking longer distances or Stairs, no pain with getting in car.   Has bike and weights at home.  Would like to return to pickelball, golf, etc.  Walks: 1.5 mi/  40 min/ a few days/week    PERTINENT HISTORY: CKD, kidney sones, HTN,Skin CA, heart palpitations    PAIN:  Are you having pain? Yes: NPRS scale: 3-4 /10  Pain location: R hip/groin Pain description: sore Aggravating factors: lifting leg, initial standing/transfers Relieving factors: none stated   PRECAUTIONS: None  WEIGHT BEARING RESTRICTIONS: No  FALLS:  Has patient fallen in last 6 months? No   PLOF: Independent  PATIENT GOALS:  Decreased pain in groin  NEXT MD VISIT:   OBJECTIVE:   DIAGNOSTIC FINDINGS:   PATIENT SURVEYS:    COGNITION: Overall cognitive status: Within functional limits for tasks assessed     SENSATION: WFL  EDEMA:    POSTURE:    No Significant postural limitations  PALPATION:  Tenderness along R groin line, increased towards pubic symphysis,  mild pain along adductor insertions.    LOWER EXTREMITY ROM: Knee: WFL,  R hip: WFL- mild pain with ER- pull of adductors.   LOWER EXTREMITY MMT:  MMT Left eval Right  eval Right  10/18/23  Hip flexion  3/pain 4- / mild soreness   Hip extension     Hip abduction  4   Hip adduction     Hip internal rotation     Hip external rotation     Knee flexion  5   Knee extension  5   Ankle dorsiflexion     Ankle plantarflexion     Ankle inversion     Ankle eversion      (Blank rows = not  tested)  LOWER EXTREMITY SPECIAL TESTS:  Mild Increased pain with SLR,  no pain with supine march   GAIT: unremarkable   TODAY'S TREATMENT:  DATE:   10/30/2023 Therapeutic Exercise: Aerobic: Supine:   ball squeeze with TA x 20;   Hip abd glams GTB with TA x 20;   hip ER rom x 10- pain  Prom for hip abd and ER;  Supine march x 15 with TA;   Seated:   S/L:  Standing:    hip abd 2 x 10 bil;   Stretches:  Neuromuscular Re-education: Manual Therapy:   Therapeutic Activity:  sit to stand x 12  ;    step ups 6 in x 10 on R;  Self Care:    Therapeutic Exercise: Aerobic: Supine:    LTR x 10;  SKTC 30 sec x 3 on R;  ball squeeze with TA x 15;   Hip abd glams GTB with TA x 20;  TA with breathing x 10;  Prom for hip abd and ER;  Seated:  sit to stand x 10  S/L: hip abd/clams x 15 on R;  Standing:  Marching x 20 - no pain ; hip add stretch x 10 at counter  Stretches:  Neuromuscular Re-education: Manual Therapy:   Therapeutic Activity: Self Care:   Therapeutic Exercise: Aerobic: Supine:  hip ER fallouts x 15;  LTR x 10;  ball squeeze x 15;  march with TA x 15 (no pain);  (Pain with SLR test)  Seated:  LAQ x 15 on R;  Standing: Stretches:  Neuromuscular Re-education: Manual Therapy:  STM to R adductors at groin line, manual and with roller  , LAD for R hip, manual hip add stretch Therapeutic Activity: Self Care:    PATIENT EDUCATION:  Education details: updated and reviewed HEP Person educated: Patient Education method: Explanation, Demonstration, Tactile cues, Verbal cues, and Handouts Education comprehension: verbalized understanding, returned demonstration, verbal cues required, tactile cues required, and needs further education   HOME EXERCISE PROGRAM: Access Code: [email or web address] URL: [email or web address] Date:  09/13/2023 Prepared by: Tinnie Don  Exercises - Bent Knee Fallouts  - 1 x daily - 1 sets - 10-15 reps - 3-5 sec  hold - Side Lunge Adductor Stretch  - 1 x daily - 1 sets - 10 reps - 5 sec  hold    ASSESSMENT:  CLINICAL IMPRESSION: Pt with increased pain after going to driving range. He has most pain at inguinal line, with palpation and hip flexion/SLR motion. He has no pain with stretch or activation of adductors today. Discussed getting appt with sports med for closer look at pain that has returned. Reviewed motions to continue at this time.    Eval: Patient presents with primary complaint of  pain in R hip. He has most pain along groin line with palpation, as well as with attempt for SLR. He has good hip range of motion without stiffness. He has increased pain in hip flexor and adductor musculature, and increased pain with activity. He was given initial mobility to perform with HEP, will continue to monitor pain in future sessions for determining source of pain, muscle strain vs hernia. Pt with decreased ability for full functional activities. Pt will  benefit from skilled PT to improve deficits and pain and to return to PLOF.   OBJECTIVE IMPAIRMENTS: decreased activity tolerance, decreased ROM, decreased strength, increased muscle spasms, and pain.   ACTIVITY LIMITATIONS: bending, sitting, standing, squatting, transfers, and locomotion level  PARTICIPATION LIMITATIONS: cleaning, driving, shopping, and community activity  PERSONAL FACTORS: Time since onset of injury/illness/exacerbation are also affecting patient's functional outcome.   REHAB POTENTIAL: Good  CLINICAL DECISION MAKING:  Stable/uncomplicated  EVALUATION COMPLEXITY: Low   GOALS: Goals reviewed with patient? Yes  SHORT TERM GOALS: Target date: 10/04/2023   Pt to be independent with initial HEP  Goal status: IMET   LONG TERM GOALS: Target date: 11/22/2023   Pt to be independent with final HEP  Goal status:  In progress   2.  Pt to demo ability for hip rom in all directions without pain , to improve ability for IADLs.   Goal status: In progress   3.  Pt to demo ability to lift R Leg against gravity without pain, strength at least 4/5  Goal status: In progress   4.  Pt to report at least 75 % improvement in overall symptoms.   Goal status: In progress    PLAN:  PT FREQUENCY: 1-2x/week  PT DURATION: 10 weeks  PLANNED INTERVENTIONS: Therapeutic exercises, Therapeutic activity, Neuromuscular re-education, Patient/Family education, Self Care, Joint mobilization, Joint manipulation, Stair training, Orthotic/Fit training, DME instructions, Aquatic Therapy, Dry Needling, Electrical stimulation, Cryotherapy, Moist heat, Taping, Ultrasound, Ionotophoresis 4mg /ml Dexamethasone , Manual therapy,  Vasopneumatic device, Traction, Spinal manipulation, Spinal mobilization,Balance training, Gait training,   PLAN FOR NEXT SESSION:    Tinnie Don, PT, DPT 11:08 AM  10/30/23

## 2023-11-02 NOTE — Progress Notes (Unsigned)
    Joel [facility] [address] [place] [address] Phone: [phone]   Assessment and Plan:     There are no diagnoses linked to this encounter.  ***   Pertinent previous records reviewed include ***    Follow Up: ***     Subjective:   I, Joel Bauer, am serving as a Neurosurgeon for Doctor Morene Mace  Chief Complaint: right foot and groin pain   HPI:   11/06/2023 Patient is a 89 year old male with right foot and groin pain. Patient states  Relevant Historical Information: ***  Additional pertinent review of systems negative.   Current Outpatient Medications:    amLODipine  (NORVASC ) 10 MG tablet, Take 1 tablet (10 mg total) by mouth daily., Disp: 90 tablet, Rfl: 2   fluticasone  (FLONASE ) 50 MCG/ACT nasal spray, Place 1 spray into both nostrils daily as needed for allergies or rhinitis., Disp: , Rfl:    hydroxypropyl methylcellulose / hypromellose (ISOPTO TEARS / GONIOVISC) 2.5 % ophthalmic solution, Place 1 drop into both eyes 3 (three) times daily as needed for dry eyes., Disp: , Rfl:    metoprolol  succinate (TOPROL -XL) 25 MG 24 hr tablet, TAKE 1 TABLET BY MOUTH EVERY DAY, Disp: 90 tablet, Rfl: 0   mometasone  (ELOCON ) 0.1 % cream, Apply topically in the morning and at bedtime. (Patient not taking: Reported on 09/05/2023), Disp: 15 g, Rfl: 2   multivitamin-lutein (OCUVITE-LUTEIN) CAPS capsule, Take 1 capsule by mouth daily., Disp: , Rfl:    omeprazole  (PRILOSEC) 40 MG capsule, Take 1 capsule (40 mg total) by mouth daily., Disp: 90 capsule, Rfl: 3   potassium chloride  (KLOR-CON  M) 10 MEQ tablet, TAKE 1 TABLET(10 MEQ) BY MOUTH TWICE DAILY (Patient not taking: Reported on 09/05/2023), Disp: 180 tablet, Rfl: 0   primidone  (MYSOLINE ) 50 MG tablet, TAKE 1 TABLET(50 MG) BY MOUTH THREE TIMES DAILY, Disp: 270 tablet, Rfl: 3   triamterene -hydrochlorothiazide (DYAZIDE) 37.5-25 MG capsule, TAKE 1 CAPSULE BY MOUTH EVERY DAY, Disp: 90  capsule, Rfl: 3   Objective:     There were no vitals filed for this visit.    There is no height or weight on file to calculate BMI.    Physical Exam:    ***   Electronically signed by:  Odis Mace [facility] 12:03 PM 11/02/23

## 2023-11-06 ENCOUNTER — Ambulatory Visit

## 2023-11-06 ENCOUNTER — Ambulatory Visit (INDEPENDENT_AMBULATORY_CARE_PROVIDER_SITE_OTHER): Admitting: Sports Medicine

## 2023-11-06 VITALS — BP 146/78 | HR 66 | Ht 71.0 in | Wt 180.0 lb

## 2023-11-06 DIAGNOSIS — M1611 Unilateral primary osteoarthritis, right hip: Secondary | ICD-10-CM | POA: Diagnosis not present

## 2023-11-06 DIAGNOSIS — G8929 Other chronic pain: Secondary | ICD-10-CM

## 2023-11-06 DIAGNOSIS — M545 Low back pain, unspecified: Secondary | ICD-10-CM | POA: Diagnosis not present

## 2023-11-06 DIAGNOSIS — M25551 Pain in right hip: Secondary | ICD-10-CM

## 2023-11-06 DIAGNOSIS — M47814 Spondylosis without myelopathy or radiculopathy, thoracic region: Secondary | ICD-10-CM | POA: Diagnosis not present

## 2023-11-06 DIAGNOSIS — M48061 Spinal stenosis, lumbar region without neurogenic claudication: Secondary | ICD-10-CM | POA: Diagnosis not present

## 2023-11-06 NOTE — Patient Instructions (Addendum)
 Thank you for coming in today.   - Start meloxicam  15 mg daily x2 weeks.  If still having pain after 2 weeks, complete 3rd-week of NSAID. May use remaining NSAID as needed once daily for pain control.  Do not to use additional over-the-counter NSAIDs (ibuprofen , naproxen, Advil , Aleve, etc.) while taking prescription NSAIDs.  May use Tylenol  [phone] mg 2 to 3 times a day for breakthrough pain.  Can restart golf after 2 weeks, if tolerated  Check back in 4 weeks

## 2023-11-07 ENCOUNTER — Telehealth: Payer: Self-pay | Admitting: Sports Medicine

## 2023-11-07 MED ORDER — MELOXICAM 15 MG PO TABS: 15.0000 mg | ORAL_TABLET | Freq: Every day | ORAL | 0 refills | Status: DC

## 2023-11-07 NOTE — Telephone Encounter (Signed)
Medication has been sent to pharmacy. Patient has been called and notified.

## 2023-11-07 NOTE — Telephone Encounter (Signed)
 Patient called and stated he saw Dr. Leonce yesterday who prescribed a medication and he just left the pharmacy and they have not received anything for him. Please advise.

## 2023-11-11 ENCOUNTER — Other Ambulatory Visit: Payer: Self-pay | Admitting: Family Medicine

## 2023-11-11 DIAGNOSIS — K219 Gastro-esophageal reflux disease without esophagitis: Secondary | ICD-10-CM

## 2023-11-13 NOTE — Telephone Encounter (Signed)
 Requested Prescriptions  Pending Prescriptions Disp Refills   omeprazole  (PRILOSEC) 40 MG capsule [Pharmacy Med Name: OMEPRAZOLE  40MG  CAPSULES] 90 capsule 0    Sig: TAKE 1 CAPSULE(40 MG) BY MOUTH DAILY     Gastroenterology: Proton Pump Inhibitors Passed - 11/13/2023 10:57 AM      Passed - Valid encounter within last 12 months    Recent Outpatient Visits           2 months ago Contact dermatitis due to adhesives, unspecified contact dermatitis type   [facility] Medicine [name], MD   2 months ago Inguinal strain, right, subsequent encounter   [facility] [name], MD   5 months ago Inguinal strain, right, initial encounter   [name] [name], MD   7 months ago Benign essential HTN   [facility] [name], [name], MD   1 year ago Benign essential HTN   [facility] [name], [name], MD       Future Appointments             In 3 weeks Joel Katz, DO [facility] at [facility]

## 2023-11-16 ENCOUNTER — Ambulatory Visit: Admitting: Physical Therapy

## 2023-11-16 ENCOUNTER — Encounter: Payer: Self-pay | Admitting: Physical Therapy

## 2023-11-16 DIAGNOSIS — M25551 Pain in right hip: Secondary | ICD-10-CM | POA: Diagnosis not present

## 2023-11-16 NOTE — Therapy (Signed)
 OUTPATIENT PHYSICAL THERAPY LOWER EXTREMITY TREATMENT   Patient Name: Joel Bauer MRN: [id number] DOB:1933-10-29, 89 y.o., male Today's Date: 11/16/2023  END OF SESSION:  PT End of Session - 11/16/23 1144     Visit Number 7    Number of Visits 12    Date for PT Re-Evaluation 11/22/23    Authorization Type BCBS Medicare    PT Start Time 1145    PT Stop Time 1230    PT Time Calculation (min) 45 min    Activity Tolerance Patient tolerated treatment well    Behavior During Therapy [facility] for tasks assessed/performed             Past Medical History:  Diagnosis Date   Adenomatous colon polyp 2001   Allergy    hay fever type   Anemia    as a child    Aortic regurgitation    a. mild by echo 03/2015   Arthritis    mild condition   Cancer (HCC)    hx of skin cancer    Chest pain 03/31/2015   Chronic kidney disease 2018   kidney stones removed twice   Depression    Diverticulitis large intestine 04/19/2015   Diverticulitis of colon - recurrent/persistent 12/29/2014   Diverticulosis of colon (without mention of hemorrhage) 2005   GERD (gastroesophageal reflux disease) long time   take medication.   Heart murmur    History of kidney stones    History of skin cancer    Hyperlipidemia    Hypertension several years   never seriously high   Hypertensive heart disease    LLQ pain 11/26/2014   Macular degeneration    left eye   Orthostasis    Pleurisy    Premature atrial contractions    Prostatitis    PVC's (premature ventricular contractions)    Recurrent colitis due to Clostridium difficile    Retinal hemorrhage    SCC (squamous cell carcinoma) 11/13/2018   RIGHT FOREARM CX3 5FU   Shingles    Small bowel obstruction (HCC) 05/31/2016   Squamous cell carcinoma of skin 07/23/2017   SCC IN SITU LEFT UPPER NOSE BRIDGE CX3 5FU CAUTERY   Past Surgical History:  Procedure Laterality Date   CATARACTS REMOVED     COLON SURGERY  2019+/_   COLONOSCOPY      COLONOSCOPY N/A 06/15/2014   Procedure: COLONOSCOPY;  Surgeon: [name], MD;  Location: [facility] ENDOSCOPY;  Service: Endoscopy;  Laterality: N/A;   CYSTOSCOPY WITH RETROGRADE PYELOGRAM, URETEROSCOPY AND STENT PLACEMENT Right 10/18/2013   Procedure: CYSTOSCOPY WITH RIGHT RETROGRADE/RIGHT URETEROSCOPY, STONE BASKETRY,  AND STENT PLACEMENT RIGHT;  Surgeon: Ricardo Likens, MD;  Location: [facility] ORS;  Service: Urology;  Laterality: Right;   EYE SURGERY  2017?   3 injections to left eye for macular degeneration    FMT  06/19/2014   LAPAROSCOPIC LOW ANTERIOR RESECTION N/A 04/19/2015   Procedure: LAPAROSCOPIC ASSISTED  LOW ANTERIOR RESECTION, splenic flexure mobilization;  Surgeon: Elon Pacini, MD;  Location: [facility] ORS;  Service: General;  Laterality: N/A;   LITHOTRIPSY     TONSILLECTOMY     Patient Active Problem List   Diagnosis Date Noted   Hyperlipidemia 01/19/2020   Palpitations 07/17/2016   Abdominal pain 05/31/2016   Small bowel obstruction (HCC) 05/31/2016   Hypokalemia 05/31/2016   Aortic regurgitation    Hypertensive heart disease    Hypertension    Orthostasis    Diverticulitis large intestine 04/19/2015   Chest pain 03/31/2015  Pre-operative clearance 03/31/2015   Diverticulitis of colon - recurrent/persistent 12/29/2014   LLQ pain 11/26/2014   History of Clostridium difficile 12/17/2013   History of diverticulitis 12/17/2013   History of Clostridium difficile infection 12/17/2013   Hx of pseudomembranous enterocolitis 04/17/2013   Abdominal pain, left lower quadrant 04/13/2013   Glaucoma     PCP: Butler Burr   REFERRING PROVIDER: Butler Burr   REFERRING DIAG: Inguinal pain   THERAPY DIAG:  Pain in right hip  Rationale for Evaluation and Treatment: Rehabilitation  ONSET DATE:    SUBJECTIVE:   SUBJECTIVE STATEMENT Pt last seen 8/12. He did see sports med, with reports of mild OA in R hip, likely causing symptoms.  Today pt states minimal pain.  Daily,  walking at least 1 mile- no pain, also has been doing HEP.  Has been 2 x to hit some golf balls, without significant increase in pain.  Back only sore when he is doing repeated bending.    Eval: Pt states onset of Right Inguinal Pain in late March, no incident to report. He did have pain more in muscle of high thigh/quad, but now pain more along groin line. He was told he does have small hernia, but MD does not think it is causing his pain.  States Pain with initial standing, pain variable, some pain with lifting leg.   No pain with Walking longer distances or Stairs, no pain with getting in car.   Has bike and weights at home.  Would like to return to pickelball, golf, etc.  Walks: 1.5 mi/  40 min/ a few days/week    PERTINENT HISTORY: CKD, kidney sones, HTN,Skin CA, heart palpitations    PAIN:  Are you having pain? Yes: NPRS scale: 3-4 /10  Pain location: R hip/groin Pain description: sore Aggravating factors: lifting leg, initial standing/transfers Relieving factors: none stated   PRECAUTIONS: None  WEIGHT BEARING RESTRICTIONS: No  FALLS:  Has patient fallen in last 6 months? No   PLOF: Independent  PATIENT GOALS:  Decreased pain in groin  NEXT MD VISIT:   OBJECTIVE:   DIAGNOSTIC FINDINGS:   PATIENT SURVEYS:    COGNITION: Overall cognitive status: Within functional limits for tasks assessed     SENSATION: WFL  EDEMA:    POSTURE:    No Significant postural limitations  PALPATION:  Tenderness along R groin line, increased towards pubic symphysis,  mild pain along adductor insertions.    LOWER EXTREMITY ROM: Knee: WFL,  R hip: WFL- mild pain with ER- pull of adductors.   LOWER EXTREMITY MMT:  MMT Left eval Right  eval Right  10/18/23 Right  Hip flexion  3/pain 4- / mild soreness  4  Hip extension      Hip abduction  4    Hip adduction      Hip internal rotation      Hip external rotation      Knee flexion  5    Knee extension  5    Ankle  dorsiflexion      Ankle plantarflexion      Ankle inversion      Ankle eversion       (Blank rows = not tested)  LOWER EXTREMITY SPECIAL TESTS:    GAIT: unremarkable   TODAY'S TREATMENT:  DATE:   11/16/2023 Therapeutic Exercise: Aerobic: Supine:    Hip abd glams GTB with TA x 20;   hip ER rom x 10; piriformis fig 4/leg cross x 3 bil;  Prom for hip abd and ER;  Seated:   S/L:  Standing:    hip abd 2 x 10 bil;   Stretches:  Neuromuscular Re-education:review of posture, weight shift and R hip rotation with golf swing, pt with good pivot of R foot/leg.  Tandem stance x 30 sec bil;   tandem stance with head turns 2 x 5 bil;   SLS 20 sec x 2 bil;  Slow march for balance x 15;  Manual Therapy:   Therapeutic Activity:   Self Care:    Therapeutic Exercise: Aerobic: Supine:    LTR x 10;  SKTC 30 sec x 3 on R;  ball squeeze with TA x 15;   Hip abd glams GTB with TA x 20;  TA with breathing x 10;  Prom for hip abd and ER;  Seated:  sit to stand x 10  S/L: hip abd/clams x 15 on R;  Standing:  Marching x 20 - no pain ; hip add stretch x 10 at counter  Stretches:  Neuromuscular Re-education: Manual Therapy:   Therapeutic Activity: Self Care:   Therapeutic Exercise: Aerobic: Supine:  hip ER fallouts x 15;  LTR x 10;  ball squeeze x 15;  march with TA x 15 (no pain);  (Pain with SLR test)  Seated:  LAQ x 15 on R;  Standing: Stretches:  Neuromuscular Re-education: Manual Therapy:  STM to R adductors at groin line, manual and with roller  , LAD for R hip, manual hip add stretch Therapeutic Activity: Self Care:    PATIENT EDUCATION:  Education details: updated and reviewed HEP Person educated: Patient Education method: Explanation, Demonstration, Tactile cues, Verbal cues, and Handouts Education comprehension: verbalized understanding, returned  demonstration, verbal cues required, tactile cues required, and needs further education   HOME EXERCISE PROGRAM: Access Code: [email or web address]   ASSESSMENT:  CLINICAL IMPRESSION: Pt with minimal pain at this time. He has had variable pain, with increased pain after last time he tried golfing. Since he has seen sports med as well and had x-ray. He is doing well at this time, with little/no pain. He has good understanding of HEP and continuing hip and lumbar mobility. Goals met and pt ready for d/c at this time, pt in agreement with plan.     Eval: Patient presents with primary complaint of  pain in R hip. He has most pain along groin line with palpation, as well as with attempt for SLR. He has good hip range of motion without stiffness. He has increased pain in hip flexor and adductor musculature, and increased pain with activity. He was given initial mobility to perform with HEP, will continue to monitor pain in future sessions for determining source of pain, muscle strain vs hernia. Pt with decreased ability for full functional activities. Pt will  benefit from skilled PT to improve deficits and pain and to return to PLOF.   OBJECTIVE IMPAIRMENTS: decreased activity tolerance, decreased ROM, decreased strength, increased muscle spasms, and pain.   ACTIVITY LIMITATIONS: bending, sitting, standing, squatting, transfers, and locomotion level  PARTICIPATION LIMITATIONS: cleaning, driving, shopping, and community activity  PERSONAL FACTORS: Time since onset of injury/illness/exacerbation are also affecting patient's functional outcome.   REHAB POTENTIAL: Good  CLINICAL DECISION MAKING: Stable/uncomplicated  EVALUATION COMPLEXITY: Low   GOALS: Goals reviewed with  patient? Yes  SHORT TERM GOALS: Target date: 10/04/2023   Pt to be independent with initial HEP  Goal status: MET   LONG TERM GOALS: Target date: 11/22/2023   Pt to be independent with final HEP  Goal status: MET  2.  Pt to  demo ability for hip rom in all directions without pain , to improve ability for IADLs.   Goal status: MET  3.  Pt to demo ability to lift R Leg against gravity without pain, strength at least 4/5  Goal status: MET  4.  Pt to report at least 75 % improvement in overall symptoms.   Goal status: MET   PLAN:  PT FREQUENCY: 1-2x/week  PT DURATION: 10 weeks  PLANNED INTERVENTIONS: Therapeutic exercises, Therapeutic activity, Neuromuscular re-education, Patient/Family education, Self Care, Joint mobilization, Joint manipulation, Stair training, Orthotic/Fit training, DME instructions, Aquatic Therapy, Dry Needling, Electrical stimulation, Cryotherapy, Moist heat, Taping, Ultrasound, Ionotophoresis 4mg /ml Dexamethasone , Manual therapy,  Vasopneumatic device, Traction, Spinal manipulation, Spinal mobilization,Balance training, Gait training,   PLAN FOR NEXT SESSION:    Tinnie Don, PT, DPT 11:45 AM  11/16/23  PHYSICAL THERAPY DISCHARGE SUMMARY  Visits from Start of Care: 7    Plan: Patient agrees to discharge.  Patient goals were  met. Patient is being discharged due to meeting the stated rehab goals.     Tinnie Don, PT, DPT 1:17 PM  11/16/23

## 2023-11-20 ENCOUNTER — Ambulatory Visit: Payer: Self-pay | Admitting: Sports Medicine

## 2023-11-30 ENCOUNTER — Other Ambulatory Visit: Payer: Self-pay | Admitting: Family Medicine

## 2023-11-30 NOTE — Telephone Encounter (Signed)
 Requested Prescriptions  Refused Prescriptions Disp Refills   amLODipine  (NORVASC ) 10 MG tablet [Pharmacy Med Name: AMLODIPINE  BESYLATE 10MG TABLETS] 90 tablet 2    Sig: TAKE 1 TABLET(10 MG) BY MOUTH DAILY     Cardiovascular: Calcium  Channel Blockers 2 Failed - 11/30/2023  3:30 PM      Failed - Last BP in normal range    BP Readings from Last 1 Encounters:  11/06/23 (!) 146/78         Passed - Last Heart Rate in normal range    Pulse Readings from Last 1 Encounters:  11/06/23 66         Passed - Valid encounter within last 6 months    Recent Outpatient Visits           2 months ago Contact dermatitis due to adhesives, unspecified contact dermatitis type   [facility] [name], MD   3 months ago Inguinal strain, right, subsequent encounter   [facility] [facility] [name], MD   5 months ago Inguinal strain, right, initial encounter   [facility] [facility] [name], MD   7 months ago Benign essential HTN   [facility] [name], [name], MD   1 year ago Benign essential HTN   [place] [name], [name], MD       Future Appointments             In 6 days Leonce Katz, DO [facility] at [facility]

## 2023-12-05 ENCOUNTER — Other Ambulatory Visit: Payer: Self-pay | Admitting: Sports Medicine

## 2023-12-05 NOTE — Progress Notes (Unsigned)
 Joel Jackson D.CLEMENTEEN AMYE [facility] [address] [place] [address] Phone: [phone]   Assessment and Plan:     1. Primary osteoarthritis of right hip (Primary) 2. Right hip pain 3. Chronic bilateral low back pain without sciatica -Chronic with exacerbation, subsequent visit - Overall improvement with course of meloxicam , transitioning from physical therapy to home exercises.  Most consistent with improving flare of right hip osteoarthritis.  Suspect degenerative changes in the low back are also mildly contributing to symptoms - Use meloxicam  15 mg daily as needed for pain.  Recommend limiting chronic NSAIDs to 1-2 doses per week to prevent long-term side effects. - Use Tylenol  500 to 1000 mg tablets 2-3 times a day for day-to-day pain relief - Continue home exercise program as tolerated    Pertinent previous records reviewed include none   Follow Up: As needed if no improvement or worsening of symptoms.  Could consider intra-articular hip CSI versus physical therapy   Subjective:   I, Joel Cowin, am serving as a Neurosurgeon for Doctor Morene Mace  Chief Complaint: right foot and groin pain    HPI:    11/06/2023 Patient is a 89 year old male with right foot and groin pain. Patient states he's been working w/ PT and had some improvement, but then returned after playing golf. Pt locates pain to the R anterior hip and into the R groin. He also notes R-sided LBP.   12/06/2023 Patient states he is improved but intermittent flares    Relevant Historical Information: Hypertension,  Additional pertinent review of systems negative.   Current Outpatient Medications:    amLODipine  (NORVASC ) 10 MG tablet, Take 1 tablet (10 mg total) by mouth daily., Disp: 90 tablet, Rfl: 2   fluticasone  (FLONASE ) 50 MCG/ACT nasal spray, Place 1 spray into both nostrils daily as needed for allergies or rhinitis., Disp: , Rfl:    hydroxypropyl methylcellulose /  hypromellose (ISOPTO TEARS / GONIOVISC) 2.5 % ophthalmic solution, Place 1 drop into both eyes 3 (three) times daily as needed for dry eyes., Disp: , Rfl:    meloxicam  (MOBIC ) 15 MG tablet, Take 1 tablet (15 mg total) by mouth daily., Disp: 30 tablet, Rfl: 0   metoprolol  succinate (TOPROL -XL) 25 MG 24 hr tablet, TAKE 1 TABLET BY MOUTH EVERY DAY, Disp: 90 tablet, Rfl: 0   multivitamin-lutein (OCUVITE-LUTEIN) CAPS capsule, Take 1 capsule by mouth daily., Disp: , Rfl:    omeprazole  (PRILOSEC) 40 MG capsule, TAKE 1 CAPSULE(40 MG) BY MOUTH DAILY, Disp: 90 capsule, Rfl: 0   primidone  (MYSOLINE ) 50 MG tablet, TAKE 1 TABLET(50 MG) BY MOUTH THREE TIMES DAILY, Disp: 270 tablet, Rfl: 3   triamterene -hydrochlorothiazide (DYAZIDE) 37.5-25 MG capsule, TAKE 1 CAPSULE BY MOUTH EVERY DAY, Disp: 90 capsule, Rfl: 3   Objective:     Vitals:   12/06/23 1424  Pulse: 70  SpO2: 99%  Weight: 180 lb (81.6 kg)  Height: 5' 11 (1.803 m)      Body mass index is 25.1 kg/m.    Physical Exam:    General: awake, alert, and oriented no acute distress, nontoxic Skin: no suspicious lesions or rashes Neuro:sensation intact distally with no deficits, normal muscle tone, no atrophy, strength 5/5 in all tested lower ext groups Psych: normal mood and affect, speech clear   Right hip: No deformity, swelling or wasting ROM Flexion 90, ext 30, IR 30, ER 35 NTTP over the hip flexors, greater trochanter, gluteal musculature, si joint, lumbar spine Negative  log roll with FROM Positive FABER for groin pain Positive FADIR for groin pain Positive piriformis test for groin pain Negative trendelenberg Gait normal  No pain with resisted hip flexion, abduction, adduction, extension No pain with lumbar flexion, extension, rotation Straight leg raise negative bilaterally    Electronically signed by:  Odis Mace D.CLEMENTEEN AMYE [facility] 2:37 PM 12/06/23

## 2023-12-06 ENCOUNTER — Ambulatory Visit (INDEPENDENT_AMBULATORY_CARE_PROVIDER_SITE_OTHER): Admitting: Sports Medicine

## 2023-12-06 VITALS — HR 70 | Ht 71.0 in | Wt 180.0 lb

## 2023-12-06 DIAGNOSIS — M25551 Pain in right hip: Secondary | ICD-10-CM

## 2023-12-06 DIAGNOSIS — M545 Low back pain, unspecified: Secondary | ICD-10-CM | POA: Diagnosis not present

## 2023-12-06 DIAGNOSIS — M1611 Unilateral primary osteoarthritis, right hip: Secondary | ICD-10-CM | POA: Diagnosis not present

## 2023-12-06 DIAGNOSIS — G8929 Other chronic pain: Secondary | ICD-10-CM | POA: Diagnosis not present

## 2023-12-06 NOTE — Patient Instructions (Signed)
 Tylenol  [phone] mg 2-3 times a day for pain relief   - Use meloxicam  15 mg daily as needed for pain.  Recommend limiting chronic NSAIDs to 1-2 doses per week to prevent long-term side effects.  Call for refill if needed   As needed follow up if you would like to discuss injections or therapy

## 2024-01-03 ENCOUNTER — Other Ambulatory Visit: Payer: Self-pay | Admitting: Family Medicine

## 2024-01-03 DIAGNOSIS — R002 Palpitations: Secondary | ICD-10-CM

## 2024-01-04 ENCOUNTER — Ambulatory Visit (INDEPENDENT_AMBULATORY_CARE_PROVIDER_SITE_OTHER): Admitting: Family Medicine

## 2024-01-04 ENCOUNTER — Ambulatory Visit: Admitting: Family Medicine

## 2024-01-04 VITALS — BP 122/62 | HR 66 | Temp 98.0°F | Ht 71.0 in | Wt 177.6 lb

## 2024-01-04 DIAGNOSIS — R110 Nausea: Secondary | ICD-10-CM

## 2024-01-04 NOTE — Progress Notes (Signed)
 Subjective:    Patient ID: Joel Bauer, male    DOB: Aug 13, 1933, 89 y.o.   MRN: [id number]  Wt Readings from Last 3 Encounters:  01/04/24 177 lb 9.6 oz (80.6 kg)  12/06/23 180 lb (81.6 kg)  11/06/23 180 lb (81.6 kg)   Patient has been dealing with intermittent nausea for the last 2 weeks.  He states that it started suddenly while he was out for dinner with his son.  He became extremely nauseated and went home and was experiencing dry heaves.  For the last 2 weeks, he has been having nausea on a daily basis.  Typically can last a few hours.  Will come and go.  He denies any vomiting.  He denies any constipation.  He is having 2-3 bowel movements a day.  He denies any melena or hematochezia.  He denies any abdominal pain.  He denies any ulcer-like pain or right upper quadrant pain heartburn.  He is no longer taking meloxicam .  He denies any specific triggers that he can pinpoint.  It seems to come and go and will.  He denies any chest pain or shortness of breath or fevers.  He denies any jaundice.  He denies any dysuria urgency.   Past Medical History:  Diagnosis Date   Adenomatous colon polyp 2001   Allergy    hay fever type   Anemia    as a child    Aortic regurgitation    a. mild by echo 03/2015   Arthritis    mild condition   Cancer (HCC)    hx of skin cancer    Chest pain 03/31/2015   Chronic kidney disease 2018   kidney stones removed twice   Depression    Diverticulitis large intestine 04/19/2015   Diverticulitis of colon - recurrent/persistent 12/29/2014   Diverticulosis of colon (without mention of hemorrhage) 2005   GERD (gastroesophageal reflux disease) long time   take medication.   Heart murmur    History of kidney stones    History of skin cancer    Hyperlipidemia    Hypertension several years   never seriously high   Hypertensive heart disease    LLQ pain 11/26/2014   Macular degeneration    left eye   Orthostasis    Pleurisy    Premature  atrial contractions    Prostatitis    PVC's (premature ventricular contractions)    Recurrent colitis due to Clostridium difficile    Retinal hemorrhage    SCC (squamous cell carcinoma) 11/13/2018   RIGHT FOREARM CX3 5FU   Shingles    Small bowel obstruction (HCC) 05/31/2016   Squamous cell carcinoma of skin 07/23/2017   SCC IN SITU LEFT UPPER NOSE BRIDGE CX3 5FU CAUTERY   Past Surgical History:  Procedure Laterality Date   CATARACTS REMOVED     COLON SURGERY  2019+/_   COLONOSCOPY     COLONOSCOPY N/A 06/15/2014   Procedure: COLONOSCOPY;  Surgeon: [name], MD;  Location: [facility] ENDOSCOPY;  Service: Endoscopy;  Laterality: N/A;   CYSTOSCOPY WITH RETROGRADE PYELOGRAM, URETEROSCOPY AND STENT PLACEMENT Right 10/18/2013   Procedure: CYSTOSCOPY WITH RIGHT RETROGRADE/RIGHT URETEROSCOPY, STONE BASKETRY,  AND STENT PLACEMENT RIGHT;  Surgeon: Ricardo Likens, MD;  Location: [facility] ORS;  Service: Urology;  Laterality: Right;   EYE SURGERY  2017?   3 injections to left eye for macular degeneration    FMT  06/19/2014   LAPAROSCOPIC LOW ANTERIOR RESECTION N/A 04/19/2015  Procedure: LAPAROSCOPIC ASSISTED  LOW ANTERIOR RESECTION, splenic flexure mobilization;  Surgeon: Elon Pacini, MD;  Location: [facility] ORS;  Service: General;  Laterality: N/A;   LITHOTRIPSY     TONSILLECTOMY     Current Outpatient Medications on File Prior to Visit  Medication Sig Dispense Refill   amLODipine  (NORVASC ) 10 MG tablet Take 1 tablet (10 mg total) by mouth daily. 90 tablet 2   fluticasone  (FLONASE ) 50 MCG/ACT nasal spray Place 1 spray into both nostrils daily as needed for allergies or rhinitis.     hydroxypropyl methylcellulose / hypromellose (ISOPTO TEARS / GONIOVISC) 2.5 % ophthalmic solution Place 1 drop into both eyes 3 (three) times daily as needed for dry eyes.     meloxicam  (MOBIC ) 15 MG tablet Take 1 tablet (15 mg total) by mouth daily. 30 tablet 0   metoprolol  succinate (TOPROL -XL) 25 MG 24 hr tablet TAKE 1  TABLET BY MOUTH EVERY DAY 90 tablet 0   multivitamin-lutein (OCUVITE-LUTEIN) CAPS capsule Take 1 capsule by mouth daily.     omeprazole  (PRILOSEC) 40 MG capsule TAKE 1 CAPSULE(40 MG) BY MOUTH DAILY 90 capsule 0   primidone  (MYSOLINE ) 50 MG tablet TAKE 1 TABLET(50 MG) BY MOUTH THREE TIMES DAILY 270 tablet 3   triamterene -hydrochlorothiazide (DYAZIDE) 37.5-25 MG capsule TAKE 1 CAPSULE BY MOUTH EVERY DAY 90 capsule 3   No current facility-administered medications on file prior to visit.   Allergies  Allergen Reactions   Rosuvastatin  Other (See Comments)    Pt reports causes muscle cramps in legs   Social History   Socioeconomic History   Marital status: Widowed    Spouse name: Not on file   Number of children: 1   Years of education: Not on file   Highest education level: Not on file  Occupational History   Occupation: Retired   Tobacco Use   Smoking status: Never   Smokeless tobacco: Never  Vaping Use   Vaping status: Never Used  Substance and Sexual Activity   Alcohol  use: No    Alcohol /week: 0.0 standard drinks of alcohol    Drug use: No   Sexual activity: Not Currently  Other Topics Concern   Not on file  Social History Narrative   He is retired, widowed and has 1 child.   He does not use alcohol  tobacco or drugs   Social Drivers of Corporate investment banker Strain: Low Risk  (05/17/2023)   Overall Financial Resource Strain (CARDIA)    Difficulty of Paying Living Expenses: Not hard at all  Food Insecurity: Low Risk  (10/19/2023)   Received from [facility]   Hunger Vital Sign    Within the past 12 months, you worried that your food would run out before you got money to buy more: Never true    Within the past 12 months, the food you bought just didn't last and you didn't have money to get more. : Never true  Transportation Needs: No Transportation Needs (10/19/2023)   Received from [employer]    In the past 12 months, has lack of reliable  transportation kept you from medical appointments, meetings, work or from getting things needed for daily living? : No  Physical Activity: Insufficiently Active (05/17/2023)   Exercise Vital Sign    Days of Exercise per Week: 4 days    Minutes of Exercise per Session: 30 min  Stress: No Stress Concern Present (05/17/2023)   [employer] of Occupational Health - Occupational Stress Questionnaire  Feeling of Stress : Not at all  Social Connections: Moderately Isolated (05/17/2023)   Social Connection and Isolation Panel    Frequency of Communication with Friends and Family: More than three times a week    Frequency of Social Gatherings with Friends and Family: Twice a week    Attends Religious Services: 1 to 4 times per year    Active Member of [employer] or Organizations: No    Attends Banker Meetings: Never    Marital Status: Widowed  Intimate Partner Violence: Not At Risk (05/17/2023)   Humiliation, Afraid, Rape, and Kick questionnaire    Fear of Current or Ex-Partner: No    Emotionally Abused: No    Physically Abused: No    Sexually Abused: No      Review of Systems  All other systems reviewed and are negative.      Objective:   Physical Exam Vitals reviewed.  Constitutional:      General: He is not in acute distress.    Appearance: Normal appearance. He is well-developed and normal weight. He is not ill-appearing, toxic-appearing or diaphoretic.  Neck:     Vascular: No carotid bruit or JVD.  Cardiovascular:     Rate and Rhythm: Normal rate and regular rhythm.     Heart sounds: Murmur heard.     No friction rub. No gallop.  Pulmonary:     Effort: Pulmonary effort is normal. No respiratory distress.     Breath sounds: Normal breath sounds. No stridor. No wheezing, rhonchi or rales.  Chest:     Chest wall: No tenderness.  Abdominal:     General: Abdomen is flat. Bowel sounds are normal. There is no distension. There are no signs of injury.     Palpations:  Abdomen is soft. There is no hepatomegaly, splenomegaly, mass or pulsatile mass.     Tenderness: There is no abdominal tenderness.  Musculoskeletal:     Cervical back: Neck supple. No rigidity or tenderness.  Lymphadenopathy:     Cervical: No cervical adenopathy.  Neurological:     Mental Status: He is alert.           Assessment & Plan:  Nausea - Plan: CBC with Differential/Platelet, Comprehensive metabolic panel with GFR, Lipase , Abdominal exam is completely benign.  I am not certain.  Differential diagnosis includes gastritis versus gastroparesis versus biliary dyskinesia versus partial gastric outlet obstruction.  Try increasing omeprazole  to 40 mg twice daily and eating a bland diet for the next 2 weeks.  Meanwhile monitor any clues with lab work including a CBC, CMP, and lipase.  If labs are abnormal, consider imaging of the abdomen and pelvis.  If labs are normal and symptoms do not improve, consider EGD versus gastric  emptying study versus CT scan

## 2024-01-05 LAB — LIPASE: Lipase: 22 U/L (ref 7–60)

## 2024-01-05 LAB — COMPREHENSIVE METABOLIC PANEL WITH GFR
AG Ratio: 2 (calc) (ref 1.0–2.5)
ALT: 36 U/L (ref 9–46)
AST: 27 U/L (ref 10–35)
Albumin: 4.5 g/dL (ref 3.6–5.1)
Alkaline phosphatase (APISO): 61 U/L (ref 35–144)
BUN: 16 mg/dL (ref 7–25)
CO2: 29 mmol/L (ref 20–32)
Calcium: 9.6 mg/dL (ref 8.6–10.3)
Chloride: 95 mmol/L — ABNORMAL LOW (ref 98–110)
Creat: 0.95 mg/dL (ref 0.70–1.22)
Globulin: 2.2 g/dL (ref 1.9–3.7)
Glucose, Bld: 133 mg/dL — ABNORMAL HIGH (ref 65–99)
Potassium: 3.6 mmol/L (ref 3.5–5.3)
Sodium: 137 mmol/L (ref 135–146)
Total Bilirubin: 0.6 mg/dL (ref 0.2–1.2)
Total Protein: 6.7 g/dL (ref 6.1–8.1)
eGFR: 77 mL/min/1.73m2 (ref >=60)

## 2024-01-05 LAB — CBC WITH DIFFERENTIAL/PLATELET
Absolute Lymphocytes: 1548 {cells}/uL (ref 850–3900)
Absolute Monocytes: 382 {cells}/uL (ref 200–950)
Basophils Absolute: 58 {cells}/uL (ref 0–200)
Basophils Relative: 1.1 %
Eosinophils Absolute: 180 {cells}/uL (ref 15–500)
Eosinophils Relative: 3.4 %
HCT: 46.8 % (ref 38.5–50.0)
Hemoglobin: 15.7 g/dL (ref 13.2–17.1)
MCH: 31 pg (ref 27.0–33.0)
MCHC: 33.5 g/dL (ref 32.0–36.0)
MCV: 92.3 fL (ref 80.0–100.0)
MPV: 10.5 fL (ref 7.5–12.5)
Monocytes Relative: 7.2 %
Neutro Abs: 3132 {cells}/uL (ref 1500–7800)
Neutrophils Relative %: 59.1 %
Platelets: 191 Thousand/uL (ref 140–400)
RBC: 5.07 Million/uL (ref 4.20–5.80)
RDW: 13.9 % (ref 11.0–15.0)
Total Lymphocyte: 29.2 %
WBC: 5.3 Thousand/uL (ref 3.8–10.8)

## 2024-01-07 ENCOUNTER — Ambulatory Visit: Payer: Self-pay | Admitting: Family Medicine

## 2024-01-15 ENCOUNTER — Encounter: Payer: Self-pay | Admitting: Family Medicine

## 2024-01-15 ENCOUNTER — Ambulatory Visit (INDEPENDENT_AMBULATORY_CARE_PROVIDER_SITE_OTHER): Admitting: Family Medicine

## 2024-01-15 VITALS — BP 136/64 | HR 70 | Temp 97.5°F | Ht 71.0 in | Wt 178.0 lb

## 2024-01-15 DIAGNOSIS — R110 Nausea: Secondary | ICD-10-CM | POA: Diagnosis not present

## 2024-01-15 DIAGNOSIS — K219 Gastro-esophageal reflux disease without esophagitis: Secondary | ICD-10-CM | POA: Diagnosis not present

## 2024-01-15 DIAGNOSIS — R1013 Epigastric pain: Secondary | ICD-10-CM

## 2024-01-15 MED ORDER — OMEPRAZOLE 40 MG PO CPDR: 40.0000 mg | DELAYED_RELEASE_CAPSULE | Freq: Two times a day (BID) | ORAL | 3 refills | Status: DC

## 2024-01-15 NOTE — Progress Notes (Signed)
 Subjective:    Patient ID: Joel Bauer, male    DOB: May 14, 1933, 89 y.o.   MRN: [id number]  Wt Readings from Last 3 Encounters:  01/04/24 177 lb 9.6 oz (80.6 kg)  12/06/23 180 lb (81.6 kg)  11/06/23 180 lb (81.6 kg)  01/04/24 Patient has been dealing with intermittent nausea for the last 2 weeks.  He states that it started suddenly while he was out for dinner with his son.  He became extremely nauseated and went home and was experiencing dry heaves.  For the last 2 weeks, he has been having nausea on a daily basis.  Typically can last a few hours.  Will come and go.  He denies any vomiting.  He denies any constipation.  He is having 2-3 bowel movements a day.  He denies any melena or hematochezia.  He denies any abdominal pain.  He denies any ulcer-like pain or right upper quadrant pain heartburn.  He is no longer taking meloxicam .  He denies any specific triggers that he can pinpoint.  It seems to come and go and will.  He denies any chest pain or shortness of breath or fevers.  He denies any jaundice.  He denies any dysuria urgency.  At that time, my plan was: Abdominal exam is completely benign.  I am not certain.  Differential diagnosis includes gastritis versus gastroparesis versus biliary dyskinesia versus partial gastric outlet obstruction.  Try increasing omeprazole  to 40 mg twice daily and eating a bland diet for the next 2 weeks.  Meanwhile monitor any clues with lab work including a CBC, CMP, and lipase.  If labs are abnormal, consider imaging of the abdomen and pelvis.  If labs are normal and symptoms do not improve, consider EGD versus gastric  emptying study versus CT scan Patient's most recent labs are listed below.   Office Visit on 01/04/2024  Component Date Value Ref Range Status   WBC 01/04/2024 5.3  3.8 - 10.8 Thousand/uL Final   RBC 01/04/2024 5.07  4.20 - 5.80 Million/uL Final   Hemoglobin 01/04/2024 15.7  13.2 - 17.1 g/dL Final   HCT 89/82/7974 46.8  38.5 -  50.0 % Final   MCV 01/04/2024 92.3  80.0 - 100.0 fL Final   MCH 01/04/2024 31.0  27.0 - 33.0 pg Final   MCHC 01/04/2024 33.5  32.0 - 36.0 g/dL Final   Comment: For adults, a slight decrease in the calculated MCHC value (in the range of 30 to 32 g/dL) is most likely not clinically significant; however, it should be interpreted with caution in correlation with other red cell parameters and the patient's clinical condition.    RDW 01/04/2024 13.9  11.0 - 15.0 % Final   Platelets 01/04/2024 191  140 - 400 Thousand/uL Final   MPV 01/04/2024 10.5  7.5 - 12.5 fL Final   Neutro Abs 01/04/2024 3,132  1,500 - 7,800 cells/uL Final   Absolute Lymphocytes 01/04/2024 1,548  850 - 3,900 cells/uL Final   Absolute Monocytes 01/04/2024 382  200 - 950 cells/uL Final   Eosinophils Absolute 01/04/2024 180  15 - 500 cells/uL Final   Basophils Absolute 01/04/2024 58  0 - 200 cells/uL Final   Neutrophils Relative % 01/04/2024 59.1  % Final   Total Lymphocyte 01/04/2024 29.2  % Final   Monocytes Relative 01/04/2024 7.2  % Final   Eosinophils Relative 01/04/2024 3.4  % Final   Basophils Relative 01/04/2024 1.1  % Final  Glucose, Bld 01/04/2024 133 (H)  65 - 99 mg/dL Final   Comment: .            Fasting reference interval . For someone without known diabetes, a glucose value >125 mg/dL indicates that they may have diabetes and this should be confirmed with a follow-up test. .    BUN 01/04/2024 16  7 - 25 mg/dL Final   Creat 89/82/7974 0.95  0.70 - 1.22 mg/dL Final   eGFR 89/82/7974 77  > OR = 60 mL/min/1.17m2 Final   BUN/Creatinine Ratio 01/04/2024 SEE NOTE:  6 - 22 (calc) Final   Comment:    Not Reported: BUN and Creatinine are within    reference range. .    Sodium 01/04/2024 137  135 - 146 mmol/L Final   Potassium 01/04/2024 3.6  3.5 - 5.3 mmol/L Final   Chloride 01/04/2024 95 (L)  98 - 110 mmol/L Final   CO2 01/04/2024 29  20 - 32 mmol/L Final   Calcium  01/04/2024 9.6  8.6 - 10.3 mg/dL Final    Total Protein 01/04/2024 6.7  6.1 - 8.1 g/dL Final   Albumin 89/82/7974 4.5  3.6 - 5.1 g/dL Final   Globulin 89/82/7974 2.2  1.9 - 3.7 g/dL (calc) Final   AG Ratio 01/04/2024 2.0  1.0 - 2.5 (calc) Final   Total Bilirubin 01/04/2024 0.6  0.2 - 1.2 mg/dL Final   Alkaline phosphatase (APISO) 01/04/2024 61  35 - 144 U/L Final   AST 01/04/2024 27  10 - 35 U/L Final   ALT 01/04/2024 36  9 - 46 U/L Final   Lipase 01/04/2024 22  7 - 60 U/L Final   Labs are unremarkable.  01/15/24 My working suspicion is that the patient has dyspepsia and nausea due to likely gastritis.  Since increasing Prilosec to 40 mg twice daily, the patient states that the nausea has improved about 60%.  He has only been on the medication now for 2 weeks.  He denies any melena.  He denies any hematochezia.  He denies any constipation.  He denies any vomiting or hematemesis.  He denies any right upper quadrant pain.  He denies any stabbing abdominal pain.  He denies any chest pain or shortness of breath.  He denies any fevers or chills.  He still has intermittent attacks of nausea that seem to come and go at random however they are becoming less frequent by approximately 60% and he is feeling better.   Past Medical History:  Diagnosis Date   Adenomatous colon polyp 2001   Allergy    hay fever type   Anemia    as a child    Aortic regurgitation    a. mild by echo 03/2015   Arthritis    mild condition   Cancer (HCC)    hx of skin cancer    Chest pain 03/31/2015   Chronic kidney disease 2018   kidney stones removed twice   Depression    Diverticulitis large intestine 04/19/2015   Diverticulitis of colon - recurrent/persistent 12/29/2014   Diverticulosis of colon (without mention of hemorrhage) 2005   GERD (gastroesophageal reflux disease) long time   take medication.   Heart murmur    History of kidney stones    History of skin cancer    Hyperlipidemia    Hypertension several years   never seriously high    Hypertensive heart disease    LLQ pain 11/26/2014   Macular degeneration    left eye  Orthostasis    Pleurisy    Premature atrial contractions    Prostatitis    PVC's (premature ventricular contractions)    Recurrent colitis due to Clostridium difficile    Retinal hemorrhage    SCC (squamous cell carcinoma) 11/13/2018   RIGHT FOREARM CX3 5FU   Shingles    Small bowel obstruction (HCC) 05/31/2016   Squamous cell carcinoma of skin 07/23/2017   SCC IN SITU LEFT UPPER NOSE BRIDGE CX3 5FU CAUTERY   Past Surgical History:  Procedure Laterality Date   CATARACTS REMOVED     COLON SURGERY  2019+/_   COLONOSCOPY     COLONOSCOPY N/A 06/15/2014   Procedure: COLONOSCOPY;  Surgeon: [name], MD;  Location: [facility] ENDOSCOPY;  Service: Endoscopy;  Laterality: N/A;   CYSTOSCOPY WITH RETROGRADE PYELOGRAM, URETEROSCOPY AND STENT PLACEMENT Right 10/18/2013   Procedure: CYSTOSCOPY WITH RIGHT RETROGRADE/RIGHT URETEROSCOPY, STONE BASKETRY,  AND STENT PLACEMENT RIGHT;  Surgeon: Ricardo Likens, MD;  Location: [facility];  Service: Urology;  Laterality: Right;   EYE SURGERY  2017?   3 injections to left eye for macular degeneration    FMT  06/19/2014   LAPAROSCOPIC LOW ANTERIOR RESECTION N/A 04/19/2015   Procedure: LAPAROSCOPIC ASSISTED  LOW ANTERIOR RESECTION, splenic flexure mobilization;  Surgeon: Elon Pacini, MD;  Location: [facility];  Service: General;  Laterality: N/A;   LITHOTRIPSY     TONSILLECTOMY     Current Outpatient Medications on File Prior to Visit  Medication Sig Dispense Refill   amLODipine  (NORVASC ) 10 MG tablet Take 1 tablet (10 mg total) by mouth daily. 90 tablet 2   fluticasone  (FLONASE ) 50 MCG/ACT nasal spray Place 1 spray into both nostrils daily as needed for allergies or rhinitis.     hydroxypropyl methylcellulose / hypromellose (ISOPTO TEARS / GONIOVISC) 2.5 % ophthalmic solution Place 1 drop into both eyes 3 (three) times daily as needed for dry eyes.     metoprolol  succinate  (TOPROL -XL) 25 MG 24 hr tablet TAKE 1 TABLET BY MOUTH EVERY DAY 90 tablet 0   multivitamin-lutein (OCUVITE-LUTEIN) CAPS capsule Take 1 capsule by mouth daily.     omeprazole  (PRILOSEC) 40 MG capsule TAKE 1 CAPSULE(40 MG) BY MOUTH DAILY 90 capsule 0   primidone  (MYSOLINE ) 50 MG tablet TAKE 1 TABLET(50 MG) BY MOUTH THREE TIMES DAILY 270 tablet 3   triamterene -hydrochlorothiazide (DYAZIDE) 37.5-25 MG capsule TAKE 1 CAPSULE BY MOUTH EVERY DAY 90 capsule 3   No current facility-administered medications on file prior to visit.   Allergies  Allergen Reactions   Rosuvastatin  Other (See Comments)    Pt reports causes muscle cramps in legs   Social History   Socioeconomic History   Marital status: Widowed    Spouse name: Not on file   Number of children: 1   Years of education: Not on file   Highest education level: Not on file  Occupational History   Occupation: Retired   Tobacco Use   Smoking status: Never   Smokeless tobacco: Never  Vaping Use   Vaping status: Never Used  Substance and Sexual Activity   Alcohol  use: No    Alcohol /week: 0.0 standard drinks of alcohol    Drug use: No   Sexual activity: Not Currently  Other Topics Concern   Not on file  Social History Narrative   He is retired, widowed and has 1 child.   He does not use alcohol  tobacco or drugs   Social Drivers of Corporate Investment Banker Strain: Low Risk  (  05/17/2023)   Overall Financial Resource Strain (CARDIA)    Difficulty of Paying Living Expenses: Not hard at all  Food Insecurity: Low Risk  (10/19/2023)   Received from [facility]   Hunger Vital Sign    Within the past 12 months, you worried that your food would run out before you got money to buy more: Never true    Within the past 12 months, the food you bought just didn't last and you didn't have money to get more. : Never true  Transportation Needs: No Transportation Needs (10/19/2023)   Received from [employer]    In the past 12  months, has lack of reliable transportation kept you from medical appointments, meetings, work or from getting things needed for daily living? : No  Physical Activity: Insufficiently Active (05/17/2023)   Exercise Vital Sign    Days of Exercise per Week: 4 days    Minutes of Exercise per Session: 30 min  Stress: No Stress Concern Present (05/17/2023)   [employer] of Occupational Health - Occupational Stress Questionnaire    Feeling of Stress : Not at all  Social Connections: Moderately Isolated (05/17/2023)   Social Connection and Isolation Panel    Frequency of Communication with Friends and Family: More than three times a week    Frequency of Social Gatherings with Friends and Family: Twice a week    Attends Religious Services: 1 to 4 times per year    Active Member of [employer] or Organizations: No    Attends Banker Meetings: Never    Marital Status: Widowed  Intimate Partner Violence: Not At Risk (05/17/2023)   Humiliation, Afraid, Rape, and Kick questionnaire    Fear of Current or Ex-Partner: No    Emotionally Abused: No    Physically Abused: No    Sexually Abused: No      Review of Systems  All other systems reviewed and are negative.      Objective:   Physical Exam Vitals reviewed.  Constitutional:      General: He is not in acute distress.    Appearance: Normal appearance. He is well-developed and normal weight. He is not ill-appearing, toxic-appearing or diaphoretic.  Neck:     Vascular: No carotid bruit or JVD.  Cardiovascular:     Rate and Rhythm: Normal rate and regular rhythm.     Heart sounds: Murmur heard.     No friction rub. No gallop.  Pulmonary:     Effort: Pulmonary effort is normal. No respiratory distress.     Breath sounds: Normal breath sounds. No stridor. No wheezing, rhonchi or rales.  Chest:     Chest wall: No tenderness.  Abdominal:     General: Abdomen is flat. Bowel sounds are normal. There is no distension. There are no  signs of injury.     Palpations: Abdomen is soft. There is no hepatomegaly, splenomegaly, mass or pulsatile mass.     Tenderness: There is no abdominal tenderness.  Musculoskeletal:     Cervical back: Neck supple. No rigidity or tenderness.  Lymphadenopathy:     Cervical: No cervical adenopathy.  Neurological:     Mental Status: He is alert.           Assessment & Plan:  Dyspepsia  Gastroesophageal reflux disease without esophagitis - Plan: omeprazole  (PRILOSEC) 40 MG capsule  Nausea I suspect the patient has dyspepsia and nausea related to gastritis.  I would like to complete 6 weeks of twice  daily Prilosec to allow the irritation to heal.  Reassess in 2 to 3 weeks.  If nausea is not improving at that point we will schedule the patient for CAT scan.  I will start the referral process now to get it on the books but if it is improving we can cancel the CAT scan.  The CAT scan will be done to rule out underlying malignancies.  Other possible causes could be biliary dyskinesia.  Consider GI consultation for EGD if not improving

## 2024-01-31 ENCOUNTER — Telehealth: Payer: Self-pay | Admitting: Family Medicine

## 2024-01-31 DIAGNOSIS — D2262 Melanocytic nevi of left upper limb, including shoulder: Secondary | ICD-10-CM | POA: Diagnosis not present

## 2024-01-31 DIAGNOSIS — D044 Carcinoma in situ of skin of scalp and neck: Secondary | ICD-10-CM | POA: Diagnosis not present

## 2024-01-31 DIAGNOSIS — L570 Actinic keratosis: Secondary | ICD-10-CM | POA: Diagnosis not present

## 2024-01-31 DIAGNOSIS — D492 Neoplasm of unspecified behavior of bone, soft tissue, and skin: Secondary | ICD-10-CM | POA: Diagnosis not present

## 2024-01-31 DIAGNOSIS — L821 Other seborrheic keratosis: Secondary | ICD-10-CM | POA: Diagnosis not present

## 2024-01-31 DIAGNOSIS — L814 Other melanin hyperpigmentation: Secondary | ICD-10-CM | POA: Diagnosis not present

## 2024-01-31 DIAGNOSIS — D225 Melanocytic nevi of trunk: Secondary | ICD-10-CM | POA: Diagnosis not present

## 2024-01-31 NOTE — Telephone Encounter (Signed)
 Copied from CRM #[id number]. Topic: Clinical - Medical Advice >> Jan 31, 2024 11:49 AM Hadassah PARAS wrote: Reason for CRM: Pt saw PCP two weeks ago and wanted to Provide update on condition: medication given for nausea is not working as well as pt would like to. He does have a CT scan scheduled and would like to know if he should continue regimen in the meanwhile. Please advise [phone]

## 2024-02-01 ENCOUNTER — Other Ambulatory Visit: Payer: Self-pay

## 2024-02-01 DIAGNOSIS — R1013 Epigastric pain: Secondary | ICD-10-CM

## 2024-02-01 DIAGNOSIS — R110 Nausea: Secondary | ICD-10-CM

## 2024-02-01 MED ORDER — ONDANSETRON HCL 4 MG PO TABS: 4.0000 mg | ORAL_TABLET | Freq: Three times a day (TID) | ORAL | 0 refills | Status: DC | PRN

## 2024-02-01 NOTE — Telephone Encounter (Signed)
 Copied from CRM #[id number]. Topic: Clinical - Medical Advice >> Jan 31, 2024 11:49 AM Hadassah PARAS wrote: Reason for CRM: Pt saw PCP two weeks ago and wanted to Provide update on condition: medication given for nausea is not working as well as pt would like to. He does have a CT scan scheduled and would like to know if he should continue regimen in the meanwhile. Please advise [phone]

## 2024-02-07 ENCOUNTER — Ambulatory Visit
Admission: RE | Admit: 2024-02-07 | Discharge: 2024-02-07 | Disposition: A | Discharge status: Home / Self Care | Source: Ambulatory Visit | Attending: Family Medicine | Admitting: Family Medicine

## 2024-02-07 ENCOUNTER — Other Ambulatory Visit: Payer: Self-pay

## 2024-02-07 DIAGNOSIS — R110 Nausea: Secondary | ICD-10-CM

## 2024-02-07 DIAGNOSIS — K219 Gastro-esophageal reflux disease without esophagitis: Secondary | ICD-10-CM

## 2024-02-07 DIAGNOSIS — R1032 Left lower quadrant pain: Secondary | ICD-10-CM

## 2024-02-07 DIAGNOSIS — R1013 Epigastric pain: Secondary | ICD-10-CM

## 2024-02-07 MED ORDER — IOPAMIDOL (ISOVUE-300) INJECTION 61%
100.0000 mL | Freq: Once | INTRAVENOUS | Status: AC | PRN
  Administered 2024-02-07: 100 mL via INTRAVENOUS

## 2024-02-07 MED ORDER — METOCLOPRAMIDE HCL 5 MG PO TABS: 5.0000 mg | ORAL_TABLET | Freq: Three times a day (TID) | ORAL | 0 refills | Status: AC | PRN

## 2024-02-13 ENCOUNTER — Ambulatory Visit: Payer: Self-pay | Admitting: Family Medicine

## 2024-02-28 ENCOUNTER — Encounter (HOSPITAL_BASED_OUTPATIENT_CLINIC_OR_DEPARTMENT_OTHER): Payer: Self-pay | Admitting: Cardiology

## 2024-03-05 DIAGNOSIS — H353132 Nonexudative age-related macular degeneration, bilateral, intermediate dry stage: Secondary | ICD-10-CM | POA: Diagnosis not present

## 2024-03-05 DIAGNOSIS — Z961 Presence of intraocular lens: Secondary | ICD-10-CM | POA: Diagnosis not present

## 2024-03-18 ENCOUNTER — Telehealth: Payer: Self-pay

## 2024-03-18 NOTE — Telephone Encounter (Signed)
 Copied from CRM [id number]. Topic: Clinical - Medication Question >> Mar 18, 2024  4:16 PM Tysheama G wrote: Reason for CRM: Patient wants to know if Dr.[name] can prescribe him that nausea medication again. Callback number [phone]

## 2024-03-19 ENCOUNTER — Other Ambulatory Visit: Payer: Self-pay

## 2024-03-19 DIAGNOSIS — R1013 Epigastric pain: Secondary | ICD-10-CM

## 2024-03-19 DIAGNOSIS — R110 Nausea: Secondary | ICD-10-CM

## 2024-03-19 MED ORDER — ONDANSETRON HCL 4 MG PO TABS: 4.0000 mg | ORAL_TABLET | Freq: Three times a day (TID) | ORAL | 0 refills | Status: AC | PRN

## 2024-04-04 ENCOUNTER — Other Ambulatory Visit: Payer: Self-pay | Admitting: Family Medicine

## 2024-04-04 DIAGNOSIS — R002 Palpitations: Secondary | ICD-10-CM

## 2024-04-07 ENCOUNTER — Ambulatory Visit: Admitting: Internal Medicine

## 2024-04-07 ENCOUNTER — Encounter: Payer: Self-pay | Admitting: Internal Medicine

## 2024-04-07 VITALS — BP 126/60 | HR 60 | Ht 71.0 in | Wt 178.2 lb

## 2024-04-07 DIAGNOSIS — R110 Nausea: Secondary | ICD-10-CM | POA: Diagnosis not present

## 2024-04-07 DIAGNOSIS — R1013 Epigastric pain: Secondary | ICD-10-CM

## 2024-04-07 NOTE — Progress Notes (Signed)
 "       Joel Bauer 89 y.o. 24-Mar-1933 [id number]  Assessment & Plan:   Encounter Diagnoses  Name Primary?   Nausea without vomiting Yes   Dyspepsia       Nausea and dyspepsia Chronic, intermittent symptoms of mild to moderate severity with unclear etiology. Unremarkable lab and imaging workup. Lack of response to acid suppression and antiemetics. Further evaluation needed to exclude significant gastric pathology. - Ordered upper endoscopy (EGD) to assess gastric mucosa and rule out significant pathology.  Suspected gastritis Empirically treated with high-dose omeprazole  without improvement. H pylori infection possible - Offered stool test for infectious causes as an alternative to endoscopy, but proceeded with EGD as the primary diagnostic step given preference and new GI symptoms in elderly man, think this is a better diagnostic approach          Subjective:   Chief Complaint:  HPI Discussed the use of AI scribe software for clinical note transcription with the patient, who gave verbal consent to proceed.   Joel Bauer is an 89 year old male who presents with persistent nausea and dyspepsia.  He has previously been evaluated by Dr. [name], his primary care provider and I have reviewed those notes and studies as outlined below.  Nausea and Dyspepsia: - Abrupt onset of daily nausea and loose stools beginning in early October 2025 - Nausea occurs nearly every day, with intermittent severity; some days are worse (e.g., Friday prior to visit), others milder - No vomiting, but dry heaves occurred on the first day - No prior similar episodes - No clear triggers or patterns; symptoms not associated with emotional stress - No early satiety, dysphagia, food impaction, abdominal pain, headaches, vertigo, visual disturbances, or sleep disturbance - No limitation of daily activities; continues to eat normally - No unintentional weight loss; recent 6-7 pound weight loss was  intentional due to dietary changes  Therapeutic Response: - Omeprazole  at double dose for several weeks without improvement - Metoclopramide , ondansetron , and over-the-counter antiemetics provided no significant relief - Currently taking primidone  for tremor and triamterene ; timing of initiation unclear  Diagnostic Evaluation: - CT abdomen/pelvis with contrast (February 12, 2024) showed hepatic hemangiomas, no other significant findings - Blood work (October 2025) including comprehensive metabolic panel and complete blood count unremarkable except for mildly elevated glucose - No evidence of ongoing or progressive weight loss since January 04, 2024     No previous EGD  Wt Readings from Last 3 Encounters:  04/07/24 178 lb 4 oz (80.9 kg)  01/15/24 178 lb (80.7 kg)  01/04/24 177 lb 9.6 oz (80.6 kg)    Allergies[1] Active Medications[2] Past Medical History:  Diagnosis Date   Adenomatous colon polyp 2001   Allergy    hay fever type   Anemia    as a child    Aortic regurgitation    a. mild by echo 03/2015   Arthritis    mild condition   Cancer (HCC)    hx of skin cancer    Chest pain 03/31/2015   Chronic kidney disease 2018   kidney stones removed twice   Depression    Diverticulitis large intestine 04/19/2015   Diverticulitis of colon - recurrent/persistent 12/29/2014   Diverticulosis of colon (without mention of hemorrhage) 2005   GERD (gastroesophageal reflux disease) long time   take medication.   Heart murmur    History of kidney stones    History of skin cancer    Hyperlipidemia    Hypertension  several years   never seriously high   Hypertensive heart disease    LLQ pain 11/26/2014   Macular degeneration    left eye   Orthostasis    Pleurisy    Premature atrial contractions    Prostatitis    PVC's (premature ventricular contractions)    Recurrent colitis due to Clostridium difficile    Retinal hemorrhage    SCC (squamous cell carcinoma) 11/13/2018   RIGHT  FOREARM CX3 5FU   Shingles    Small bowel obstruction (HCC) 05/31/2016   Squamous cell carcinoma of skin 07/23/2017   SCC IN SITU LEFT UPPER NOSE BRIDGE CX3 5FU CAUTERY   Past Surgical History:  Procedure Laterality Date   CATARACTS REMOVED     COLON SURGERY  2019+/_   COLONOSCOPY     COLONOSCOPY N/A 06/15/2014   Procedure: COLONOSCOPY;  Surgeon: [name], MD;  Location: [facility];  Service: Endoscopy;  Laterality: N/A;   CYSTOSCOPY WITH RETROGRADE PYELOGRAM, URETEROSCOPY AND STENT PLACEMENT Right 10/18/2013   Procedure: CYSTOSCOPY WITH RIGHT RETROGRADE/RIGHT URETEROSCOPY, STONE BASKETRY,  AND STENT PLACEMENT RIGHT;  Surgeon: Ricardo Likens, MD;  Location: [facility] ORS;  Service: Urology;  Laterality: Right;   EYE SURGERY  2017?   3 injections to left eye for macular degeneration    FMT  06/19/2014   LAPAROSCOPIC LOW ANTERIOR RESECTION N/A 04/19/2015   Procedure: LAPAROSCOPIC ASSISTED  LOW ANTERIOR RESECTION, splenic flexure mobilization;  Surgeon: Elon Pacini, MD;  Location: [facility] ORS;  Service: General;  Laterality: N/A;   LITHOTRIPSY     TONSILLECTOMY     Social History   Social History Narrative   He is retired, widowed and has 1 child.   He does not use alcohol  tobacco or drugs   family history includes Breast cancer in his mother; Cancer in his father; Colon cancer in his father; Colon polyps in his father; Heart disease in his mother; Kidney disease in his sister.   Review of Systems As per HPI otherwise negative  Objective:   Physical Exam @BP  126/60   Pulse 60   Ht 5' 11 (1.803 m)   Wt 178 lb 4 oz (80.9 kg)   SpO2 95%   BMI 24.86 kg/m @  General:  NAD Eyes:   anicteric Lungs:  clear Heart::  S1S2 no rubs, murmurs or gallops Abdomen:  soft and nontender, BS+, no HSM/mass Ext:   no edema, cyanosis or clubbing Neuro:  Alert and oriented x 3   Data Reviewed:  See HPI and assessment plan    [1]  Allergies Allergen Reactions   Rosuvastatin  Other  (See Comments)    Pt reports causes muscle cramps in legs  [2]  Current Meds  Medication Sig   amLODipine  (NORVASC ) 10 MG tablet Take 1 tablet (10 mg total) by mouth daily.   fluticasone  (FLONASE ) 50 MCG/ACT nasal spray Place 1 spray into both nostrils daily as needed for allergies or rhinitis.   hydroxypropyl methylcellulose / hypromellose (ISOPTO TEARS / GONIOVISC) 2.5 % ophthalmic solution Place 1 drop into both eyes 3 (three) times daily as needed for dry eyes.   metoprolol  succinate (TOPROL -XL) 25 MG 24 hr tablet TAKE 1 TABLET BY MOUTH EVERY DAY   multivitamin-lutein (OCUVITE-LUTEIN) CAPS capsule Take 1 capsule by mouth daily.   omeprazole  (PRILOSEC) 40 MG capsule Take 1 capsule (40 mg total) by mouth 2 (two) times daily.   ondansetron  (ZOFRAN ) 4 MG tablet Take 1 tablet (4 mg total) by mouth every 8 (eight) hours  as needed for nausea or vomiting.   primidone  (MYSOLINE ) 50 MG tablet TAKE 1 TABLET(50 MG) BY MOUTH THREE TIMES DAILY   triamterene -hydrochlorothiazide (DYAZIDE) 37.5-25 MG capsule TAKE 1 CAPSULE BY MOUTH EVERY DAY   "

## 2024-04-07 NOTE — Patient Instructions (Signed)
 You have been scheduled for an endoscopy. Please follow written instructions given to you at your visit today.  If you use inhalers (even only as needed), please bring them with you on the day of your procedure.  If you take any of the following medications, they will need to be adjusted prior to your procedure:   DO NOT TAKE 7 DAYS PRIOR TO TEST- Trulicity (dulaglutide) Ozempic, Wegovy (semaglutide) Mounjaro, Zepbound (tirzepatide) Bydureon Bcise (exanatide extended release)  DO NOT TAKE 1 DAY PRIOR TO YOUR TEST Rybelsus (semaglutide) Adlyxin (lixisenatide) Victoza (liraglutide) Byetta (exanatide) _______________________________________________________________  _______________________________________________________  If your blood pressure at your visit was 140/90 or greater, please contact your primary care physician to follow up on this.  _______________________________________________________  If you are age 50 or older, your body mass index should be between 23-30. Your Body mass index is 24.86 kg/m. If this is out of the aforementioned range listed, please consider follow up with your Primary Care Provider.  If you are age 44 or younger, your body mass index should be between 19-25. Your Body mass index is 24.86 kg/m. If this is out of the aformentioned range listed, please consider follow up with your Primary Care Provider.   ________________________________________________________  The [facility] providers would like to encourage you to use MYCHART to communicate with providers for non-urgent requests or questions.  Due to long hold times on the telephone, sending your provider a message by [facility] may be a faster and more efficient way to get a response.  Please allow 48 business hours for a response.  Please remember that this is for non-urgent requests.  _______________________________________________________  [facility] is using a team-based approach to  care.  Your team is made up of your doctor and two to three APPS. Our APPS (Nurse Practitioners and Physician Assistants) work with your physician to ensure care continuity for you. They are fully qualified to address your health concerns and develop a treatment plan. They communicate directly with your gastroenterologist to care for you. Seeing the Advanced Practice Practitioners on your physician's team can help you by facilitating care more promptly, often allowing for earlier appointments, access to diagnostic testing, procedures, and other specialty referrals.

## 2024-04-09 ENCOUNTER — Encounter: Payer: Self-pay | Admitting: Internal Medicine

## 2024-04-09 ENCOUNTER — Ambulatory Visit: Admitting: Internal Medicine

## 2024-04-09 VITALS — BP 131/61 | HR 60 | Resp 17

## 2024-04-09 DIAGNOSIS — K2950 Unspecified chronic gastritis without bleeding: Secondary | ICD-10-CM | POA: Diagnosis present

## 2024-04-09 DIAGNOSIS — R1013 Epigastric pain: Secondary | ICD-10-CM

## 2024-04-09 DIAGNOSIS — R110 Nausea: Secondary | ICD-10-CM

## 2024-04-09 DIAGNOSIS — K317 Polyp of stomach and duodenum: Secondary | ICD-10-CM | POA: Diagnosis not present

## 2024-04-09 DIAGNOSIS — T478X5A Adverse effect of other agents primarily affecting gastrointestinal system, initial encounter: Secondary | ICD-10-CM

## 2024-04-09 MED ORDER — SODIUM CHLORIDE 0.9 % IV SOLN: 500.0000 mL | Freq: Once | INTRAVENOUS | Status: DC

## 2024-04-09 NOTE — Op Note (Signed)
 [name] Patient Name: Joel Bauer Procedure Date: 04/09/2024 1:42 PM MRN: [id number] Endoscopist: [name] , MD, [id number] Age: 89 Referring MD:  Date of Birth: [birth year] Gender: Male Account #: [id number] Procedure:                Upper GI endoscopy Indications:              Dyspepsia, Nausea Medicines:                Monitored Anesthesia Care Procedure:                Pre-Anesthesia Assessment:                           - Prior to the procedure, a History and Physical                            was performed, and patient medications and                            allergies were reviewed. The patient's tolerance of                            previous anesthesia was also reviewed. The risks                            and benefits of the procedure and the sedation                            options and risks were discussed with the patient.                            All questions were answered, and informed consent                            was obtained. Prior Anticoagulants: The patient has                            taken no anticoagulant or antiplatelet agents. ASA                            Grade Assessment: II - A patient with mild systemic                            disease. After reviewing the risks and benefits,                            the patient was deemed in satisfactory condition to                            undergo the procedure.                           After obtaining informed consent, the endoscope was  passed under direct vision. Throughout the                            procedure, the patient's blood pressure, pulse, and                            oxygen saturations were monitored continuously. The                            Olympus Scope [id number] was introduced through the                            mouth, and advanced to the second part of duodenum.                            The upper GI endoscopy was  accomplished without                            difficulty. The patient tolerated the procedure                            well. Scope In: Scope Out: Findings:                 Patchy mild inflammation characterized by                            congestion (edema), white discoloration and                            erythema was found in the gastric antrum. Biopsies                            were taken with a cold forceps for histology.                            Verification of patient identification for the                            specimen was done. Estimated blood loss was minimal.                           A few diminutive sessile polyps were found in the                            gastric body. Biopsies were taken with a cold                            forceps for histology. Verification of patient                            identification for the specimen was done. Estimated                            blood  loss was minimal.                           Prominent gastric folds were found in the gastric                            fundus and in the gastric body. Biopsies were taken                            with a cold forceps for histology. Verification of                            patient identification for the specimen was done.                            Estimated blood loss was minimal.                           The exam was otherwise without abnormality.                           The cardia and gastric fundus were normal on                            retroflexion. Complications:            No immediate complications. Estimated Blood Loss:     Estimated blood loss was minimal. Impression:               - Gastritis. Biopsied.                           - A few gastric polyps. Biopsied.                           - Enlarged gastric folds. Biopsied.                           - The examination was otherwise normal. Recommendation:           - Patient has a contact number available  for                            emergencies. The signs and symptoms of potential                            delayed complications were discussed with the                            patient. Return to normal activities tomorrow.                            Written discharge instructions were provided to the                            patient.                           -  Resume previous diet.                           - Continue present medications.                           - Await pathology results. [name], MD 04/09/2024 2:34:25 PM This report has been signed electronically.

## 2024-04-09 NOTE — Progress Notes (Signed)
 Called to room to assist during endoscopic procedure.  Patient ID and intended procedure confirmed with present staff. Received instructions for my participation in the procedure from the performing physician.

## 2024-04-09 NOTE — Patient Instructions (Addendum)
 Please read handouts provided. Continue present medications. Await pathology results. Resume previous diet.   YOU HAD AN ENDOSCOPIC PROCEDURE TODAY AT THE [facility]:   Refer to the procedure report that was given to you for any specific questions about what was found during the examination.  If the procedure report does not answer your questions, please call your gastroenterologist to clarify.  If you requested that your care partner not be given the details of your procedure findings, then the procedure report has been included in a sealed envelope for you to review at your convenience later.  YOU SHOULD EXPECT: Some feelings of bloating in the abdomen. Passage of more gas than usual.  Walking can help get rid of the air that was put into your GI tract during the procedure and reduce the bloating. If you had a lower endoscopy (such as a colonoscopy or flexible sigmoidoscopy) you may notice spotting of blood in your stool or on the toilet paper. If you underwent a bowel prep for your procedure, you may not have a normal bowel movement for a few days.  Please Note:  You might notice some irritation and congestion in your nose or some drainage.  This is from the oxygen used during your procedure.  There is no need for concern and it should clear up in a day or so.  SYMPTOMS TO REPORT IMMEDIATELY:  Following upper endoscopy (EGD)  Vomiting of blood or coffee ground material  New chest pain or pain under the shoulder blades  Painful or persistently difficult swallowing  New shortness of breath  Fever of 100F or higher  Black, tarry-looking stools  For urgent or emergent issues, a gastroenterologist can be reached at any hour by calling [phone]. Do not use MyChart messaging for urgent concerns.    DIET:  We do recommend a small meal at first, but then you may proceed to your regular diet.  Drink plenty of fluids but you should avoid alcoholic beverages for 24  hours.  ACTIVITY:  You should plan to take it easy for the rest of today and you should NOT DRIVE or use heavy machinery until tomorrow (because of the sedation medicines used during the test).    FOLLOW UP: Our staff will call the number listed on your records the next business day following your procedure.  We will call around 7:15- 8:00 am to check on you and address any questions or concerns that you may have regarding the information given to you following your procedure. If we do not reach you, we will leave a message.     If any biopsies were taken you will be contacted by phone or by letter within the next 1-3 weeks.  Please call us  at [phone] if you have not heard about the biopsies in 3 weeks.    SIGNATURES/CONFIDENTIALITY: You and/or your care partner have signed paperwork which will be entered into your electronic medical record.  These signatures attest to the fact that that the information above on your After Visit Summary has been reviewed and is understood.  Full responsibility of the confidentiality of this discharge information lies with you and/or your care-partner.There was some inflammation in the stomach that I took biopsies of.  There was some tiny polyps that I biopsied and some of the stomach folds looked enlarged.  I also biopsied them.  I did not see anything worrisome.  You may have an infection and the biopsy should tell us  that.  Once  I see the results I will let you know.  I am away all next week so it may not be until after I returned that you hear about results.  I appreciate the opportunity to care for you.  Lupita CHARLENA Commander, MD, NOLIA

## 2024-04-09 NOTE — Progress Notes (Signed)
 Transferred to PACU via stretcher.  Not responding to stimulation at this time.  VSS upon leaving procedure room.

## 2024-04-09 NOTE — Progress Notes (Signed)
 History and Physical Interval Note:  04/09/2024 1:52 PM  Joel Bauer  has presented today for endoscopic procedure(s), with the diagnosis of  Encounter Diagnoses  Name Primary?   Nausea without vomiting Yes   Dyspepsia   .  The various methods of evaluation and treatment have been discussed with the patient and/or family. After consideration of risks, benefits and other options for treatment, the patient has consented to  the endoscopic procedure(s).   The patient's history has been reviewed, patient examined, no change in status, stable for endoscopic procedure(s).  I have reviewed the patient's chart and labs.  Questions were answered to the patient's satisfaction.     Lupita CHARLENA Commander, MD, NOLIA

## 2024-04-10 ENCOUNTER — Telehealth: Payer: Self-pay

## 2024-04-10 NOTE — Telephone Encounter (Signed)
 Follow up call to pt, lm for pt to call if having any difficulty with normal activities or eating and drinking.  Also to call if any other questions or concerns.

## 2024-04-15 LAB — SURGICAL PATHOLOGY

## 2024-04-25 ENCOUNTER — Ambulatory Visit: Payer: Self-pay | Admitting: Internal Medicine

## 2024-04-25 DIAGNOSIS — K219 Gastro-esophageal reflux disease without esophagitis: Secondary | ICD-10-CM

## 2024-04-25 NOTE — Telephone Encounter (Signed)
 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  I do not think they are related to his nausea.  He does report that antiemetics that he has tried have been beneficial.  He still has some nausea at times.  He is symptomatic but tolerating it.  No unintentional weight loss  I think this is about as good as we can do.  We discussed possibility of using mirtazapine but he generally sleeps okay and at his age I have found that drug makes people very sleepy so we will not use that.  I have instructed him to reduce omeprazole  to 40 mg daily.  Follow-up GI as needed.
